# Patient Record
Sex: Female | Born: 1985 | Race: White | Hispanic: No | Marital: Single | Smoking: Current some day smoker
Health system: Southern US, Community
[De-identification: ages and names within clinical notes are randomized; demographics above are authoritative.]

## PROBLEM LIST (undated history)

## (undated) ENCOUNTER — Inpatient Hospital Stay (HOSPITAL_COMMUNITY): Payer: Self-pay

## (undated) DIAGNOSIS — F329 Major depressive disorder, single episode, unspecified: Secondary | ICD-10-CM

## (undated) DIAGNOSIS — J40 Bronchitis, not specified as acute or chronic: Secondary | ICD-10-CM

## (undated) DIAGNOSIS — N921 Excessive and frequent menstruation with irregular cycle: Secondary | ICD-10-CM

## (undated) DIAGNOSIS — O163 Unspecified maternal hypertension, third trimester: Secondary | ICD-10-CM

## (undated) DIAGNOSIS — N390 Urinary tract infection, site not specified: Secondary | ICD-10-CM

## (undated) DIAGNOSIS — O99320 Drug use complicating pregnancy, unspecified trimester: Secondary | ICD-10-CM

## (undated) DIAGNOSIS — R102 Pelvic and perineal pain: Secondary | ICD-10-CM

## (undated) DIAGNOSIS — Z8742 Personal history of other diseases of the female genital tract: Secondary | ICD-10-CM

## (undated) DIAGNOSIS — F32A Depression, unspecified: Secondary | ICD-10-CM

## (undated) DIAGNOSIS — K219 Gastro-esophageal reflux disease without esophagitis: Secondary | ICD-10-CM

## (undated) DIAGNOSIS — IMO0002 Reserved for concepts with insufficient information to code with codable children: Secondary | ICD-10-CM

## (undated) DIAGNOSIS — F1910 Other psychoactive substance abuse, uncomplicated: Secondary | ICD-10-CM

## (undated) DIAGNOSIS — N809 Endometriosis, unspecified: Secondary | ICD-10-CM

## (undated) DIAGNOSIS — N946 Dysmenorrhea, unspecified: Secondary | ICD-10-CM

## (undated) HISTORY — DX: Pelvic and perineal pain: R10.2

## (undated) HISTORY — DX: Drug use complicating pregnancy, unspecified trimester: O99.320

## (undated) HISTORY — DX: Unspecified maternal hypertension, third trimester: O16.3

## (undated) HISTORY — DX: Personal history of other diseases of the female genital tract: Z87.42

## (undated) HISTORY — DX: Dysmenorrhea, unspecified: N94.6

## (undated) HISTORY — DX: Other psychoactive substance abuse, uncomplicated: F19.10

## (undated) HISTORY — DX: Excessive and frequent menstruation with irregular cycle: N92.1

---
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
## 2014-01-25 DIAGNOSIS — IMO0002 Reserved for concepts with insufficient information to code with codable children: Secondary | ICD-10-CM

## 2014-01-25 HISTORY — DX: Reserved for concepts with insufficient information to code with codable children: IMO0002

## 2014-04-19 ENCOUNTER — Encounter (HOSPITAL_COMMUNITY): Payer: Self-pay | Admitting: Emergency Medicine

## 2014-04-19 ENCOUNTER — Emergency Department (HOSPITAL_COMMUNITY)
Admission: EM | Admit: 2014-04-19 | Discharge: 2014-04-19 | Disposition: A | Discharge status: Home / Self Care | Payer: Medicaid Other | Attending: Emergency Medicine | Admitting: Emergency Medicine

## 2014-04-19 DIAGNOSIS — Z720 Tobacco use: Secondary | ICD-10-CM | POA: Diagnosis not present

## 2014-04-19 DIAGNOSIS — R102 Pelvic and perineal pain: Secondary | ICD-10-CM | POA: Insufficient documentation

## 2014-04-19 DIAGNOSIS — Z8742 Personal history of other diseases of the female genital tract: Secondary | ICD-10-CM | POA: Insufficient documentation

## 2014-04-19 DIAGNOSIS — Z91040 Latex allergy status: Secondary | ICD-10-CM | POA: Diagnosis not present

## 2014-04-19 DIAGNOSIS — Z8719 Personal history of other diseases of the digestive system: Secondary | ICD-10-CM | POA: Diagnosis not present

## 2014-04-19 DIAGNOSIS — Z3202 Encounter for pregnancy test, result negative: Secondary | ICD-10-CM | POA: Insufficient documentation

## 2014-04-19 HISTORY — DX: Gastro-esophageal reflux disease without esophagitis: K21.9

## 2014-04-19 HISTORY — DX: Endometriosis, unspecified: N80.9

## 2014-04-19 LAB — CBC WITH DIFFERENTIAL/PLATELET
BASOS ABS: 0 10*3/uL (ref 0.0–0.1)
Basophils Relative: 0 % (ref 0–1)
EOS ABS: 0.1 10*3/uL (ref 0.0–0.7)
Eosinophils Relative: 0 % (ref 0–5)
HCT: 42.8 % (ref 36.0–46.0)
Hemoglobin: 14.8 g/dL (ref 12.0–15.0)
Lymphocytes Relative: 30 % (ref 12–46)
Lymphs Abs: 3.5 10*3/uL (ref 0.7–4.0)
MCH: 31.2 pg (ref 26.0–34.0)
MCHC: 34.6 g/dL (ref 30.0–36.0)
MCV: 90.3 fL (ref 78.0–100.0)
Monocytes Absolute: 1 10*3/uL (ref 0.1–1.0)
Monocytes Relative: 8 % (ref 3–12)
NEUTROS PCT: 62 % (ref 43–77)
Neutro Abs: 7.1 10*3/uL (ref 1.7–7.7)
PLATELETS: 290 10*3/uL (ref 150–400)
RBC: 4.74 MIL/uL (ref 3.87–5.11)
RDW: 13.2 % (ref 11.5–15.5)
WBC: 11.6 10*3/uL — AB (ref 4.0–10.5)

## 2014-04-19 LAB — COMPREHENSIVE METABOLIC PANEL
ALBUMIN: 4.7 g/dL (ref 3.5–5.2)
ALT: 15 U/L (ref 0–35)
ANION GAP: 11 (ref 5–15)
AST: 19 U/L (ref 0–37)
Alkaline Phosphatase: 66 U/L (ref 39–117)
BUN: 21 mg/dL (ref 6–23)
CHLORIDE: 103 mmol/L (ref 96–112)
CO2: 24 mmol/L (ref 19–32)
Calcium: 8.8 mg/dL (ref 8.4–10.5)
Creatinine, Ser: 1.26 mg/dL — ABNORMAL HIGH (ref 0.50–1.10)
GFR calc non Af Amer: 58 mL/min — ABNORMAL LOW (ref >90)
GFR, EST AFRICAN AMERICAN: 67 mL/min — AB (ref >90)
GLUCOSE: 75 mg/dL (ref 70–99)
Potassium: 3.7 mmol/L (ref 3.5–5.1)
Sodium: 138 mmol/L (ref 135–145)
TOTAL PROTEIN: 8 g/dL (ref 6.0–8.3)
Total Bilirubin: 0.7 mg/dL (ref 0.3–1.2)

## 2014-04-19 LAB — URINALYSIS, ROUTINE W REFLEX MICROSCOPIC
Bilirubin Urine: NEGATIVE
Glucose, UA: NEGATIVE mg/dL
HGB URINE DIPSTICK: NEGATIVE
Ketones, ur: NEGATIVE mg/dL
Leukocytes, UA: NEGATIVE
Nitrite: NEGATIVE
PROTEIN: NEGATIVE mg/dL
Specific Gravity, Urine: 1.026 (ref 1.005–1.030)
Urobilinogen, UA: 0.2 mg/dL (ref 0.0–1.0)
pH: 5.5 (ref 5.0–8.0)

## 2014-04-19 LAB — WET PREP, GENITAL
Trich, Wet Prep: NONE SEEN
Yeast Wet Prep HPF POC: NONE SEEN

## 2014-04-19 LAB — POC URINE PREG, ED: PREG TEST UR: NEGATIVE

## 2014-04-19 LAB — LIPASE, BLOOD: Lipase: 30 U/L (ref 11–59)

## 2014-04-19 MED ORDER — IBUPROFEN 800 MG PO TABS: 800.0000 mg | ORAL_TABLET | Freq: Three times a day (TID) | ORAL | Status: DC

## 2014-04-19 MED ORDER — OXYCODONE-ACETAMINOPHEN 5-325 MG PO TABS: 2.0000 | ORAL_TABLET | ORAL | Status: DC | PRN

## 2014-04-19 NOTE — ED Notes (Signed)
Pt with Hx of endometriosis c/o lower abdominal pain flare up x several days, states pain is too much to handle currently.

## 2014-04-19 NOTE — Progress Notes (Signed)
  CARE MANAGEMENT ED NOTE 04/19/2014  Patient:  Joyce Ward,Joyce Ward   Account Number:  [id number]  Date Initiated:  04/19/2014  Documentation initiated by:  Edd ArbourGIBBS,Meer Reindl  Subjective/Objective Assessment:   29 yr [place] with Hx of endometriosis c/o lower abdominal pain flare up x several days, states pain is too much to handle currently.                  Subjective/Objective Assessment Detail:   no pcp listed  Pt [place] Reports making [place] DSS aware of her move and [place] already [place] booklet of [place] providers to choose from     Action/Plan:   CM spoke with pt about insurance coverage, pcps, [place] resources see notes below   Action/Plan Detail:   Anticipated DC Date:  04/19/2014     Status Recommendation to Physician:   Result of Recommendation:    Other ED Services  Consult Working Plan    DC Planning Services  Other  Outpatient Services - Pt will follow up  PCP issues    Choice offered to / List presented to:            Status of service:  Completed, signed off  ED Comments:   ED Comments Detail:  CM spoke with pt who [place]cp. CM discussed and provided written information for self pay pcps, importance of pcp for f/u care, www.needymeds.org, www.goodrx.com, discounted pharmacies and other [place] resources such as [facility], [employer], affordable care act,  financial assistance, DSS and  health department  Reviewed resources for [place] self pay pcps like [facility]ovita [facility] [place], family medicine at [address], [facility], general medical clinics, [facility] urgent care plus others, medication resources, [facility] out patient pharmacies and housing Pt voiced understanding and appreciation of resources provided

## 2014-04-19 NOTE — Discharge Instructions (Signed)
Pelvic Pain Female pelvic pain can be caused by many different things and start from a variety of places. Pelvic pain refers to pain that is located in the lower half of the abdomen and between your hips. The pain may occur over a short period of time (acute) or may be reoccurring (chronic). The cause of pelvic pain may be related to disorders affecting the female reproductive organs (gynecologic), but it may also be related to the bladder, kidney stones, an intestinal complication, or muscle or skeletal problems. Getting help right away for pelvic pain is important, especially if there has been severe, sharp, or a sudden onset of unusual pain. It is also important to get help right away because some types of pelvic pain can be life threatening.  CAUSES  Below are only some of the causes of pelvic pain. The causes of pelvic pain can be in one of several categories.   Gynecologic.  Pelvic inflammatory disease.  Sexually transmitted infection.  Ovarian cyst or a twisted ovarian ligament (ovarian torsion).  Uterine lining that grows outside the uterus (endometriosis).  Fibroids, cysts, or tumors.  Ovulation.  Pregnancy.  Pregnancy that occurs outside the uterus (ectopic pregnancy).  Miscarriage.  Labor.  Abruption of the placenta or ruptured uterus.  Infection.  Uterine infection (endometritis).  Bladder infection.  Diverticulitis.  Miscarriage related to a uterine infection (septic abortion).  Bladder.  Inflammation of the bladder (cystitis).  Kidney stone(s).  Gastrointestinal.  Constipation.  Diverticulitis.  Neurologic.  Trauma.  Feeling pelvic pain because of mental or emotional causes (psychosomatic).  Cancers of the bowel or pelvis. EVALUATION  Your caregiver will want to take a careful history of your concerns. This includes recent changes in your health, a careful gynecologic history of your periods (menses), and a sexual history. Obtaining your family  history and medical history is also important. Your caregiver may suggest a pelvic exam. A pelvic exam will help identify the location and severity of the pain. It also helps in the evaluation of which organ system may be involved. In order to identify the cause of the pelvic pain and be properly treated, your caregiver may order tests. These tests may include:   A pregnancy test.  Pelvic ultrasonography.  An X-ray exam of the abdomen.  A urinalysis or evaluation of vaginal discharge.  Blood tests. HOME CARE INSTRUCTIONS   Only take over-the-counter or prescription medicines for pain, discomfort, or fever as directed by your caregiver.   Rest as directed by your caregiver.   Eat a balanced diet.   Drink enough fluids to make your urine clear or pale yellow, or as directed.   Avoid sexual intercourse if it causes pain.   Apply warm or cold compresses to the lower abdomen depending on which one helps the pain.   Avoid stressful situations.   Keep a journal of your pelvic pain. Write down when it started, where the pain is located, and if there are things that seem to be associated with the pain, such as food or your menstrual cycle.  Follow up with your caregiver as directed.  SEEK MEDICAL CARE IF:  Your medicine does not help your pain.  You have abnormal vaginal discharge. SEEK IMMEDIATE MEDICAL CARE IF:   You have heavy bleeding from the vagina.   Your pelvic pain increases.   You feel light-headed or faint.   You have chills.   You have pain with urination or blood in your urine.   You have uncontrolled diarrhea   or vomiting.   °· You have a fever or persistent symptoms for more than 3 days. °· You have a fever and your symptoms suddenly get worse.   °· You are being physically or sexually abused.   °MAKE SURE YOU: °· Understand these instructions. °· Will watch your condition. °· Will get help if you are not doing well or get worse. °Document Released:  12/09/2003 Document Revised: 05/28/2013 Document Reviewed: 05/03/2011 °ExitCare® Patient Information ©2015 [employer]. This information is not intended to replace advice given to you by your health care provider. Make sure you discuss any questions you have with your health care provider. ° °Emergency Department Resource Guide °1) Find a Doctor and Pay Out of Pocket °Although you won't have to find out who is covered by your insurance plan, it is a good idea to ask around and get recommendations. You will then need to call the office and see if the doctor you have chosen will accept you as a new patient and what types of options they offer for patients who are self-pay. Some doctors offer discounts or will set up payment plans for their patients who do not have insurance, but you will need to ask so you aren't surprised when you get to your appointment. ° °2) Contact Your Local Health Department °Not all health departments have doctors that can see patients for sick visits, but many do, so it is worth a call to see if yours does. If you don't know where your local health department is, you can check in your phone book. The CDC also has a tool to help you locate your state's health department, and many state websites also have listings of all of their local health departments. ° °3) Find a Walk-in Clinic °If your illness is not likely to be very severe or complicated, you may want to try a walk in clinic. These are popping up all over the country in pharmacies, drugstores, and shopping centers. They're usually staffed by nurse practitioners or physician assistants that have been trained to treat common illnesses and complaints. They're usually fairly quick and inexpensive. However, if you have serious medical issues or chronic medical problems, these are probably not your best option. ° °No Primary Care Doctor: °- Call Health Connect at  832-8000 - they can help you locate a primary care doctor that  accepts your  insurance, provides certain services, etc. °- Physician Referral Service- 1-800-533-3463 ° °Chronic Pain Problems: °Organization         Address  Phone   Notes  °[facility]  ([phone] Patients need to be referred by their primary care doctor.  ° °Medication Assistance: °Organization         Address  Phone   Notes  °[place] [facility] [address] °[place], [place] [address] [phone]) [phone] --Must be a resident of [place] °-- Must have NO insurance coverage whatsoever (no Medicaid/ Medicare, etc.) °-- The pt. MUST have a primary care doctor that directs their care regularly and follows them in the community °  °MedAssist  (866) 331-1348   °United Way  (888) 892-1162   ° °Agencies that provide inexpensive medical care: °Organization         Address  Phone   Notes  °[facility]  (336) [phone]   °[facility]    ([phone]) [phone]   °[facility] [address] °[place], [place] [address] [phone]) [phone]   °  [facility] [address] [place] [phone]   Planned Parenthood    (480)622-0091   [facility]    [phone]   [place] [address], [place] Phone:  [phone], Fax:  [phone] Hours of Operation:  9 am - 6 pm, M-F.  Also accepts Medicaid/Medicare and self-pay.  [facility] for Center [place], [address], [place] Phone: [phone], Fax: [phone]. Hours of Operation:  8:30 am - 5:30 pm, M-F.  Also accepts Medicaid and self-pay.  [facility] [place] [address] [place] Phone: [phone]   [place], [place], [place] [phone], Ext. [phone] Mondays & Thursdays: 7-9 AM.  First 15 patients are seen on a first come, first serve basis.    [place] Providers:  Organization          Address  Phone   Notes  [facility] [address],  [phone] Also accepts self-pay patients.  [facility] [address], [place]  [phone]   [place], [address], [place] [phone]   [facility] [address], [place] [phone]   Lucianne Lei [address], [place]   [phone] Only accepts [place] Access [place] patients after they have their name applied to their card.   Self-Pay (no insurance) in [facility]:  Organization         Address  Phone   Notes  Sickle Cell Patients, [facility] [place] [phone]   [facility] [place] [phone]   [facility] [place]  [place], [place], [place] [phone]   [facility]/Dr. Osei-Bonsu  [address] [place] or [address], [place] [phone] Phone number for both [place] and [place] locations is the same.  [facility] [address] [place] [phone]   [facility] [address] [place] or [address] [phone]   [facility] [address] [place] [phone], phone; [phone], fax Sees patients 1st and 3rd Saturday of every month.  Must not qualify for public or private insurance (i.e. Medicaid, Medicare, [place] Health Choice, Veterans' Benefits)  Household income should be no more than 200% of the poverty level The clinic cannot treat you if you are pregnant or think you are pregnant  Sexually transmitted diseases are not treated at the clinic.    Dental Care: Organization         Address  Phone  Notes  [facility] Department of [facility] [phone] Accepts children up to age 63 who are enrolled in [place] or [place]; pregnant women with a Medicaid card; and children who have applied for Medicaid or [place] Health Choice, but were declined, whose parents can pay a reduced fee at time of service.  [facility] Department of [facility]  [address], [place] ([phone] Accepts children up to age 25 who are enrolled in [place] or [place]; pregnant women with a Medicaid card; and  children who have applied for Medicaid or [place] Health Choice, but were declined, whose parents can pay a reduced fee at time of service.  [place] [facility] PRO[facility]  [place] [phone] Patients are seen by appointment only. Walk-ins are not accepted. [place] will see patients 64 years of age and older. Monday - Tuesday (8am-5pm) Most Wednesdays (8:30-5pm) $30 per visit, cash only  [facility] Adult Dental Access PROGRAM  [address], [facility] [phone] Patients are seen by appointment only. Walk-ins are not accepted. [place] will see patients 81 years of age and older. One Wednesday Evening (Monthly: Volunteer Based).  $30 per visit, cash only  [place]  [phone] for adults; Children under age 25, call Graduate Pediatric Dentistry at [phone]. Children aged 30-14, please call [phone] to request a pediatric application.  Dental services are provided in all areas of dental care including fillings, crowns and bridges, complete and partial dentures, implants, gum treatment, root canals, and extractions. Preventive care is also provided. Treatment is provided to both adults and children. Patients are selected via a lottery and there is often a waiting list.   [facility] [address], [place]  [phone] [email or web address]   [facility] [address] [place], [place]  [phone], Ext. [phone] Second and Fourth Thursday of each month, opens at 6:30 AM; Clinic ends at 9 AM.  Patients are seen on a first-come first-served basis, and a limited number are seen during each clinic.   [facility]  [address] Hillard Danker [place], [place] [phone]   Eligibility Requirements You must have lived in [place], [place], or [place] counties for at least the last three months.   You cannot be eligible for state or federal sponsored [employer], including [employer], [place], or [employer].   You generally cannot be eligible for healthcare insurance through your employer.    How to apply: Eligibility screenings are held every Tuesday and Wednesday afternoon from 1:00 pm until 4:00 pm. You do not need an appointment for the interview!  [facility] [address] [place], [place]   [place]  [place] Department  [place]  [phone]    Behavioral Health Resources in the Community: Intensive Outpatient Programs Organization         Address  Phone  Notes  Vass Berger. [address] [place], [place] [phone]   [facility] Out[facility] [address], [place], [place]   [facility]: [facility] [address] [place], [place]   [place] [address]  [place], [place] or [phone]   Substance Abuse Resources Organization         Address  Phone  Notes  Alcohol and Drug Services  484-667-2026   [place]  [phone]   [facility] [place]   Chinita Pester  [phone]   Residential & Outpatient Substance Abuse Program  919-177-9951   Psychological Services Organization         Address  Phone  Notes  [facility]  [place]  [phone]    [place] [address], [place] or [phone]    Mobile Crisis Teams Organization  Address  Phone  Notes  [facility], Mobile Crisis Care Unit  [phone]   [facility]  [address] [place], [place] [phone]   [facility]sch [address] [place] [phone]    Self-Help/Support Groups Organization         Address  Phone             Notes  [facility] [place] - variety of support groups  336- [phone] Call for more information  Narcotics Anonymous (NA), [facility] [address], [place]  2 meetings at this location   Statisticianesidential Treatment Programs Organization         Address  Phone  Notes  [facility] [address],    [place]  [phone]   [facility]  [address], [place] [phone], [place], [place] [phone]   [facility] [address], [place] [phone] Admissions: 8am-3pm M-F  [facility] [address]    [place], [place] [phone]   [facility] [address], [place], [place] [phone]   [facility] [address]  [place], [place] [phone]   [facility] - Intensive Outpatient [address], [place], [place] [phone]   [facility] ([facility] [address]  [place], [place] [phone] or [phone]   [facility] ([facility]) [address] [place], [place] [phone] Accepts Medicaid  [facility] [address]  [place] [phone] Substance Abuse/Addiction Treatment   [facility] Behavioral Health Resources Organization         Address  Phone  Notes  [facility]  [phone]) [phone]   Angie FavaJulie Brannon, PhD [address] [place], [place]   [phone] or [phone]) [phone]   [facility] [place]   [address] [place], [place] [phone]   [facility] [address], [place], [place] [phone]) [phone] Insurance/Medicaid/sponsorship through [facility]  [facility] [address]                                    [place], [place] [phone]) [phone] Therapy/tele-psych/case  [facility] [address].   [place], [place] [phone]    Dr. Lolly MustacheArfeen  [phone]   [facility] [place]  [employer] [facility] 1) [address], [place] 2) [address], [place] 3)  [address], [place] [phone]  [phone](336) [phone]   [facility] Child Abuse Hotline 4094591113(336) [phone] or [phone](336) [phone] (After Hours)     Follow up with GYN for endometriosis. Return for worsening symptoms.

## 2014-04-19 NOTE — ED Provider Notes (Signed)
CSN: [id number]     Arrival date & time 04/19/14  1103 History   First MD Initiated Contact with Patient 04/19/14 1255     Chief Complaint  Patient presents with  . Pelvic Pain     (Consider location/radiation/quality/duration/timing/severity/associated sxs/prior Treatment) Patient is a 29 y.o. female presenting with pelvic pain. The history is provided by the patient. No language interpreter was used.  Pelvic Pain  Joyce Ward is a 29 y.o white female with a history of endometriosis and 2 laproscopic ablations who presents for pelvic pain that began gradually 4 days ago.  She states this is similar to her usual endometriosis pain but she can no longer take the pain.  Pain is 10/10. She has tried ibuprofen and advil for the pain with little relief.  She does not have a gyn and has not had a pelvic exam in several years as far as she can remember. She denies any history of STD's or new sexual partners.  Her LMP was at the beginning of March.   Past Medical History  Diagnosis Date  . Endometriosis   . GERD (gastroesophageal reflux disease)    History reviewed. No pertinent past surgical history. History reviewed. No pertinent family history. History  Substance Use Topics  . Smoking status: Current Some Day Smoker    Types: Cigarettes  . Smokeless tobacco: Not on file  . Alcohol Use: Yes     Comment: occasional   OB History    No data available     Review of Systems  Constitutional: Negative.   Genitourinary: Positive for pelvic pain. Negative for dysuria, frequency and hematuria.  All other systems reviewed and are negative.     Allergies  Codeine; Latex; and Tramadol  Home Medications   Prior to Admission medications   Medication Sig Start Date End Date Taking? Authorizing Provider  ibuprofen (ADVIL,MOTRIN) 800 MG tablet Take 1 tablet (800 mg total) by mouth 3 (three) times daily. 04/19/14   Maryclaire Stoecker Patel-Mills, PA-C  oxyCODONE-acetaminophen (PERCOCET/ROXICET) 5-325 MG  per tablet Take 2 tablets by mouth every 4 (four) hours as needed for severe pain. 04/19/14   Cristianna Cyr Patel-Mills, PA-C   BP 122/76 mmHg  Pulse 77  Temp(Src) 98.2 F (36.8 C) (Oral)  Resp 16  SpO2 98%  LMP 03/07/2014 Physical Exam  Constitutional: She is oriented to person, place, and time. She appears well-developed and well-nourished.  HENT:  Head: Normocephalic and atraumatic.  Eyes: Conjunctivae are normal.  Neck: Normal range of motion. Neck supple.  Cardiovascular: Normal rate, regular rhythm and normal heart sounds.   Pulmonary/Chest: Effort normal and breath sounds normal.  Abdominal: Soft. There is no tenderness.  Genitourinary:  Pelvic exam: Chaperone present No vaginal bleeding or discharge.  No endometriotic lesions noted on the vaginal wall. Bilateral adnexal tenderness to palpation.   Musculoskeletal: Normal range of motion.  Neurological: She is alert and oriented to person, place, and time.  Skin: Skin is warm and dry.  Nursing note and vitals reviewed.   ED Course  Procedures (including critical care time) Labs Review Labs Reviewed  WET PREP, GENITAL - Abnormal; Notable for the following:    Clue Cells Wet Prep HPF POC FEW (*)    WBC, Wet Prep HPF POC FEW (*)    All other components within normal limits  CBC WITH DIFFERENTIAL/PLATELET - Abnormal; Notable for the following:    WBC 11.6 (*)    All other components within normal limits  COMPREHENSIVE METABOLIC PANEL - Abnormal;  Notable for the following:    Creatinine, Ser 1.26 (*)    GFR calc non Af Amer 58 (*)    GFR calc Af Amer 67 (*)    All other components within normal limits  URINALYSIS, ROUTINE W REFLEX MICROSCOPIC - Abnormal; Notable for the following:    APPearance CLOUDY (*)    All other components within normal limits  LIPASE, BLOOD  POC URINE PREG, ED  GC/CHLAMYDIA PROBE AMP ([place])    Imaging Review No results found.   EKG Interpretation None      MDM   Final diagnoses:   Pelvic pain in female   Patient presents for pelvic pain that is similar to her endometriosis pain.  On pelvic exam she has bilateral adnexal tenderness to palpation.  No UTI.  Creatinine is 1.26.  All other labs are normal. Her vitals are stable. She is not pregnant and has no infection.  Her pain is bilateral so ovarian torsion was low on my differential.  This is similar to her previous endometriosis pain.  She is driving so I gave her ibuprofen here and wrote her for percocet and ibuprofen to go home with.  I gave her the resource guide for f/u and recommended seeing GYN for both pap and endometriosis pain.  She agrees with the plan.      Catha Gosselin, PA-C 04/20/14 1026  Tilden Fossa, MD 04/20/14 [phone]

## 2014-04-22 LAB — GC/CHLAMYDIA PROBE AMP (~~LOC~~) NOT AT ARMC
CHLAMYDIA, DNA PROBE: NEGATIVE
Neisseria Gonorrhea: NEGATIVE

## 2014-04-27 ENCOUNTER — Emergency Department (HOSPITAL_COMMUNITY)
Admission: EM | Admit: 2014-04-27 | Discharge: 2014-04-27 | Disposition: A | Discharge status: Home / Self Care | Payer: Medicaid Other | Attending: Emergency Medicine | Admitting: Emergency Medicine

## 2014-04-27 ENCOUNTER — Encounter (HOSPITAL_COMMUNITY): Payer: Self-pay | Admitting: *Deleted

## 2014-04-27 DIAGNOSIS — Z3202 Encounter for pregnancy test, result negative: Secondary | ICD-10-CM | POA: Insufficient documentation

## 2014-04-27 DIAGNOSIS — N809 Endometriosis, unspecified: Secondary | ICD-10-CM | POA: Insufficient documentation

## 2014-04-27 DIAGNOSIS — Z91040 Latex allergy status: Secondary | ICD-10-CM | POA: Insufficient documentation

## 2014-04-27 DIAGNOSIS — Z8719 Personal history of other diseases of the digestive system: Secondary | ICD-10-CM | POA: Diagnosis not present

## 2014-04-27 DIAGNOSIS — R102 Pelvic and perineal pain: Secondary | ICD-10-CM | POA: Diagnosis not present

## 2014-04-27 DIAGNOSIS — Z720 Tobacco use: Secondary | ICD-10-CM | POA: Diagnosis not present

## 2014-04-27 LAB — URINALYSIS, ROUTINE W REFLEX MICROSCOPIC
BILIRUBIN URINE: NEGATIVE
Glucose, UA: NEGATIVE mg/dL
Hgb urine dipstick: NEGATIVE
KETONES UR: 15 mg/dL — AB
Leukocytes, UA: NEGATIVE
NITRITE: NEGATIVE
PROTEIN: NEGATIVE mg/dL
Specific Gravity, Urine: 1.024 (ref 1.005–1.030)
Urobilinogen, UA: 0.2 mg/dL (ref 0.0–1.0)
pH: 6 (ref 5.0–8.0)

## 2014-04-27 LAB — POC URINE PREG, ED: Preg Test, Ur: NEGATIVE

## 2014-04-27 MED ORDER — OXYCODONE-ACETAMINOPHEN 5-325 MG PO TABS: 1.0000 | ORAL_TABLET | ORAL | Status: DC | PRN

## 2014-04-27 MED ORDER — KETOROLAC TROMETHAMINE 60 MG/2ML IM SOLN
60.0000 mg | Freq: Once | INTRAMUSCULAR | Status: AC
  Administered 2014-04-27: 60 mg via INTRAMUSCULAR
  Filled 2014-04-27: qty 2

## 2014-04-27 MED ORDER — IBUPROFEN 800 MG PO TABS: 800.0000 mg | ORAL_TABLET | Freq: Three times a day (TID) | ORAL | Status: DC

## 2014-04-27 NOTE — ED Notes (Signed)
PT ambulated with baseline gait; VSS; A&Ox3; no signs of distress; respirations even and unlabored; skin warm and dry; no questions upon discharge.  

## 2014-04-27 NOTE — ED Notes (Signed)
PA at bedside.

## 2014-04-27 NOTE — ED Notes (Signed)
Pt has bilateral pelvic pain; states has had surgeries in past for endometriosis and this feels like a flair. Has been taking ibuprofen

## 2014-04-27 NOTE — Discharge Instructions (Signed)
Take the prescribed medication as directed. [facility] you may call and schedule appt or you may go in through the ER there.

## 2014-04-27 NOTE — ED Provider Notes (Signed)
CSN: [id number]     Arrival date & time 04/27/14  8295 History   First MD Initiated Contact with Patient 04/27/14 [phone]     Chief Complaint  Patient presents with  . Pelvic Pain     (Consider location/radiation/quality/duration/timing/severity/associated sxs/prior Treatment) Patient is a 29 y.o. female presenting with pelvic pain. The history is provided by the patient and medical records.  Pelvic Pain  This is a 29 y.o. F with PMH significant for endometriosis, GERD, presenting to the ED for pelvic pain. Patient states she was seen at  approx 1 week ago for the same and has been taking the meds but continues having pain.  Patient has a long-standing history of endometriosis and has required to laparoscopic surgeries in the past for this. She states pain is localized across her entire abdomen, but worse in the suprapubic region. She states pain is similar to prior flares, but is somewhat worse than normal. She denies any associated nausea, vomiting, or diarrhea. No urinary symptoms or vaginal complaints. Patient is new to [facility] and is waiting for her change in [place] prior to seeing an OB-GYN.  No fever, chills, sweats.  Past Medical History  Diagnosis Date  . Endometriosis   . GERD (gastroesophageal reflux disease)    History reviewed. No pertinent past surgical history. History reviewed. No pertinent family history. History  Substance Use Topics  . Smoking status: Current Some Day Smoker    Types: Cigarettes  . Smokeless tobacco: Not on file  . Alcohol Use: Yes     Comment: occasional   OB History    No data available     Review of Systems  Genitourinary: Positive for pelvic pain.  All other systems reviewed and are negative.     Allergies  Codeine; Latex; and Tramadol  Home Medications   Prior to Admission medications   Medication Sig Start Date End Date Taking? Authorizing Provider  ibuprofen (ADVIL,MOTRIN) 800 MG tablet Take 1 tablet (800 mg total) by  mouth 3 (three) times daily. 04/19/14   Hanna Patel-Mills, PA-C  oxyCODONE-acetaminophen (PERCOCET/ROXICET) 5-325 MG per tablet Take 2 tablets by mouth every 4 (four) hours as needed for severe pain. 04/19/14   Hanna Patel-Mills, PA-C   BP 116/77 mmHg  Pulse 123  Temp(Src) 99.1 F (37.3 C) (Oral)  Resp 18  SpO2 100%  LMP 03/19/2014   Physical Exam  Constitutional: She is oriented to person, place, and time. She appears well-developed and well-nourished. No distress.  HENT:  Head: Normocephalic and atraumatic.  Mouth/Throat: Oropharynx is clear and moist.  Eyes: Conjunctivae and EOM are normal. Pupils are equal, round, and reactive to light.  Neck: Normal range of motion. Neck supple.  Cardiovascular: Normal rate, regular rhythm and normal heart sounds.   Pulmonary/Chest: Effort normal and breath sounds normal. No respiratory distress. She has no wheezes.  Abdominal: Soft. Bowel sounds are normal. There is tenderness in the suprapubic area. There is no guarding and no CVA tenderness.    Abdomen soft, non-distended, tenderness of suprapubic and pelvic regions without rebound or guarding; no peritonitis  Musculoskeletal: Normal range of motion. She exhibits no edema.  Neurological: She is alert and oriented to person, place, and time.  Skin: Skin is warm and dry. She is not diaphoretic.  Psychiatric: She has a normal mood and affect.  Nursing note and vitals reviewed.   ED Course  Procedures (including critical care time) Labs Review Labs Reviewed  URINALYSIS, ROUTINE W REFLEX MICROSCOPIC - Abnormal; Notable for  the following:    Color, Urine AMBER (*)    Ketones, ur 15 (*)    All other components within normal limits  POC URINE PREG, ED    Imaging Review No results found.   EKG Interpretation None      MDM   Final diagnoses:  Pelvic pain in female   29 year old female with ongoing pelvic pain secondary to her endometriosis. She was seen in the ED 1 week ago and has  been taking pain meds without significant improvement. On exam, she is afebrile and nontoxic in appearance. She does have tenderness of her suprapubic and pelvic regions without peritoneal signs. I have reviewed cultures from pelvic exam, negative for acute infection. Urine pregnancy test today is negative and UA is without signs of infection.  I have discussed possibility of obtaining pelvic ultrasound due to worsening pain, however patient states she would prefer to wait until she sees an OB/GYN to have this done. She is aware there may be abnormalities that require urgent intervention, however she still wishes to wait. She was given Toradol in the ED with significant improvement of her pain. She feels comfortable with discharge home, short supply of pain medications given. Patient was instructed [facility].  Discussed plan with patient, he/she acknowledged understanding and agreed with plan of care.  Return precautions given for new or worsening symptoms.  Garlon HatchetLisa M Temitope Griffing, PA-C 04/27/14 1025  Benjiman CoreNathan Pickering, MD 04/27/14 [phone]

## 2014-04-27 NOTE — ED Notes (Signed)
Pt reports lower abd pain/pelvic pain for days. Denies urinary or vaginal symptoms. Hx of endometriosis.

## 2014-05-08 ENCOUNTER — Telehealth (HOSPITAL_BASED_OUTPATIENT_CLINIC_OR_DEPARTMENT_OTHER): Payer: Self-pay | Admitting: Emergency Medicine

## 2014-05-08 ENCOUNTER — Emergency Department (HOSPITAL_COMMUNITY)
Admission: EM | Admit: 2014-05-08 | Discharge: 2014-05-08 | Disposition: A | Discharge status: Home / Self Care | Payer: Medicaid Other | Attending: Emergency Medicine | Admitting: Emergency Medicine

## 2014-05-08 DIAGNOSIS — Z8742 Personal history of other diseases of the female genital tract: Secondary | ICD-10-CM | POA: Insufficient documentation

## 2014-05-08 DIAGNOSIS — Z8719 Personal history of other diseases of the digestive system: Secondary | ICD-10-CM | POA: Diagnosis not present

## 2014-05-08 DIAGNOSIS — Z720 Tobacco use: Secondary | ICD-10-CM | POA: Insufficient documentation

## 2014-05-08 DIAGNOSIS — R102 Pelvic and perineal pain: Secondary | ICD-10-CM | POA: Diagnosis not present

## 2014-05-08 DIAGNOSIS — Z3202 Encounter for pregnancy test, result negative: Secondary | ICD-10-CM | POA: Diagnosis not present

## 2014-05-08 DIAGNOSIS — Z91040 Latex allergy status: Secondary | ICD-10-CM | POA: Diagnosis not present

## 2014-05-08 LAB — BASIC METABOLIC PANEL
Anion gap: 9 (ref 5–15)
BUN: 11 mg/dL (ref 6–23)
CALCIUM: 8.9 mg/dL (ref 8.4–10.5)
CO2: 23 mmol/L (ref 19–32)
Chloride: 107 mmol/L (ref 96–112)
Creatinine, Ser: 0.69 mg/dL (ref 0.50–1.10)
GFR calc Af Amer: >90 mL/min (ref >90)
GFR calc non Af Amer: >90 mL/min (ref >90)
GLUCOSE: 93 mg/dL (ref 70–99)
Potassium: 3.8 mmol/L (ref 3.5–5.1)
SODIUM: 139 mmol/L (ref 135–145)

## 2014-05-08 LAB — CBC WITH DIFFERENTIAL/PLATELET
Basophils Absolute: 0 10*3/uL (ref 0.0–0.1)
Basophils Relative: 0 % (ref 0–1)
EOS ABS: 0.1 10*3/uL (ref 0.0–0.7)
EOS PCT: 0 % (ref 0–5)
HCT: 42.4 % (ref 36.0–46.0)
Hemoglobin: 14.2 g/dL (ref 12.0–15.0)
LYMPHS ABS: 2.3 10*3/uL (ref 0.7–4.0)
Lymphocytes Relative: 18 % (ref 12–46)
MCH: 31.3 pg (ref 26.0–34.0)
MCHC: 33.5 g/dL (ref 30.0–36.0)
MCV: 93.4 fL (ref 78.0–100.0)
MONO ABS: 0.7 10*3/uL (ref 0.1–1.0)
MONOS PCT: 5 % (ref 3–12)
NEUTROS ABS: 9.9 10*3/uL — AB (ref 1.7–7.7)
NEUTROS PCT: 77 % (ref 43–77)
PLATELETS: 328 10*3/uL (ref 150–400)
RBC: 4.54 MIL/uL (ref 3.87–5.11)
RDW: 14.2 % (ref 11.5–15.5)
WBC: 13 10*3/uL — AB (ref 4.0–10.5)

## 2014-05-08 LAB — URINALYSIS, ROUTINE W REFLEX MICROSCOPIC
Glucose, UA: NEGATIVE mg/dL
HGB URINE DIPSTICK: NEGATIVE
KETONES UR: NEGATIVE mg/dL
Leukocytes, UA: NEGATIVE
Nitrite: NEGATIVE
Protein, ur: NEGATIVE mg/dL
Specific Gravity, Urine: 1.031 — ABNORMAL HIGH (ref 1.005–1.030)
UROBILINOGEN UA: 0.2 mg/dL (ref 0.0–1.0)
pH: 6 (ref 5.0–8.0)

## 2014-05-08 LAB — PREGNANCY, URINE: Preg Test, Ur: NEGATIVE

## 2014-05-08 MED ORDER — OXYCODONE-ACETAMINOPHEN 5-325 MG PO TABS
1.0000 | ORAL_TABLET | Freq: Once | ORAL | Status: AC
  Administered 2014-05-08: 1 via ORAL
  Filled 2014-05-08: qty 1

## 2014-05-08 MED ORDER — IBUPROFEN 800 MG PO TABS: 800.0000 mg | ORAL_TABLET | Freq: Three times a day (TID) | ORAL | Status: DC | PRN

## 2014-05-08 MED ORDER — OXYCODONE-ACETAMINOPHEN 5-325 MG PO TABS
1.0000 | ORAL_TABLET | Freq: Four times a day (QID) | ORAL | Status: DC | PRN
Start: 2014-05-08 — End: 2014-06-23

## 2014-05-08 NOTE — ED Provider Notes (Signed)
CSN: [id number]     Arrival date & time 05/08/14  1116 History   First MD Initiated Contact with Patient 05/08/14 1155     Chief Complaint  Patient presents with  . Pelvic Pain     (Consider location/radiation/quality/duration/timing/severity/associated sxs/prior Treatment) Patient is a 29 y.o. female presenting with pelvic pain. The history is provided by the patient (the pt is complaining of pelvic pain.  she has endometriosis and has an appointment with gyn in 1 month.  motrin and percocet help with the pain).  Pelvic Pain This is a recurrent problem. The current episode started more than 2 days ago. The problem occurs constantly. The problem has not changed since onset.Associated symptoms include abdominal pain. Pertinent negatives include no chest pain and no headaches. Nothing aggravates the symptoms. Relieved by: percocet and motrin. She has tried acetaminophen for the symptoms. The treatment provided moderate relief.    Past Medical History  Diagnosis Date  . Endometriosis   . GERD (gastroesophageal reflux disease)    No past surgical history on file. No family history on file. History  Substance Use Topics  . Smoking status: Current Some Day Smoker    Types: Cigarettes  . Smokeless tobacco: Not on file  . Alcohol Use: Yes     Comment: occasional   OB History    No data available     Review of Systems  Constitutional: Negative for appetite change and fatigue.  HENT: Negative for congestion, ear discharge and sinus pressure.   Eyes: Negative for discharge.  Respiratory: Negative for cough.   Cardiovascular: Negative for chest pain.  Gastrointestinal: Positive for abdominal pain. Negative for diarrhea.  Genitourinary: Positive for pelvic pain. Negative for frequency and hematuria.  Musculoskeletal: Negative for back pain.  Skin: Negative for rash.  Neurological: Negative for seizures and headaches.  Psychiatric/Behavioral: Negative for hallucinations.       Allergies  Codeine; Latex; and Tramadol  Home Medications   Prior to Admission medications   Medication Sig Start Date End Date Taking? Authorizing Provider  ibuprofen (ADVIL,MOTRIN) 800 MG tablet Take 1 tablet (800 mg total) by mouth every 8 (eight) hours as needed for moderate pain. 05/08/14   Bethann BerkshireJoseph Sri Clegg, MD  oxyCODONE-acetaminophen (PERCOCET) 5-325 MG per tablet Take 1 tablet by mouth every 6 (six) hours as needed. 05/08/14   Bethann BerkshireJoseph Grizel Vesely, MD   BP 110/71 mmHg  Pulse 82  Temp(Src) 98 F (36.7 C) (Oral)  Resp 18  SpO2 100%  LMP 03/19/2014 Physical Exam  Constitutional: She is oriented to person, place, and time. She appears well-developed.  HENT:  Head: Normocephalic.  Eyes: Conjunctivae and EOM are normal. No scleral icterus.  Neck: Neck supple. No thyromegaly present.  Cardiovascular: Normal rate and regular rhythm.  Exam reveals no gallop and no friction rub.   No murmur heard. Pulmonary/Chest: No stridor. She has no wheezes. She has no rales. She exhibits no tenderness.  Abdominal: She exhibits no distension. There is tenderness. There is no rebound.  Moderate tender throughout  Musculoskeletal: Normal range of motion. She exhibits no edema.  Lymphadenopathy:    She has no cervical adenopathy.  Neurological: She is oriented to person, place, and time. She exhibits normal muscle tone. Coordination normal.  Skin: No rash noted. No erythema.  Psychiatric: She has a normal mood and affect. Her behavior is normal.    ED Course  Procedures (including critical care time) Labs Review Labs Reviewed  CBC WITH DIFFERENTIAL/PLATELET - Abnormal; Notable for the following:  WBC 13.0 (*)    Neutro Abs 9.9 (*)    All other components within normal limits  URINALYSIS, ROUTINE W REFLEX MICROSCOPIC - Abnormal; Notable for the following:    Color, Urine AMBER (*)    APPearance CLOUDY (*)    Specific Gravity, Urine 1.031 (*)    Bilirubin Urine SMALL (*)    All other  components within normal limits  BASIC METABOLIC PANEL  PREGNANCY, URINE    Imaging Review No results found.   EKG Interpretation None      MDM   Final diagnoses:  Pelvic pain in female    Endometriosis and pelvic pain,  tx with percocet and motrin and follow up with gyn    Bethann Berkshire, MD 05/08/14 1435

## 2014-05-08 NOTE — ED Notes (Signed)
MD notified that patient is in pain .

## 2014-05-08 NOTE — ED Notes (Signed)
Pt states her endometriosis has been flaring up. Having pelvic pain, has an appointment [facility] may 2nd. She states she's been taking ibuprofen but can't stand the pain anymore.

## 2014-05-08 NOTE — Discharge Instructions (Signed)
Follow up with the ob-gyn md as planned

## 2014-05-27 ENCOUNTER — Encounter: Payer: Medicaid Other | Admitting: Obstetrics & Gynecology

## 2014-06-22 ENCOUNTER — Encounter (HOSPITAL_COMMUNITY): Payer: Self-pay | Admitting: Emergency Medicine

## 2014-06-22 ENCOUNTER — Emergency Department (HOSPITAL_COMMUNITY)
Admission: EM | Admit: 2014-06-22 | Discharge: 2014-06-23 | Disposition: A | Discharge status: Home / Self Care | Payer: Medicaid Other | Attending: Emergency Medicine | Admitting: Emergency Medicine

## 2014-06-22 DIAGNOSIS — Z3202 Encounter for pregnancy test, result negative: Secondary | ICD-10-CM | POA: Insufficient documentation

## 2014-06-22 DIAGNOSIS — R102 Pelvic and perineal pain: Secondary | ICD-10-CM | POA: Insufficient documentation

## 2014-06-22 DIAGNOSIS — Z8742 Personal history of other diseases of the female genital tract: Secondary | ICD-10-CM | POA: Insufficient documentation

## 2014-06-22 DIAGNOSIS — Z720 Tobacco use: Secondary | ICD-10-CM | POA: Insufficient documentation

## 2014-06-22 DIAGNOSIS — R000 Tachycardia, unspecified: Secondary | ICD-10-CM | POA: Insufficient documentation

## 2014-06-22 DIAGNOSIS — Z8719 Personal history of other diseases of the digestive system: Secondary | ICD-10-CM | POA: Insufficient documentation

## 2014-06-22 DIAGNOSIS — Z91040 Latex allergy status: Secondary | ICD-10-CM | POA: Diagnosis not present

## 2014-06-22 DIAGNOSIS — R109 Unspecified abdominal pain: Secondary | ICD-10-CM | POA: Diagnosis present

## 2014-06-22 LAB — CBC WITH DIFFERENTIAL/PLATELET
Basophils Absolute: 0 10*3/uL (ref 0.0–0.1)
Basophils Relative: 0 % (ref 0–1)
EOS PCT: 0 % (ref 0–5)
Eosinophils Absolute: 0 10*3/uL (ref 0.0–0.7)
HCT: 39.8 % (ref 36.0–46.0)
HEMOGLOBIN: 13.5 g/dL (ref 12.0–15.0)
Lymphocytes Relative: 48 % — ABNORMAL HIGH (ref 12–46)
Lymphs Abs: 4.6 10*3/uL — ABNORMAL HIGH (ref 0.7–4.0)
MCH: 32.1 pg (ref 26.0–34.0)
MCHC: 33.9 g/dL (ref 30.0–36.0)
MCV: 94.8 fL (ref 78.0–100.0)
MONO ABS: 0.5 10*3/uL (ref 0.1–1.0)
MONOS PCT: 5 % (ref 3–12)
Neutro Abs: 4.5 10*3/uL (ref 1.7–7.7)
Neutrophils Relative %: 47 % (ref 43–77)
Platelets: 389 10*3/uL (ref 150–400)
RBC: 4.2 MIL/uL (ref 3.87–5.11)
RDW: 13.8 % (ref 11.5–15.5)
WBC: 9.7 10*3/uL (ref 4.0–10.5)

## 2014-06-22 LAB — URINALYSIS, ROUTINE W REFLEX MICROSCOPIC
BILIRUBIN URINE: NEGATIVE
Glucose, UA: NEGATIVE mg/dL
Hgb urine dipstick: NEGATIVE
Ketones, ur: NEGATIVE mg/dL
LEUKOCYTES UA: NEGATIVE
NITRITE: NEGATIVE
Protein, ur: NEGATIVE mg/dL
Specific Gravity, Urine: 1.007 (ref 1.005–1.030)
Urobilinogen, UA: 0.2 mg/dL (ref 0.0–1.0)
pH: 7 (ref 5.0–8.0)

## 2014-06-22 LAB — COMPREHENSIVE METABOLIC PANEL
ALBUMIN: 4.7 g/dL (ref 3.5–5.0)
ALT: 15 U/L (ref 14–54)
AST: 14 U/L — ABNORMAL LOW (ref 15–41)
Alkaline Phosphatase: 61 U/L (ref 38–126)
Anion gap: 7 (ref 5–15)
BUN: 10 mg/dL (ref 6–20)
CHLORIDE: 108 mmol/L (ref 101–111)
CO2: 26 mmol/L (ref 22–32)
CREATININE: 0.84 mg/dL (ref 0.44–1.00)
Calcium: 9.1 mg/dL (ref 8.9–10.3)
GFR calc non Af Amer: >60 mL/min (ref >60)
Glucose, Bld: 90 mg/dL (ref 65–99)
Potassium: 4 mmol/L (ref 3.5–5.1)
Sodium: 141 mmol/L (ref 135–145)
TOTAL PROTEIN: 7.6 g/dL (ref 6.5–8.1)
Total Bilirubin: 0.5 mg/dL (ref 0.3–1.2)

## 2014-06-22 LAB — WET PREP, GENITAL
Trich, Wet Prep: NONE SEEN
Yeast Wet Prep HPF POC: NONE SEEN

## 2014-06-22 LAB — POC URINE PREG, ED: PREG TEST UR: NEGATIVE

## 2014-06-22 MED ORDER — OXYCODONE-ACETAMINOPHEN 5-325 MG PO TABS
2.0000 | ORAL_TABLET | Freq: Once | ORAL | Status: AC
  Administered 2014-06-22: 2 via ORAL
  Filled 2014-06-22: qty 2

## 2014-06-22 NOTE — ED Notes (Signed)
Pt c/o low mid abd pain x 1 week. Denies urinary s/s, denies gyn s/s. Denies n/v/d hx of endometriosis

## 2014-06-22 NOTE — ED Provider Notes (Signed)
CSN: [id number]     Arrival date & time 06/22/14  2240 History   First MD Initiated Contact with Patient 06/22/14 2253     Chief Complaint  Patient presents with  . Abdominal Pain     (Consider location/radiation/quality/duration/timing/severity/associated sxs/prior Treatment) Patient is a 29 y.o. female presenting with abdominal pain. The history is provided by the patient and medical records. No language interpreter was used.  Abdominal Pain Associated symptoms: no chest pain, no constipation, no cough, no diarrhea, no dysuria, no fatigue, no fever, no hematuria, no nausea, no shortness of breath and no vomiting      Joyce Ward is a 29 y.o. female  with a hx of endometriosis (with hx of several laparoscopic surgeries), GERD presents to the Emergency Department complaining of gradual, persistent, progressively worsening lower abd pain onset 1 week ago.  Pt reports the pain more suprapubic than anything else.  Pt reports the pain is sharp, constant and rated at a 10/10.  Pt reports the pain radiates to her lower back and bilateral legs. Pt reports similar, but less intense in the past. She reports all previous episodes have been associated with her menses.  LMP: 05/20/14.  Pt reports she has been taking ibuprofen  at 8pm without relief.  Pt reports she is married and sexually active with 1 partner. Pt denies new vaginal discharge.     Nothing makes it better or worse.  Pt denies fever, chills, headache, neck pain, chest pain, N/V/D, weakness, dizziness, syncope, dysuria, hematuria, vaginal discharge. She denies any history of STD's or new sexual partners  [facility] - appointment on June 8th  Past Medical History  Diagnosis Date  . Endometriosis   . GERD (gastroesophageal reflux disease)    History reviewed. No pertinent past surgical history. No family history on file. History  Substance Use Topics  . Smoking status: Current Some Day Smoker    Types:  Cigarettes  . Smokeless tobacco: Not on file  . Alcohol Use: Yes     Comment: occasional   OB History    No data available     Review of Systems  Constitutional: Negative for fever, diaphoresis, appetite change, fatigue and unexpected weight change.  HENT: Negative for mouth sores and trouble swallowing.   Respiratory: Negative for cough, chest tightness, shortness of breath, wheezing and stridor.   Cardiovascular: Negative for chest pain and palpitations.  Gastrointestinal: Negative for nausea, vomiting, abdominal pain, diarrhea, constipation, blood in stool, abdominal distention and rectal pain.  Genitourinary: Positive for pelvic pain. Negative for dysuria, urgency, frequency, hematuria, flank pain and difficulty urinating.  Musculoskeletal: Negative for back pain, neck pain and neck stiffness.  Skin: Negative for rash.  Neurological: Negative for weakness.  Hematological: Negative for adenopathy.  Psychiatric/Behavioral: Negative for confusion.  All other systems reviewed and are negative.     Allergies  Codeine; Latex; and Tramadol  Home Medications   Prior to Admission medications   Medication Sig Start Date End Date Taking? Authorizing Provider  ibuprofen (ADVIL,MOTRIN) 800 MG tablet Take 1 tablet (800 mg total) by mouth every 8 (eight) hours as needed for moderate pain. 05/08/14  Yes Bethann Berkshire, MD  oxyCODONE-acetaminophen (PERCOCET/ROXICET) 5-325 MG per tablet Take 1-2 tablets by mouth every 8 (eight) hours as needed for severe pain. 06/23/14   Dania Marsan, PA-C   BP 117/70 mmHg  Pulse 96  Temp(Src) 98 F (36.7 C) (Oral)  Resp 14  SpO2 97%  LMP 05/23/2014 Physical Exam  Constitutional: She appears well-developed and well-nourished. No distress.  HENT:  Head: Normocephalic and atraumatic.  Mouth/Throat: Oropharynx is clear and moist.  Eyes: Conjunctivae are normal. No scleral icterus.  Neck: Normal range of motion.  Cardiovascular: Normal rate,  regular rhythm, normal heart sounds and intact distal pulses.   No murmur heard. Mild tachycardia  Pulmonary/Chest: Effort normal and breath sounds normal. No respiratory distress. She has no wheezes.  Abdominal: Soft. Bowel sounds are normal. She exhibits no distension and no mass. There is tenderness in the suprapubic area. There is no rebound, no guarding and no CVA tenderness. Hernia confirmed negative in the right inguinal area and confirmed negative in the left inguinal area.    Mild tenderness to palpation of the suprapubic region and equally in the right and left quadrant   Genitourinary: No labial fusion. There is no rash, tenderness or lesion on the right labia. There is no rash, tenderness or lesion on the left labia. Uterus is tender. Uterus is not deviated, not enlarged and not fixed. Cervix exhibits no motion tenderness, no discharge and no friability. Right adnexum displays tenderness. Right adnexum displays no mass and no fullness. Left adnexum displays tenderness. Left adnexum displays no mass and no fullness. No erythema, tenderness or bleeding in the vagina. No foreign body around the vagina. No signs of injury around the vagina. No vaginal discharge found.  Mild bilateral adnexal tenderness No CMT Mild TTP of the uterus without enlargement  Musculoskeletal: Normal range of motion. She exhibits no edema.  Lymphadenopathy:       Right: No inguinal adenopathy present.       Left: No inguinal adenopathy present.  Neurological: She is alert.  Skin: Skin is warm and dry. She is not diaphoretic. No erythema.  Psychiatric: She has a normal mood and affect.  Pt crying angrily  Nursing note and vitals reviewed.   ED Course  Procedures (including critical care time) Labs Review Labs Reviewed  WET PREP, GENITAL - Abnormal; Notable for the following:    Clue Cells Wet Prep HPF POC FEW (*)    WBC, Wet Prep HPF POC FEW (*)    All other components within normal limits  CBC WITH  DIFFERENTIAL/PLATELET - Abnormal; Notable for the following:    Lymphocytes Relative 48 (*)    Lymphs Abs 4.6 (*)    All other components within normal limits  COMPREHENSIVE METABOLIC PANEL - Abnormal; Notable for the following:    AST 14 (*)    All other components within normal limits  URINALYSIS, ROUTINE W REFLEX MICROSCOPIC (NOT AT [facility])  POC URINE PREG, ED  GC/CHLAMYDIA PROBE AMP ([place]) NOT AT [facility]    Imaging Review No results found.   EKG Interpretation None      MDM   Final diagnoses:  Pelvic pain in female     Joyce Ward presents with recurrent pelvic pain.  Pt has been seen for the same 4 times and the last 3 months.  On exam, she is afebrile and nontoxic in appearance. She does have tenderness of her suprapubic and pelvic regions without peritoneal signs. Wet prep without increased white blood cells, negative pregnancy test, no evidence of urinalysis, leukocytosis and CMP unremarkable. I have discussed obtaining pelvic ultrasound due to worsening pain, however patient states she declines to have this performed today. She is aware there may be abnormalities that require urgent intervention, however she still wishes to wait. Pt's pain not consistent with ovarian torsion as it is  bilateral.  She was given Percocet in the ED with significant improvement of her pain. She feels comfortable with discharge home, short supply of pain medications given. Patient was instructed to [facility] with [facility] at her appointment on June 8 for further management. Discussed plan with patient, she acknowledged understanding and agreed with plan of care. Return precautions given for new or worsening symptoms.  BP 117/70 mmHg  Pulse 96  Temp(Src) 98 F (36.7 C) (Oral)  Resp 14  SpO2 97%  LMP 05/23/2014   Dierdre ForthHannah Ricahrd Schwager, PA-C 06/23/14 [phone]  Paula LibraJohn Molpus, MD 06/23/14 [phone]

## 2014-06-23 MED ORDER — OXYCODONE-ACETAMINOPHEN 5-325 MG PO TABS: 1.0000 | ORAL_TABLET | Freq: Three times a day (TID) | ORAL | Status: DC | PRN

## 2014-06-23 NOTE — Discharge Instructions (Signed)
1. Medications: percocet, motrin, usual home medications 2. Treatment: rest, drink plenty of fluids, advance diet slowly 3. Follow Up: Please followup with your primary doctor in 2 days for discussion of your diagnoses and further evaluation after today's visit; if you do not have a primary care doctor use the resource guide provided to find one; Please return to the ER for persistent vomiting, high fevers or worsening symptoms   Pelvic Pain Female pelvic pain can be caused by many different things and start from a variety of places. Pelvic pain refers to pain that is located in the lower half of the abdomen and between your hips. The pain may occur over a short period of time (acute) or may be reoccurring (chronic). The cause of pelvic pain may be related to disorders affecting the female reproductive organs (gynecologic), but it may also be related to the bladder, kidney stones, an intestinal complication, or muscle or skeletal problems. Getting help right away for pelvic pain is important, especially if there has been severe, sharp, or a sudden onset of unusual pain. It is also important to get help right away because some types of pelvic pain can be life threatening.  CAUSES  Below are only some of the causes of pelvic pain. The causes of pelvic pain can be in one of several categories.   Gynecologic.  Pelvic inflammatory disease.  Sexually transmitted infection.  Ovarian cyst or a twisted ovarian ligament (ovarian torsion).  Uterine lining that grows outside the uterus (endometriosis).  Fibroids, cysts, or tumors.  Ovulation.  Pregnancy.  Pregnancy that occurs outside the uterus (ectopic pregnancy).  Miscarriage.  Labor.  Abruption of the placenta or ruptured uterus.  Infection.  Uterine infection (endometritis).  Bladder infection.  Diverticulitis.  Miscarriage related to a uterine infection (septic abortion).  Bladder.  Inflammation of the bladder  (cystitis).  Kidney stone(s).  Gastrointestinal.  Constipation.  Diverticulitis.  Neurologic.  Trauma.  Feeling pelvic pain because of mental or emotional causes (psychosomatic).  Cancers of the bowel or pelvis. EVALUATION  Your caregiver will want to take a careful history of your concerns. This includes recent changes in your health, a careful gynecologic history of your periods (menses), and a sexual history. Obtaining your family history and medical history is also important. Your caregiver may suggest a pelvic exam. A pelvic exam will help identify the location and severity of the pain. It also helps in the evaluation of which organ system may be involved. In order to identify the cause of the pelvic pain and be properly treated, your caregiver may order tests. These tests may include:   A pregnancy test.  Pelvic ultrasonography.  An X-ray exam of the abdomen.  A urinalysis or evaluation of vaginal discharge.  Blood tests. HOME CARE INSTRUCTIONS   Only take over-the-counter or prescription medicines for pain, discomfort, or fever as directed by your caregiver.   Rest as directed by your caregiver.   Eat a balanced diet.   Drink enough fluids to make your urine clear or pale yellow, or as directed.   Avoid sexual intercourse if it causes pain.   Apply warm or cold compresses to the lower abdomen depending on which one helps the pain.   Avoid stressful situations.   Keep a journal of your pelvic pain. Write down when it started, where the pain is located, and if there are things that seem to be associated with the pain, such as food or your menstrual cycle.  Follow up with your  caregiver as directed.  SEEK MEDICAL CARE IF:  Your medicine does not help your pain.  You have abnormal vaginal discharge. SEEK IMMEDIATE MEDICAL CARE IF:   You have heavy bleeding from the vagina.   Your pelvic pain increases.   You feel light-headed or faint.   You  have chills.   You have pain with urination or blood in your urine.   You have uncontrolled diarrhea or vomiting.   You have a fever or persistent symptoms for more than 3 days.  You have a fever and your symptoms suddenly get worse.   You are being physically or sexually abused.  MAKE SURE YOU:  Understand these instructions.  Will watch your condition.  Will get help if you are not doing well or get worse. Document Released: 12/09/2003 Document Revised: 05/28/2013 Document Reviewed: 05/03/2011 [facility] Patient Information 2015 [place], [place]. This information is not intended to replace advice given to you by your health care provider. Make sure you discuss any questions you have with your health care provider.

## 2014-06-24 ENCOUNTER — Emergency Department (HOSPITAL_COMMUNITY)
Admission: EM | Admit: 2014-06-24 | Discharge: 2014-06-24 | Disposition: A | Discharge status: Home / Self Care | Payer: Medicaid Other | Attending: Emergency Medicine | Admitting: Emergency Medicine

## 2014-06-24 ENCOUNTER — Encounter (HOSPITAL_COMMUNITY): Payer: Self-pay | Admitting: Emergency Medicine

## 2014-06-24 DIAGNOSIS — R102 Pelvic and perineal pain: Secondary | ICD-10-CM | POA: Insufficient documentation

## 2014-06-24 DIAGNOSIS — R000 Tachycardia, unspecified: Secondary | ICD-10-CM | POA: Insufficient documentation

## 2014-06-24 DIAGNOSIS — Z3202 Encounter for pregnancy test, result negative: Secondary | ICD-10-CM | POA: Insufficient documentation

## 2014-06-24 DIAGNOSIS — R1032 Left lower quadrant pain: Secondary | ICD-10-CM | POA: Diagnosis not present

## 2014-06-24 DIAGNOSIS — R1031 Right lower quadrant pain: Secondary | ICD-10-CM | POA: Insufficient documentation

## 2014-06-24 DIAGNOSIS — Z91040 Latex allergy status: Secondary | ICD-10-CM | POA: Diagnosis not present

## 2014-06-24 DIAGNOSIS — Z720 Tobacco use: Secondary | ICD-10-CM | POA: Insufficient documentation

## 2014-06-24 DIAGNOSIS — Z8719 Personal history of other diseases of the digestive system: Secondary | ICD-10-CM | POA: Diagnosis not present

## 2014-06-24 DIAGNOSIS — Z8742 Personal history of other diseases of the female genital tract: Secondary | ICD-10-CM | POA: Insufficient documentation

## 2014-06-24 LAB — URINALYSIS, ROUTINE W REFLEX MICROSCOPIC
Bilirubin Urine: NEGATIVE
Glucose, UA: NEGATIVE mg/dL
Hgb urine dipstick: NEGATIVE
Ketones, ur: NEGATIVE mg/dL
Leukocytes, UA: NEGATIVE
Nitrite: NEGATIVE
PROTEIN: NEGATIVE mg/dL
Specific Gravity, Urine: 1.004 — ABNORMAL LOW (ref 1.005–1.030)
Urobilinogen, UA: 0.2 mg/dL (ref 0.0–1.0)
pH: 5.5 (ref 5.0–8.0)

## 2014-06-24 LAB — POC URINE PREG, ED: Preg Test, Ur: NEGATIVE

## 2014-06-24 MED ORDER — KETOROLAC TROMETHAMINE 60 MG/2ML IM SOLN
30.0000 mg | Freq: Once | INTRAMUSCULAR | Status: DC
  Filled 2014-06-24: qty 2

## 2014-06-24 MED ORDER — NAPROXEN 500 MG PO TABS
500.0000 mg | ORAL_TABLET | Freq: Two times a day (BID) | ORAL | Status: DC
Start: 2014-06-24 — End: 2014-07-02

## 2014-06-24 MED ORDER — OXYCODONE-ACETAMINOPHEN 5-325 MG PO TABS
2.0000 | ORAL_TABLET | Freq: Once | ORAL | Status: AC
  Administered 2014-06-24: 2 via ORAL
  Filled 2014-06-24: qty 2

## 2014-06-24 NOTE — Progress Notes (Signed)
[facility] is [facility] [address] [place], [place] [id number] [phone]

## 2014-06-24 NOTE — ED Notes (Signed)
Pt denies discharge vitals

## 2014-06-24 NOTE — Discharge Instructions (Signed)
Abdominal Pain, Women °Abdominal (stomach, pelvic, or belly) pain can be caused by many things. It is important to tell your doctor: °· The location of the pain. °· Does it come and go or is it present all the time? °· Are there things that start the pain (eating certain foods, exercise)? °· Are there other symptoms associated with the pain (fever, nausea, vomiting, diarrhea)? °All of this is helpful to know when trying to find the cause of the pain. °CAUSES  °· Stomach: virus or bacteria infection, or ulcer. °· Intestine: appendicitis (inflamed appendix), regional ileitis (Crohn's disease), ulcerative colitis (inflamed colon), irritable bowel syndrome, diverticulitis (inflamed diverticulum of the colon), or cancer of the stomach or intestine. °· Gallbladder disease or stones in the gallbladder. °· Kidney disease, kidney stones, or infection. °· Pancreas infection or cancer. °· Fibromyalgia (pain disorder). °· Diseases of the female organs: °· Uterus: fibroid (non-cancerous) tumors or infection. °· Fallopian tubes: infection or tubal pregnancy. °· Ovary: cysts or tumors. °· Pelvic adhesions (scar tissue). °· Endometriosis (uterus lining tissue growing in the pelvis and on the pelvic organs). °· Pelvic congestion syndrome (female organs filling up with blood just before the menstrual period). °· Pain with the menstrual period. °· Pain with ovulation (producing an egg). °· Pain with an IUD (intrauterine device, birth control) in the uterus. °· Cancer of the female organs. °· Functional pain (pain not caused by a disease, may improve without treatment). °· Psychological pain. °· Depression. °DIAGNOSIS  °Your doctor will decide the seriousness of your pain by doing an examination. °· Blood tests. °· X-rays. °· Ultrasound. °· CT scan (computed tomography, special type of X-ray). °· MRI (magnetic resonance imaging). °· Cultures, for infection. °· Barium enema (dye inserted in the large intestine, to better view it with  X-rays). °· Colonoscopy (looking in intestine with a lighted tube). °· Laparoscopy (minor surgery, looking in abdomen with a lighted tube). °· Major abdominal exploratory surgery (looking in abdomen with a large incision). °TREATMENT  °The treatment will depend on the cause of the pain.  °· Many cases can be observed and treated at home. °· Over-the-counter medicines recommended by your caregiver. °· Prescription medicine. °· Antibiotics, for infection. °· Birth control pills, for painful periods or for ovulation pain. °· Hormone treatment, for endometriosis. °· Nerve blocking injections. °· Physical therapy. °· Antidepressants. °· Counseling with a psychologist or psychiatrist. °· Minor or major surgery. °HOME CARE INSTRUCTIONS  °· Do not take laxatives, unless directed by your caregiver. °· Take over-the-counter pain medicine only if ordered by your caregiver. Do not take aspirin because it can cause an upset stomach or bleeding. °· Try a clear liquid diet (broth or water) as ordered by your caregiver. Slowly move to a bland diet, as tolerated, if the pain is related to the stomach or intestine. °· Have a thermometer and take your temperature several times a day, and record it. °· Bed rest and sleep, if it helps the pain. °· Avoid sexual intercourse, if it causes pain. °· Avoid stressful situations. °· Keep your follow-up appointments and tests, as your caregiver orders. °· If the pain does not go away with medicine or surgery, you may try: °· Acupuncture. °· Relaxation exercises (yoga, meditation). °· Group therapy. °· Counseling. °SEEK MEDICAL CARE IF:  °· You notice certain foods cause stomach pain. °· Your home care treatment is not helping your pain. °· You need stronger pain medicine. °· You want your IUD removed. °· You feel faint or   lightheaded.  You develop nausea and vomiting.  You develop a rash.  You are having side effects or an allergy to your medicine. SEEK IMMEDIATE MEDICAL CARE IF:   Your  pain does not go away or gets worse.  You have a fever.  Your pain is felt only in portions of the abdomen. The right side could possibly be appendicitis. The left lower portion of the abdomen could be colitis or diverticulitis.  You are passing blood in your stools (bright red or black tarry stools, with or without vomiting).  You have blood in your urine.  You develop chills, with or without a fever.  You pass out. MAKE SURE YOU:   Understand these instructions.  Will watch your condition.  Will get help right away if you are not doing well or get worse. Document Released: 11/08/2006 Document Revised: 05/28/2013 Document Reviewed: 11/28/2008 [facility] Patient Information 2015 [place], [place]. This information is not intended to replace advice given to you by your health care provider. Make sure you discuss any questions you have with your health care provider. Pelvic Pain Female pelvic pain can be caused by many different things and start from a variety of places. Pelvic pain refers to pain that is located in the lower half of the abdomen and between your hips. The pain may occur over a short period of time (acute) or may be reoccurring (chronic). The cause of pelvic pain may be related to disorders affecting the female reproductive organs (gynecologic), but it may also be related to the bladder, kidney stones, an intestinal complication, or muscle or skeletal problems. Getting help right away for pelvic pain is important, especially if there has been severe, sharp, or a sudden onset of unusual pain. It is also important to get help right away because some types of pelvic pain can be life threatening.  CAUSES  Below are only some of the causes of pelvic pain. The causes of pelvic pain can be in one of several categories.   Gynecologic.  Pelvic inflammatory disease.  Sexually transmitted infection.  Ovarian cyst or a twisted ovarian ligament (ovarian torsion).  Uterine lining that  grows outside the uterus (endometriosis).  Fibroids, cysts, or tumors.  Ovulation.  Pregnancy.  Pregnancy that occurs outside the uterus (ectopic pregnancy).  Miscarriage.  Labor.  Abruption of the placenta or ruptured uterus.  Infection.  Uterine infection (endometritis).  Bladder infection.  Diverticulitis.  Miscarriage related to a uterine infection (septic abortion).  Bladder.  Inflammation of the bladder (cystitis).  Kidney stone(s).  Gastrointestinal.  Constipation.  Diverticulitis.  Neurologic.  Trauma.  Feeling pelvic pain because of mental or emotional causes (psychosomatic).  Cancers of the bowel or pelvis. EVALUATION  Your caregiver will want to take a careful history of your concerns. This includes recent changes in your health, a careful gynecologic history of your periods (menses), and a sexual history. Obtaining your family history and medical history is also important. Your caregiver may suggest a pelvic exam. A pelvic exam will help identify the location and severity of the pain. It also helps in the evaluation of which organ system may be involved. In order to identify the cause of the pelvic pain and be properly treated, your caregiver may order tests. These tests may include:   A pregnancy test.  Pelvic ultrasonography.  An X-ray exam of the abdomen.  A urinalysis or evaluation of vaginal discharge.  Blood tests. HOME CARE INSTRUCTIONS   Only take over-the-counter or prescription medicines for pain,  discomfort, or fever as directed by your caregiver.   Rest as directed by your caregiver.   Eat a balanced diet.   Drink enough fluids to make your urine clear or pale yellow, or as directed.   Avoid sexual intercourse if it causes pain.   Apply warm or cold compresses to the lower abdomen depending on which one helps the pain.   Avoid stressful situations.   Keep a journal of your pelvic pain. Write down when it started,  where the pain is located, and if there are things that seem to be associated with the pain, such as food or your menstrual cycle.  Follow up with your caregiver as directed.  SEEK MEDICAL CARE IF:  Your medicine does not help your pain.  You have abnormal vaginal discharge. SEEK IMMEDIATE MEDICAL CARE IF:   You have heavy bleeding from the vagina.   Your pelvic pain increases.   You feel light-headed or faint.   You have chills.   You have pain with urination or blood in your urine.   You have uncontrolled diarrhea or vomiting.   You have a fever or persistent symptoms for more than 3 days.  You have a fever and your symptoms suddenly get worse.   You are being physically or sexually abused.  MAKE SURE YOU:  Understand these instructions.  Will watch your condition.  Will get help if you are not doing well or get worse. Document Released: 12/09/2003 Document Revised: 05/28/2013 Document Reviewed: 05/03/2011 [facility] Patient Information 2015 [place], [place]. This information is not intended to replace advice given to you by your health care provider. Make sure you discuss any questions you have with your health care provider. Endometriosis Endometriosis is a condition in which the tissue that lines the uterus (endometrium) grows outside of its normal location. The tissue may grow in many locations close to the uterus, but it commonly grows on the ovaries, fallopian tubes, vagina, or bowel. Because the uterus expels, or sheds, its lining every menstrual cycle, there is bleeding wherever the endometrial tissue is located. This can cause pain because blood is irritating to tissues not normally exposed to it.  CAUSES  The cause of endometriosis is not known.  SIGNS AND SYMPTOMS  Often, there are no symptoms. When symptoms are present, they can vary with the location of the displaced tissue. Various symptoms can occur at different times. Although symptoms occur mainly  during a woman's menstrual period, they can also occur midcycle and usually stop with menopause. Some people may go months with no symptoms at all. Symptoms may include:   Back or abdominal pain.   Heavier bleeding during periods.   Pain during intercourse.   Painful bowel movements.   Infertility. DIAGNOSIS  Your health care provider will do a physical exam and ask about your symptoms. Various tests may be done, such as:   Blood tests and urine tests. These are done to help rule out other problems.   Ultrasound. This test is done to look for abnormal tissue.   An X-ray of the lower bowel (barium enema).  Laparoscopy. In this procedure, a thin, lighted tube with a tiny camera on the end (laparoscope) is inserted into your abdomen. This helps your health care provider look for abnormal tissue to confirm the diagnosis. The health care provider may also remove a small piece of tissue (biopsy) from any abnormal tissue found. This tissue sample can then be sent to a lab so it can be looked  at under a microscope. TREATMENT  Treatment will vary and may include:   Medicines to relieve pain. Nonsteroidal anti-inflammatory drugs (NSAIDs) are a type of pain medicine that can help to relieve the pain caused by endometriosis.  Hormonal therapy. When using hormonal therapy, periods are eliminated. This eliminates the monthly exposure to blood by the displaced endometrial tissue.   Surgery. Surgery may sometimes be done to remove the abnormal endometrial tissue. In severe cases, surgery may be done to remove the fallopian tubes, uterus, and ovaries (hysterectomy). HOME CARE INSTRUCTIONS   Take all medicines as directed by your health care provider. Do not take aspirin because it may increase bleeding when you are not on hormonal therapy.   Avoid activities that produce pain, including sexual activity. SEEK MEDICAL CARE IF:  You have pelvic pain before, after, or during your  periods.  You have pelvic pain between periods that gets worse during your period.  You have pelvic pain during or after sex.  You have pelvic pain with bowel movements or urination, especially during your period.  You have problems getting pregnant.  You have a fever. SEEK IMMEDIATE MEDICAL CARE IF:   Your pain is severe and is not responding to pain medicine.   You have severe nausea and vomiting, or you cannot keep foods down.   You have pain that is limited to the right lower part of your abdomen.   You have swelling or increasing pain in your abdomen.   You see blood in your stool.  MAKE SURE YOU:   Understand these instructions.  Will watch your condition.  Will get help right away if you are not doing well or get worse. Document Released: 01/09/2000 Document Revised: 05/28/2013 Document Reviewed: 09/08/2012 [facility] Patient Information 2015 [place], [place]. This information is not intended to replace advice given to you by your health care provider. Make sure you discuss any questions you have with your health care provider.

## 2014-06-24 NOTE — ED Provider Notes (Signed)
CSN: [id number]     Arrival date & time 06/24/14  1234 History   First MD Initiated Contact with Patient 06/24/14 1459     Chief Complaint  Patient presents with  . Pelvic Pain   Joyce Ward is a 29 y.o. female with a history of endometriosis who presents to the ED complaining of gradual onset and constant bilateral suprapubic abdominal pain ongoing for the past 2 weeks. Patient complains of a sharp 10 out of 10 low abdominal pain which is constant and worse with certain movement. The patient reports having previous multiple laparoscopic surgeries for endometriosis. She reports taking 800 mg of ibuprofen around 10:30 this morning with minimal relief. This is the patient's fifth emergency department visits for the same pain in 6 weeks. She is requesting a refill of percocet. The patient denies fevers, chills, urinary symptoms, hematuria, nausea, vomiting, vaginal bleeding, vaginal discharge, constipation, diarrhea, or rashes.   (Consider location/radiation/quality/duration/timing/severity/associated sxs/prior Treatment) HPI  Past Medical History  Diagnosis Date  . Endometriosis   . GERD (gastroesophageal reflux disease)    No past surgical history on file. No family history on file. History  Substance Use Topics  . Smoking status: Current Some Day Smoker    Types: Cigarettes  . Smokeless tobacco: Not on file  . Alcohol Use: Yes     Comment: occasional   OB History    No data available     Review of Systems  Constitutional: Negative for fever and chills.  HENT: Negative for congestion and sore throat.   Eyes: Negative for visual disturbance.  Respiratory: Negative for cough and shortness of breath.   Cardiovascular: Negative for chest pain and palpitations.  Gastrointestinal: Positive for abdominal pain. Negative for nausea, vomiting and diarrhea.  Genitourinary: Positive for pelvic pain. Negative for dysuria, urgency, frequency, hematuria, flank pain, decreased urine volume,  vaginal bleeding, vaginal discharge, difficulty urinating and menstrual problem.  Musculoskeletal: Negative for back pain and neck pain.  Skin: Negative for rash.  Neurological: Negative for light-headedness and headaches.      Allergies  Codeine; Latex; Toradol; and Tramadol  Home Medications   Prior to Admission medications   Medication Sig Start Date End Date Taking? Authorizing Provider  oxyCODONE-acetaminophen (PERCOCET/ROXICET) 5-325 MG per tablet Take 1-2 tablets by mouth every 8 (eight) hours as needed for severe pain. 06/23/14  Yes Hannah Muthersbaugh, PA-C  naproxen (NAPROSYN) 500 MG tablet Take 1 tablet (500 mg total) by mouth 2 (two) times daily with a meal. 06/24/14   Everlene Farrier, PA-C   BP 113/69 mmHg  Pulse 107  Temp(Src) 98.6 F (37 C) (Oral)  Resp 16  SpO2 100%  LMP 05/23/2014 Physical Exam  Constitutional: She is oriented to person, place, and time. She appears well-developed and well-nourished. No distress.  Nontoxic appearing.  HENT:  Head: Normocephalic and atraumatic.  Mouth/Throat: Oropharynx is clear and moist. No oropharyngeal exudate.  Eyes: Conjunctivae are normal. Pupils are equal, round, and reactive to light. Right eye exhibits no discharge. Left eye exhibits no discharge.  Neck: Normal range of motion. Neck supple. No JVD present.  Cardiovascular: Regular rhythm, normal heart sounds and intact distal pulses.  Exam reveals no gallop and no friction rub.   No murmur heard. Mildly tachycardic with a heart rate of 102.  Pulmonary/Chest: Effort normal and breath sounds normal. No respiratory distress. She has no wheezes. She has no rales.  Abdominal: Soft. Bowel sounds are normal. She exhibits no distension and no mass. There is tenderness.  There is no rebound and no guarding.  Abdomen is soft. Bowel sounds are present. Patient has moderate suprapubic and bilateral lower abdominal tenderness to palpation. No rebound tenderness. Negative psoas and  obturator sign. Negative Rovsing sign. No CVA tenderness.  Musculoskeletal: She exhibits no edema.  Lymphadenopathy:    She has no cervical adenopathy.  Neurological: She is alert and oriented to person, place, and time. Coordination normal.  Skin: Skin is warm and dry. No rash noted. She is not diaphoretic. No erythema. No pallor.  Psychiatric: She has a normal mood and affect. Her behavior is normal.  Nursing note and vitals reviewed.   ED Course  Procedures (including critical care time) Labs Review Labs Reviewed  URINALYSIS, ROUTINE W REFLEX MICROSCOPIC (NOT AT [facility]) - Abnormal; Notable for the following:    Specific Gravity, Urine 1.004 (*)    All other components within normal limits  POC URINE PREG, ED    Imaging Review No results found.   EKG Interpretation None      Filed Vitals:   06/24/14 1238  BP: 113/69  Pulse: 107  Temp: 98.6 F (37 C)  TempSrc: Oral  Resp: 16  SpO2: 100%     MDM   Meds given in ED:  Medications  oxyCODONE-acetaminophen (PERCOCET/ROXICET) 5-325 MG per tablet 2 tablet (2 tablets Oral Given 06/24/14 1550)    New Prescriptions   NAPROXEN (NAPROSYN) 500 MG TABLET    Take 1 tablet (500 mg total) by mouth 2 (two) times daily with a meal.    Final diagnoses:  Pelvic pain in female   This is a 29 y.o. female with a history of endometriosis who presents to the ED complaining of gradual onset and constant bilateral suprapubic abdominal pain ongoing for the past 2 weeks. Patient complains of a sharp 10 out of 10 low abdominal pain which is constant and worse with certain movement. The patient reports having previous multiple laparoscopic surgeries for endometriosis.  This is the patient's fifth emergency department visits for the same pain in 6 weeks. She was last seen 2 days ago in the ED where she had a pelvic exam but refused a pelvic ultrasound. She reports she is out of her percocet and she is requesting a refill. She reports having a follow  up appointment with her OB/GYN in June. The patient denies changes to her pain. She denies fevers, urinary symptoms, vaginal bleeding or vaginal discharge. The patient has mild bilateral suprapubic tenderness to palpation. No peritoneal signs. She is a negative urine pregnancy test. Her urinalysis is negative for infection.As this is the same pain that has been ongoing I see no need for further work up at this time.  The patient Toradol which she received her last visit but she reports she is allergic to this. She reports having a reaction at the site where she had injection. We'll provide the patient with 2 Percocet but I advised her I would not be able to give her a prescription for narcotic pain medicine. Will change to Naproxen and have her stop taking her ibuprofen. She is in agreement with this. I advised the patient to follow-up with her OB/GYN as soon as possible. Strict return precautions provided. I advised the patient to follow-up with their primary care provider this week. I advised the patient to return to the emergency department with new or worsening symptoms or new concerns. The patient verbalized understanding and agreement with plan.    This patient was discussed with Dr. Rubin PayorPickering  who agrees with assessment and plan.    Everlene Farrier, PA-C 06/24/14 1553  Benjiman Core, MD 06/25/14 [phone]

## 2014-06-24 NOTE — ED Notes (Signed)
Pt states that she has been having pelvic pain x 2 wks. States that she was seen here on 5/28 but states that the doctor did not give her enough pain medication to make it to her gyn appt on June 15.  States she needs more oxycodone for endometriosis.

## 2014-06-25 LAB — GC/CHLAMYDIA PROBE AMP (~~LOC~~) NOT AT ARMC
Chlamydia: NEGATIVE
Neisseria Gonorrhea: NEGATIVE

## 2014-07-01 ENCOUNTER — Emergency Department (HOSPITAL_COMMUNITY)
Admission: EM | Admit: 2014-07-01 | Discharge: 2014-07-02 | Discharge status: Against Medical Advice | Payer: Medicaid Other | Attending: Emergency Medicine | Admitting: Emergency Medicine

## 2014-07-01 DIAGNOSIS — S7002XA Contusion of left hip, initial encounter: Secondary | ICD-10-CM | POA: Diagnosis not present

## 2014-07-01 DIAGNOSIS — Z720 Tobacco use: Secondary | ICD-10-CM | POA: Insufficient documentation

## 2014-07-01 DIAGNOSIS — R102 Pelvic and perineal pain: Secondary | ICD-10-CM | POA: Diagnosis present

## 2014-07-01 NOTE — ED Notes (Signed)
Went in to triage pt  Pt not in room

## 2014-07-02 ENCOUNTER — Emergency Department (HOSPITAL_COMMUNITY): Payer: Medicaid Other

## 2014-07-02 ENCOUNTER — Encounter (HOSPITAL_COMMUNITY): Payer: Self-pay | Admitting: Emergency Medicine

## 2014-07-02 ENCOUNTER — Emergency Department (HOSPITAL_COMMUNITY)
Admission: EM | Admit: 2014-07-02 | Discharge: 2014-07-02 | Disposition: A | Discharge status: Home / Self Care | Payer: Medicaid Other | Attending: Emergency Medicine | Admitting: Emergency Medicine

## 2014-07-02 DIAGNOSIS — Y998 Other external cause status: Secondary | ICD-10-CM | POA: Insufficient documentation

## 2014-07-02 DIAGNOSIS — Z8742 Personal history of other diseases of the female genital tract: Secondary | ICD-10-CM | POA: Insufficient documentation

## 2014-07-02 DIAGNOSIS — Y92410 Unspecified street and highway as the place of occurrence of the external cause: Secondary | ICD-10-CM | POA: Insufficient documentation

## 2014-07-02 DIAGNOSIS — Z8719 Personal history of other diseases of the digestive system: Secondary | ICD-10-CM | POA: Insufficient documentation

## 2014-07-02 DIAGNOSIS — T1490XA Injury, unspecified, initial encounter: Secondary | ICD-10-CM

## 2014-07-02 DIAGNOSIS — Z91040 Latex allergy status: Secondary | ICD-10-CM | POA: Insufficient documentation

## 2014-07-02 DIAGNOSIS — S7002XA Contusion of left hip, initial encounter: Secondary | ICD-10-CM | POA: Diagnosis not present

## 2014-07-02 DIAGNOSIS — Y9301 Activity, walking, marching and hiking: Secondary | ICD-10-CM | POA: Insufficient documentation

## 2014-07-02 DIAGNOSIS — R000 Tachycardia, unspecified: Secondary | ICD-10-CM | POA: Insufficient documentation

## 2014-07-02 DIAGNOSIS — Z720 Tobacco use: Secondary | ICD-10-CM | POA: Diagnosis not present

## 2014-07-02 DIAGNOSIS — S79911A Unspecified injury of right hip, initial encounter: Secondary | ICD-10-CM | POA: Diagnosis present

## 2014-07-02 MED ORDER — HYDROCODONE-ACETAMINOPHEN 5-325 MG PO TABS: 2.0000 | ORAL_TABLET | ORAL | Status: DC | PRN

## 2014-07-02 MED ORDER — HYDROCODONE-ACETAMINOPHEN 5-325 MG PO TABS
2.0000 | ORAL_TABLET | Freq: Once | ORAL | Status: AC
  Administered 2014-07-02: 2 via ORAL
  Filled 2014-07-02: qty 2
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
## 2014-07-02 NOTE — Discharge Instructions (Signed)
Please call your doctor for a followup appointment within 24-48 hours. When you talk to your doctor please let them know that you were seen in the emergency department and have them acquire all of your records so that they can discuss the findings with you and formulate a treatment plan to fully care for your new and ongoing problems. ° °[facility] Doctor List ° ° ° °Edward Hawkins MD. Specialty: Pulmonary Disease Contact information: [address]  °[address]  °[place] [address]  °[phone]  ° °Margaret Simpson, MD. Specialty: Family Medicine Contact information: [address]  °[place] [address]  °[phone]  ° °Scott Luking, MD. Specialty: Family Medicine Contact information: [address]  °[address]  °[place] [address]  °[phone]  ° °Tesfaye Fanta, MD Specialty: Internal Medicine Contact information: [address]  °[place] [address]  °[phone]  ° °Zach Hall, MD. Specialty: Internal Medicine Contact information: [address]  °[place] [address]  °[phone]  ° °Angus Mcinnis, MD. Specialty: Family Medicine Contact information: [address]  °[place] [address]  °[phone]  ° °Stephen Knowlton, MD. Specialty: Family Medicine Contact information: [address]  °[address]  °[place] [address]  °[phone]  ° °Roy Fagan, MD. Specialty: Internal Medicine Contact information: [address]  °[address]  °[place] [address]  °[phone]  ° ° °
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
## 2014-07-02 NOTE — ED Notes (Signed)
MD at bedside. 

## 2014-07-09 ENCOUNTER — Emergency Department (HOSPITAL_COMMUNITY)
Admission: EM | Admit: 2014-07-09 | Discharge: 2014-07-09 | Discharge status: Against Medical Advice | Payer: Medicaid Other | Attending: Emergency Medicine | Admitting: Emergency Medicine

## 2014-07-09 ENCOUNTER — Encounter (HOSPITAL_COMMUNITY): Payer: Self-pay | Admitting: Emergency Medicine

## 2014-07-09 DIAGNOSIS — G8929 Other chronic pain: Secondary | ICD-10-CM | POA: Diagnosis not present

## 2014-07-09 DIAGNOSIS — Z8742 Personal history of other diseases of the female genital tract: Secondary | ICD-10-CM | POA: Insufficient documentation

## 2014-07-09 DIAGNOSIS — Z3202 Encounter for pregnancy test, result negative: Secondary | ICD-10-CM | POA: Diagnosis not present

## 2014-07-09 DIAGNOSIS — Z91040 Latex allergy status: Secondary | ICD-10-CM | POA: Diagnosis not present

## 2014-07-09 DIAGNOSIS — Z720 Tobacco use: Secondary | ICD-10-CM | POA: Insufficient documentation

## 2014-07-09 DIAGNOSIS — R102 Pelvic and perineal pain: Secondary | ICD-10-CM | POA: Insufficient documentation

## 2014-07-09 DIAGNOSIS — Z8719 Personal history of other diseases of the digestive system: Secondary | ICD-10-CM | POA: Diagnosis not present

## 2014-07-09 DIAGNOSIS — Z791 Long term (current) use of non-steroidal anti-inflammatories (NSAID): Secondary | ICD-10-CM | POA: Diagnosis not present

## 2014-07-09 LAB — URINALYSIS, ROUTINE W REFLEX MICROSCOPIC
BILIRUBIN URINE: NEGATIVE
Glucose, UA: NEGATIVE mg/dL
Hgb urine dipstick: NEGATIVE
Ketones, ur: NEGATIVE mg/dL
Leukocytes, UA: NEGATIVE
NITRITE: NEGATIVE
PROTEIN: NEGATIVE mg/dL
Specific Gravity, Urine: 1.017 (ref 1.005–1.030)
UROBILINOGEN UA: 1 mg/dL (ref 0.0–1.0)
pH: 7 (ref 5.0–8.0)

## 2014-07-09 LAB — PREGNANCY, URINE: Preg Test, Ur: NEGATIVE

## 2014-07-09 MED ORDER — OXYCODONE-ACETAMINOPHEN 5-325 MG PO TABS: 1.0000 | ORAL_TABLET | Freq: Once | ORAL | Status: DC

## 2014-07-09 MED ORDER — IBUPROFEN 800 MG PO TABS
800.0000 mg | ORAL_TABLET | Freq: Once | ORAL | Status: AC
  Administered 2014-07-09: 800 mg via ORAL
  Filled 2014-07-09: qty 1

## 2014-07-09 MED ORDER — HYDROCODONE-ACETAMINOPHEN 5-325 MG PO TABS
1.0000 | ORAL_TABLET | Freq: Once | ORAL | Status: AC
  Administered 2014-07-09: 1 via ORAL
  Filled 2014-07-09: qty 1

## 2014-07-09 NOTE — ED Provider Notes (Addendum)
CSN: [id number]     Arrival date & time 07/09/14  4098 History   First MD Initiated Contact with Patient 07/09/14 [phone]     Chief Complaint  Patient presents with  . Pelvic Pain     ELOPED FROM ER ON 07/10/14   HPI Patient reports intermittent lower pelvic pain over the past 4-5 months.  She's been seen intermittently in the emergency department for similar symptoms.  No pathology is been found thus far.  She reports that she is having worsening pain over the past 5 days now.  She denies nausea and vomiting.  She states she's tried anti-inflammatories without improvement in her symptoms.  She denies dysuria or urinary frequency.  No vaginal bleeding or vaginal discharge.  No pain with intercourse.  Denies diarrhea.  No rectal pain.  No fevers or chills.  Her pain is moderate in severity.   Past Medical History  Diagnosis Date  . Endometriosis   . GERD (gastroesophageal reflux disease)    History reviewed. No pertinent past surgical history. History reviewed. No pertinent family history. History  Substance Use Topics  . Smoking status: Current Some Day Smoker -- 1.00 packs/day    Types: Cigarettes  . Smokeless tobacco: Not on file  . Alcohol Use: Yes     Comment: occasional (yesterday)   OB History    Gravida Para Term Preterm AB TAB SAB Ectopic Multiple Living            2     Review of Systems  All other systems reviewed and are negative.     Allergies  Codeine; Latex; Toradol; and Tramadol  Home Medications   Prior to Admission medications   Medication Sig Start Date End Date Taking? Authorizing Provider  acetaminophen (TYLENOL) 500 MG tablet Take 1,000 mg by mouth every 6 (six) hours as needed for mild pain, moderate pain or headache.   Yes Historical Provider, MD  ibuprofen (ADVIL,MOTRIN) 800 MG tablet Take 800 mg by mouth every 8 (eight) hours as needed for headache, mild pain or moderate pain. For moderate pain 05/08/14  Yes Historical Provider, MD  naproxen  (NAPROSYN) 500 MG tablet Take 500 mg by mouth 2 (two) times daily with a meal. 06/24/14  Yes Historical Provider, MD  HYDROcodone-acetaminophen (NORCO/VICODIN) 5-325 MG per tablet Take 2 tablets by mouth every 4 (four) hours as needed. Patient not taking: Reported on 07/09/2014 07/02/14   Eber Hong, MD   BP 133/82 mmHg  Pulse 96  Temp(Src) 98.2 F (36.8 C) (Oral)  Resp 17  Ht  (1.6 m)  Wt 120 lb (54.432 kg)  BMI 21.26 kg/m2  SpO2 99%  LMP 06/11/2014 Physical Exam  Constitutional: She is oriented to person, place, and time. She appears well-developed and well-nourished. No distress.  HENT:  Head: Normocephalic and atraumatic.  Eyes: EOM are normal.  Neck: Normal range of motion.  Cardiovascular: Normal rate, regular rhythm and normal heart sounds.   Pulmonary/Chest: Effort normal and breath sounds normal.  Abdominal: Soft. She exhibits no distension. There is no tenderness. There is no rebound and no guarding.  Musculoskeletal: Normal range of motion.  Neurological: She is alert and oriented to person, place, and time.  Skin: Skin is warm and dry.  Psychiatric: She has a normal mood and affect. Judgment normal.  Nursing note and vitals reviewed.   ED Course  Procedures (including critical care time) Labs Review Labs Reviewed  URINALYSIS, ROUTINE W REFLEX MICROSCOPIC (NOT AT [facility])  PREGNANCY, URINE  Imaging Review No results found.   EKG Interpretation None      MDM   Final diagnoses:  Chronic pelvic pain in female    Patient with recurrent visits to emergency department for similar symptoms.  This is likely chronic pelvic pain.  She will need follow-up with GYN.  When I told her that I would not provide narcotic medication at time of discharge she took the hydrocodone that was offered here in the emergency department and left the emergency department.  I'm highly concerned about the possibility of other substance abuse or diverting behavior.    Azalia Bilis, MD 07/09/14 0511  Azalia Bilis, MD 07/09/14 [phone]

## 2014-07-09 NOTE — ED Notes (Signed)
Pt c/o pelvic pain that is worse and goes into back x 5 days. Has been seen multiple times for it, states meds have not been working. Pt cancel OBGYN appt for tomorrow due to insurance reasons. Denies vaginal d/c or bleeding.

## 2014-07-22 ENCOUNTER — Emergency Department (HOSPITAL_COMMUNITY): Payer: Medicaid Other

## 2014-07-22 ENCOUNTER — Encounter (HOSPITAL_COMMUNITY): Payer: Self-pay | Admitting: *Deleted

## 2014-07-22 ENCOUNTER — Emergency Department (HOSPITAL_COMMUNITY)
Admission: EM | Admit: 2014-07-22 | Discharge: 2014-07-22 | Disposition: A | Discharge status: Home / Self Care | Payer: Medicaid Other | Attending: Emergency Medicine | Admitting: Emergency Medicine

## 2014-07-22 DIAGNOSIS — Z8742 Personal history of other diseases of the female genital tract: Secondary | ICD-10-CM | POA: Insufficient documentation

## 2014-07-22 DIAGNOSIS — Z720 Tobacco use: Secondary | ICD-10-CM | POA: Insufficient documentation

## 2014-07-22 DIAGNOSIS — Z8719 Personal history of other diseases of the digestive system: Secondary | ICD-10-CM | POA: Diagnosis not present

## 2014-07-22 DIAGNOSIS — Z91040 Latex allergy status: Secondary | ICD-10-CM | POA: Insufficient documentation

## 2014-07-22 DIAGNOSIS — J209 Acute bronchitis, unspecified: Secondary | ICD-10-CM | POA: Insufficient documentation

## 2014-07-22 DIAGNOSIS — F17200 Nicotine dependence, unspecified, uncomplicated: Secondary | ICD-10-CM

## 2014-07-22 DIAGNOSIS — R05 Cough: Secondary | ICD-10-CM | POA: Diagnosis present

## 2014-07-22 MED ORDER — AZITHROMYCIN 250 MG PO TABS: ORAL_TABLET | ORAL | Status: DC

## 2014-07-22 MED ORDER — HYDROCODONE-ACETAMINOPHEN 5-325 MG PO TABS: 1.0000 | ORAL_TABLET | Freq: Once | ORAL | Status: DC

## 2014-07-22 MED ORDER — AZITHROMYCIN 250 MG PO TABS
500.0000 mg | ORAL_TABLET | Freq: Once | ORAL | Status: AC
  Administered 2014-07-22: 500 mg via ORAL
  Filled 2014-07-22: qty 2

## 2014-07-22 MED ORDER — HYDROCOD POLST-CPM POLST ER 10-8 MG/5ML PO SUER: 5.0000 mL | Freq: Two times a day (BID) | ORAL | Status: DC | PRN

## 2014-07-22 MED ORDER — HYDROCOD POLST-CPM POLST ER 10-8 MG/5ML PO SUER
5.0000 mL | Freq: Once | ORAL | Status: AC
  Administered 2014-07-22: 5 mL via ORAL
  Filled 2014-07-22: qty 5

## 2014-07-22 NOTE — ED Notes (Signed)
4-5 days of chest congestion and cough.  States it burns in chest.  Tmax at home of 102.3.  Took one dose of Ibuprofen .

## 2014-07-22 NOTE — Discharge Instructions (Signed)
Acute Bronchitis Bronchitis is inflammation of the airways that extend from the windpipe into the lungs (bronchi). The inflammation often causes mucus to develop. This leads to a cough, which is the most common symptom of bronchitis.  In acute bronchitis, the condition usually develops suddenly and goes away over time, usually in a couple weeks. Smoking, allergies, and asthma can make bronchitis worse. Repeated episodes of bronchitis may cause further lung problems.  CAUSES Acute bronchitis is most often caused by the same virus that causes a cold. The virus can spread from person to person (contagious) through coughing, sneezing, and touching contaminated objects. SIGNS AND SYMPTOMS   Cough.   Fever.   Coughing up mucus.   Body aches.   Chest congestion.   Chills.   Shortness of breath.   Sore throat.  DIAGNOSIS  Acute bronchitis is usually diagnosed through a physical exam. Your health care provider will also ask you questions about your medical history. Tests, such as chest X-rays, are sometimes done to rule out other conditions.  TREATMENT  Acute bronchitis usually goes away in a couple weeks. Oftentimes, no medical treatment is necessary. Medicines are sometimes given for relief of fever or cough. Antibiotic medicines are usually not needed but may be prescribed in certain situations. In some cases, an inhaler may be recommended to help reduce shortness of breath and control the cough. A cool mist vaporizer may also be used to help thin bronchial secretions and make it easier to clear the chest.  HOME CARE INSTRUCTIONS  Get plenty of rest.   Drink enough fluids to keep your urine clear or pale yellow (unless you have a medical condition that requires fluid restriction). Increasing fluids may help thin your respiratory secretions (sputum) and reduce chest congestion, and it will prevent dehydration.   Take medicines only as directed by your health care provider.  If  you were prescribed an antibiotic medicine, finish it all even if you start to feel better.  Avoid smoking and secondhand smoke. Exposure to cigarette smoke or irritating chemicals will make bronchitis worse. If you are a smoker, consider using nicotine gum or skin patches to help control withdrawal symptoms. Quitting smoking will help your lungs heal faster.   Reduce the chances of another bout of acute bronchitis by washing your hands frequently, avoiding people with cold symptoms, and trying not to touch your hands to your mouth, nose, or eyes.   Keep all follow-up visits as directed by your health care provider.  SEEK MEDICAL CARE IF: Your symptoms do not improve after 1 week of treatment.  SEEK IMMEDIATE MEDICAL CARE IF:  You develop an increased fever or chills.   You have chest pain.   You have severe shortness of breath.  You have bloody sputum.   You develop dehydration.  You faint or repeatedly feel like you are going to pass out.  You develop repeated vomiting.  You develop a severe headache. MAKE SURE YOU:   Understand these instructions.  Will watch your condition.  Will get help right away if you are not doing well or get worse. Document Released: 02/19/2004 Document Revised: 05/28/2013 Document Reviewed: 07/04/2012 [facility] Patient Information 2015 [place], [place]. This information is not intended to replace advice given to you by your health care provider. Make sure you discuss any questions you have with your health care provider.   Take your next dose of the antibiotic tomorrow morning.  Take your home inhaler - 2 puffs every 4 hours if you  are wheezing or coughing.

## 2014-07-22 NOTE — ED Provider Notes (Signed)
CSN: [id number]     Arrival date & time 07/22/14  1319 History  This chart was scribed for non-physician practitioner, Burgess AmorJulie Aydee Mcnew, PA-C working with Bethann BerkshireJoseph Zammit, MD by Gwenyth Oberatherine Macek, ED scribe. This patient was seen in room APFT22/APFT22 and the patient's care was started at 3:23 PM   Chief Complaint  Patient presents with  . Cough   The history is provided by the patient. No language interpreter was used.   HPI Comments: Joyce Ward is a 29 y.o. female who presents to the Emergency Department complaining of intermittent, moderate productive cough with yellow sputum that started 4-5 days ago. She states chest congestion, fever of 102.3, mild nasal congestion, sore throat, mild wheezing and 1 episode of post-tussive vomiting as associated symptoms. Pt also describes bilateral burning chest pain that occurs with coughing and deep breaths. Pt tried Motrin PTA with no relief. She smokes cigarettes. Pt denies recent long travel. She also denies rhinorrhea, ear pain, SOB, leg swelling, calf pain, abdominal pain, nausea and diarrhea as associated symptoms.  Past Medical History  Diagnosis Date  . Endometriosis   . GERD (gastroesophageal reflux disease)    History reviewed. No pertinent past surgical history. History reviewed. No pertinent family history. History  Substance Use Topics  . Smoking status: Current Some Day Smoker -- 1.00 packs/day    Types: Cigarettes  . Smokeless tobacco: Not on file  . Alcohol Use: Yes     Comment: occasional (yesterday)   OB History    Gravida Para Term Preterm AB TAB SAB Ectopic Multiple Living            2     Review of Systems  Constitutional: Positive for fever and chills.  HENT: Positive for congestion and sore throat. Negative for ear pain, rhinorrhea, sinus pressure, trouble swallowing and voice change.   Eyes: Negative for discharge.  Respiratory: Positive for cough and wheezing. Negative for shortness of breath and stridor.    Cardiovascular: Positive for chest pain. Negative for leg swelling.  Gastrointestinal: Negative for nausea, abdominal pain and diarrhea.  Genitourinary: Negative.    Allergies  Codeine; Latex; Toradol; and Tramadol  Home Medications   Prior to Admission medications   Medication Sig Start Date End Date Taking? Authorizing Provider  ibuprofen (ADVIL,MOTRIN) 800 MG tablet Take 800 mg by mouth every 8 (eight) hours as needed for headache, mild pain or moderate pain. For moderate pain 05/08/14  Yes Historical Provider, MD  azithromycin (ZITHROMAX) 250 MG tablet Take one tablet daily for 4 days starting 07/23/14 07/22/14   Burgess AmorJulie Lazarus [place], PA-C  chlorpheniramine-HYDROcodone [facility](TUSSIONEX PENNKINETIC ER) 10-8 MG/5ML SUER Take 5 mLs by mouth every 12 (twelve) hours as needed for cough. 07/22/14   Burgess AmorJulie Frederic Tones, PA-C  HYDROcodone-acetaminophen (NORCO/VICODIN) 5-325 MG per tablet Take 2 tablets by mouth every 4 (four) hours as needed. Patient not taking: Reported on 07/09/2014 07/02/14   Eber HongBrian Miller, MD   BP 97/57 mmHg  Pulse 101  Temp(Src) 98.1 F (36.7 C) (Oral)  Resp 18  Ht 5\' 3"  (1.6 m)  Wt 118 lb (53.524 kg)  BMI 20.91 kg/m2  SpO2 100%  LMP 06/11/2014 Physical Exam  Constitutional: She appears well-developed and well-nourished. No distress.  HENT:  Head: Normocephalic and atraumatic.  Eyes: Conjunctivae and EOM are normal.  Neck: Neck supple. No tracheal deviation present.  Cardiovascular: Normal rate, regular rhythm and normal heart sounds.   Pulmonary/Chest: Effort normal and breath sounds normal. No respiratory distress. She exhibits tenderness.  TTP upper  anterior chestwall bilaterally  Musculoskeletal: She exhibits no edema.  no calf or ankle edema  Skin: Skin is warm and dry.  Psychiatric: She has a normal mood and affect. Her behavior is normal.  Nursing note and vitals reviewed.   ED Course  Procedures   DIAGNOSTIC STUDIES: Oxygen Saturation is 100% on RA, normal by my  interpretation.    COORDINATION OF CARE: 3:29 PM Discussed x-ray results, suspicion for bronchitis and treatment plan with pt which includes Zithromax and cough suppressant. Advised to use 2 puffs of at-home inhaler, as needed. Discussed with pt increased risk for respiratory infections from tobacco use. Advised pt to return for continued fever or SOB. Pt agreed to plan.  Labs Review Labs Reviewed - No data to display  Imaging Review No results found.   EKG Interpretation None      MDM   Final diagnoses:  Acute bronchitis, unspecified organism  Smoking    Patients labs and/or radiological studies were reviewed and considered during the medical decision making and disposition process.  Results were also discussed with patient. Pt was prescribed zithromax, tussionex, first dose given here. Advised recheck for any worsened sx. Rest, increased fluid intake.   Pt with bilateral burning chest pain only triggered by cough, not present other times, not c/w PE.  cxr negative for pneumonia but will cover pt with zithromax given reported high fever and smoking hx.  Referrals given for establishing pcp.  Pt reports moved down here 2 months ago from [place], 9 visits to the ed in that time.  Plans to move home soon.  I personally performed the services described in this documentation, which was scribed in my presence. The recorded information has been reviewed and is accurate.   Burgess Amor, PA-C 07/24/14 2009  Bethann Berkshire, MD 07/26/14 [phone]

## 2014-07-25 ENCOUNTER — Emergency Department (HOSPITAL_COMMUNITY)
Admission: EM | Admit: 2014-07-25 | Discharge: 2014-07-25 | Disposition: A | Discharge status: Home / Self Care | Payer: Medicaid Other | Attending: Emergency Medicine | Admitting: Emergency Medicine

## 2014-07-25 ENCOUNTER — Emergency Department (HOSPITAL_COMMUNITY): Payer: Medicaid Other

## 2014-07-25 ENCOUNTER — Encounter (HOSPITAL_COMMUNITY): Payer: Self-pay | Admitting: Emergency Medicine

## 2014-07-25 DIAGNOSIS — Z8719 Personal history of other diseases of the digestive system: Secondary | ICD-10-CM | POA: Insufficient documentation

## 2014-07-25 DIAGNOSIS — Z720 Tobacco use: Secondary | ICD-10-CM | POA: Insufficient documentation

## 2014-07-25 DIAGNOSIS — R05 Cough: Secondary | ICD-10-CM

## 2014-07-25 DIAGNOSIS — R509 Fever, unspecified: Secondary | ICD-10-CM | POA: Diagnosis present

## 2014-07-25 DIAGNOSIS — J069 Acute upper respiratory infection, unspecified: Secondary | ICD-10-CM | POA: Diagnosis not present

## 2014-07-25 DIAGNOSIS — Z8742 Personal history of other diseases of the female genital tract: Secondary | ICD-10-CM | POA: Insufficient documentation

## 2014-07-25 DIAGNOSIS — R059 Cough, unspecified: Secondary | ICD-10-CM

## 2014-07-25 DIAGNOSIS — R079 Chest pain, unspecified: Secondary | ICD-10-CM | POA: Diagnosis not present

## 2014-07-25 LAB — CBC
HCT: 46.1 % — ABNORMAL HIGH (ref 36.0–46.0)
HEMOGLOBIN: 15.6 g/dL — AB (ref 12.0–15.0)
MCH: 32.9 pg (ref 26.0–34.0)
MCHC: 33.8 g/dL (ref 30.0–36.0)
MCV: 97.3 fL (ref 78.0–100.0)
Platelets: 388 10*3/uL (ref 150–400)
RBC: 4.74 MIL/uL (ref 3.87–5.11)
RDW: 12.8 % (ref 11.5–15.5)
WBC: 15.3 10*3/uL — ABNORMAL HIGH (ref 4.0–10.5)

## 2014-07-25 LAB — BASIC METABOLIC PANEL
Anion gap: 10 (ref 5–15)
BUN: 12 mg/dL (ref 6–20)
CHLORIDE: 106 mmol/L (ref 101–111)
CO2: 24 mmol/L (ref 22–32)
CREATININE: 0.77 mg/dL (ref 0.44–1.00)
Calcium: 9.3 mg/dL (ref 8.9–10.3)
GFR calc Af Amer: >60 mL/min (ref >60)
Glucose, Bld: 86 mg/dL (ref 65–99)
Potassium: 3.8 mmol/L (ref 3.5–5.1)
Sodium: 140 mmol/L (ref 135–145)

## 2014-07-25 LAB — D-DIMER, QUANTITATIVE (NOT AT ARMC)

## 2014-07-25 MED ORDER — BENZONATATE 100 MG PO CAPS: 100.0000 mg | ORAL_CAPSULE | Freq: Three times a day (TID) | ORAL | Status: DC

## 2014-07-25 MED ORDER — NAPROXEN 500 MG PO TABS: 500.0000 mg | ORAL_TABLET | Freq: Once | ORAL | Status: DC

## 2014-07-25 NOTE — ED Provider Notes (Signed)
CSN: [id number]     Arrival date & time 07/25/14  1240 History   First MD Initiated Contact with Patient 07/25/14 1311     Chief Complaint  Patient presents with  . Cough  . Fever   Patient is a 29 y.o. female presenting with cough and fever. The history is provided by the patient.  Cough Cough characteristics:  Productive Sputum characteristics:  Yellow Severity:  Moderate Onset quality:  Gradual Duration:  7 days Timing:  Constant Progression since onset: She was seen in the ED on June 27th.  She does not feel like she is getting better. Smoker: yes   Relieved by:  Nothing Exacerbated by: coughing. Associated symptoms: chest pain, fever, shortness of breath and sinus congestion   Associated symptoms: no rash and no rhinorrhea   Fever Associated symptoms: chest pain and cough   Associated symptoms: no rash and no rhinorrhea   She denies trouble with leg swelling.  No history of PE or DVT.  She has not been smoking since coming down with this illness.  Past Medical History  Diagnosis Date  . Endometriosis   . GERD (gastroesophageal reflux disease)    History reviewed. No pertinent past surgical history. History reviewed. No pertinent family history. History  Substance Use Topics  . Smoking status: Current Some Day Smoker -- 1.00 packs/day    Types: Cigarettes  . Smokeless tobacco: Not on file  . Alcohol Use: Yes     Comment: occasional (yesterday)   OB History    Gravida Para Term Preterm AB TAB SAB Ectopic Multiple Living            2     Review of Systems  Constitutional: Positive for fever.  HENT: Negative for rhinorrhea.   Respiratory: Positive for cough and shortness of breath.   Cardiovascular: Positive for chest pain.  Skin: Negative for rash.  All other systems reviewed and are negative.     Allergies  Codeine; Latex; Toradol; and Tramadol  Home Medications   Prior to Admission medications   Medication Sig Start Date End Date Taking? Authorizing  Provider  azithromycin (ZITHROMAX) 250 MG tablet Take one tablet daily for 4 days starting 07/23/14 Patient not taking: Reported on 07/25/2014 07/22/14   Burgess AmorJulie Idol, PA-C  benzonatate (TESSALON) 100 MG capsule Take 1 capsule (100 mg total) by mouth every 8 (eight) hours. 07/25/14   Linwood DibblesJon Kwasi Joung, MD   BP 101/64 mmHg  Pulse 92  Temp(Src) 98.4 F (36.9 C) (Oral)  Resp 15  SpO2 100%  LMP 07/13/2014 (Approximate) Physical Exam  Constitutional: She appears well-developed and well-nourished. No distress.  HENT:  Head: Normocephalic and atraumatic.  Right Ear: External ear normal.  Left Ear: External ear normal.  Eyes: Conjunctivae are normal. Right eye exhibits no discharge. Left eye exhibits no discharge. No scleral icterus.  Neck: Neck supple. No tracheal deviation present.  Cardiovascular: Normal rate, regular rhythm and intact distal pulses.   Pulmonary/Chest: Effort normal and breath sounds normal. No stridor. No respiratory distress. She has no wheezes. She has no rales.  Abdominal: Soft. Bowel sounds are normal. She exhibits no distension. There is no tenderness. There is no rebound and no guarding.  Musculoskeletal: She exhibits no edema or tenderness.  Neurological: She is alert. She has normal strength. No cranial nerve deficit (no facial droop, extraocular movements intact, no slurred speech) or sensory deficit. She exhibits normal muscle tone. She displays no seizure activity. Coordination normal.  Skin: Skin is warm and  dry. No rash noted.  Psychiatric: She has a normal mood and affect.  Nursing note and vitals reviewed.   ED Course  Procedures (including critical care time) Labs Review Labs Reviewed  CBC - Abnormal; Notable for the following:    WBC 15.3 (*)    Hemoglobin 15.6 (*)    HCT 46.1 (*)    All other components within normal limits  BASIC METABOLIC PANEL  D-DIMER, QUANTITATIVE (NOT AT [facility])  I-STAT CG4 LACTIC ACID, ED    Imaging Review Dg Chest Port 1  View  07/25/2014   CLINICAL DATA:  29 year old female with a history of cough  EXAM: PORTABLE CHEST - 1 VIEW  COMPARISON:  07/22/2014  FINDINGS: The heart size and mediastinal contours are within normal limits. Both lungs are clear. The visualized skeletal structures are unremarkable.  IMPRESSION: Negative for acute cardiopulmonary disease.  Signed,  Yvone Neu. Loreta Ave, DO  [facility]  [facility]   Electronically Signed   By: Gilmer Mor D.O.   On: 07/25/2014 13:21     MDM   Final diagnoses:  URI, acute    Labs are reassuring.  NO pna on xray.  Doubt PE.  Proable uri.  Discussed use of non narcotic pain medications   Linwood Dibbles, MD 07/25/14 1531

## 2014-07-25 NOTE — Progress Notes (Signed)
pcp is [facility] [id number] [place], [place] [id number] [phone]

## 2014-07-25 NOTE — ED Notes (Addendum)
Pt reports cough and Tmax 103.2. Took Ibuprofen around an hour ago (800 mg). When attempting an EKG, denies specific chest pain but says "my chest burns when I cough." Says she is having grey and yellow phlegm. Has had pneumonia in the past and says this feels similar. No other symptoms reported. RR even/unlabored. Has been using breathing treatments at home. Symptoms x5 days.

## 2014-07-25 NOTE — ED Notes (Signed)
Patient states she does not want an EKG due to having acid reflix pain instead.

## 2014-07-25 NOTE — Discharge Instructions (Signed)
Cough, Adult  A cough is a reflex that helps clear your throat and airways. It can help heal the body or may be a reaction to an irritated airway. A cough may only last 2 or 3 weeks (acute) or may last more than 8 weeks (chronic).  CAUSES Acute cough:  Viral or bacterial infections. Chronic cough:  Infections.  Allergies.  Asthma.  Post-nasal drip.  Smoking.  Heartburn or acid reflux.  Some medicines.  Chronic lung problems (COPD).  Cancer. SYMPTOMS   Cough.  Fever.  Chest pain.  Increased breathing rate.  High-pitched whistling sound when breathing (wheezing).  Colored mucus that you cough up (sputum). TREATMENT   A bacterial cough may be treated with antibiotic medicine.  A viral cough must run its course and will not respond to antibiotics.  Your caregiver may recommend other treatments if you have a chronic cough. HOME CARE INSTRUCTIONS   Only take over-the-counter or prescription medicines for pain, discomfort, or fever as directed by your caregiver. Use cough suppressants only as directed by your caregiver.  Use a cold steam vaporizer or humidifier in your bedroom or home to help loosen secretions.  Sleep in a semi-upright position if your cough is worse at night.  Rest as needed.  Stop smoking if you smoke. SEEK IMMEDIATE MEDICAL CARE IF:   You have pus in your sputum.  Your cough starts to worsen.  You cannot control your cough with suppressants and are losing sleep.  You begin coughing up blood.  You have difficulty breathing.  You develop pain which is getting worse or is uncontrolled with medicine.  You have a fever. MAKE SURE YOU:   Understand these instructions.  Will watch your condition.  Will get help right away if you are not doing well or get worse. Document Released: 07/10/2010 Document Revised: 04/05/2011 Document Reviewed: 07/10/2010 ExitCare Patient Information 2015 [employer]. This information is not intended  to replace advice given to you by your health care provider. Make sure you discuss any questions you have with your health care provider.  

## 2014-08-02 ENCOUNTER — Encounter: Payer: Medicaid Other | Admitting: Obstetrics & Gynecology

## 2014-08-03 ENCOUNTER — Encounter (HOSPITAL_COMMUNITY): Payer: Self-pay | Admitting: *Deleted

## 2014-08-03 ENCOUNTER — Emergency Department (HOSPITAL_COMMUNITY)
Admission: EM | Admit: 2014-08-03 | Discharge: 2014-08-03 | Disposition: A | Discharge status: Home / Self Care | Payer: Medicaid Other | Attending: Emergency Medicine | Admitting: Emergency Medicine

## 2014-08-03 ENCOUNTER — Emergency Department (HOSPITAL_COMMUNITY): Payer: Medicaid Other

## 2014-08-03 DIAGNOSIS — Y9389 Activity, other specified: Secondary | ICD-10-CM | POA: Diagnosis not present

## 2014-08-03 DIAGNOSIS — Z8742 Personal history of other diseases of the female genital tract: Secondary | ICD-10-CM | POA: Insufficient documentation

## 2014-08-03 DIAGNOSIS — Z8719 Personal history of other diseases of the digestive system: Secondary | ICD-10-CM | POA: Diagnosis not present

## 2014-08-03 DIAGNOSIS — W228XXA Striking against or struck by other objects, initial encounter: Secondary | ICD-10-CM | POA: Diagnosis not present

## 2014-08-03 DIAGNOSIS — Y9289 Other specified places as the place of occurrence of the external cause: Secondary | ICD-10-CM | POA: Diagnosis not present

## 2014-08-03 DIAGNOSIS — Z720 Tobacco use: Secondary | ICD-10-CM | POA: Diagnosis not present

## 2014-08-03 DIAGNOSIS — S60221A Contusion of right hand, initial encounter: Secondary | ICD-10-CM

## 2014-08-03 DIAGNOSIS — Z91040 Latex allergy status: Secondary | ICD-10-CM | POA: Insufficient documentation

## 2014-08-03 DIAGNOSIS — Y998 Other external cause status: Secondary | ICD-10-CM | POA: Diagnosis not present

## 2014-08-03 DIAGNOSIS — S6991XA Unspecified injury of right wrist, hand and finger(s), initial encounter: Secondary | ICD-10-CM | POA: Diagnosis present

## 2014-08-03 MED ORDER — HYDROCODONE-ACETAMINOPHEN 5-325 MG PO TABS
2.0000 | ORAL_TABLET | Freq: Once | ORAL | Status: AC
  Administered 2014-08-03: 2 via ORAL
  Filled 2014-08-03: qty 2

## 2014-08-03 MED ORDER — HYDROCODONE-ACETAMINOPHEN 5-325 MG PO TABS: 2.0000 | ORAL_TABLET | ORAL | Status: DC | PRN

## 2014-08-03 NOTE — Discharge Instructions (Signed)
Contusion °A contusion is a deep bruise. Contusions are the result of an injury that caused bleeding under the skin. The contusion may turn blue, purple, or yellow. Minor injuries will give you a painless contusion, but more severe contusions may stay painful and swollen for a few weeks.  °CAUSES  °A contusion is usually caused by a blow, trauma, or direct force to an area of the body. °SYMPTOMS  °· Swelling and redness of the injured area. °· Bruising of the injured area. °· Tenderness and soreness of the injured area. °· Pain. °DIAGNOSIS  °The diagnosis can be made by taking a history and physical exam. An X-ray, CT scan, or MRI may be needed to determine if there were any associated injuries, such as fractures. °TREATMENT  °Specific treatment will depend on what area of the body was injured. In general, the best treatment for a contusion is resting, icing, elevating, and applying cold compresses to the injured area. Over-the-counter medicines may also be recommended for pain control. Ask your caregiver what the best treatment is for your contusion. °HOME CARE INSTRUCTIONS  °· Put ice on the injured area. °¨ Put ice in a plastic bag. °¨ Place a towel between your skin and the bag. °¨ Leave the ice on for 15-20 minutes, 3-4 times a day, or as directed by your health care provider. °· Only take over-the-counter or prescription medicines for pain, discomfort, or fever as directed by your caregiver. Your caregiver may recommend avoiding anti-inflammatory medicines (aspirin, ibuprofen, and naproxen) for 48 hours because these medicines may increase bruising. °· Rest the injured area. °· If possible, elevate the injured area to reduce swelling. °SEEK IMMEDIATE MEDICAL CARE IF:  °· You have increased bruising or swelling. °· You have pain that is getting worse. °· Your swelling or pain is not relieved with medicines. °MAKE SURE YOU:  °· Understand these instructions. °· Will watch your condition. °· Will get help right  away if you are not doing well or get worse. °Document Released: 10/21/2004 Document Revised: 01/16/2013 Document Reviewed: 11/16/2010 °ExitCare® Patient Information ©2015 [employer]. This information is not intended to replace advice given to you by your health care provider. Make sure you discuss any questions you have with your health care provider. ° °
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
## 2014-08-13 ENCOUNTER — Emergency Department (HOSPITAL_COMMUNITY): Payer: Medicaid Other

## 2014-08-13 ENCOUNTER — Encounter (HOSPITAL_COMMUNITY): Payer: Self-pay | Admitting: Emergency Medicine

## 2014-08-13 ENCOUNTER — Emergency Department (HOSPITAL_COMMUNITY)
Admission: EM | Admit: 2014-08-13 | Discharge: 2014-08-13 | Disposition: A | Discharge status: Home / Self Care | Payer: Medicaid Other | Attending: Emergency Medicine | Admitting: Emergency Medicine

## 2014-08-13 DIAGNOSIS — Z79899 Other long term (current) drug therapy: Secondary | ICD-10-CM | POA: Diagnosis not present

## 2014-08-13 DIAGNOSIS — N939 Abnormal uterine and vaginal bleeding, unspecified: Secondary | ICD-10-CM | POA: Diagnosis not present

## 2014-08-13 DIAGNOSIS — Z91040 Latex allergy status: Secondary | ICD-10-CM | POA: Diagnosis not present

## 2014-08-13 DIAGNOSIS — R102 Pelvic and perineal pain: Secondary | ICD-10-CM

## 2014-08-13 DIAGNOSIS — Z720 Tobacco use: Secondary | ICD-10-CM | POA: Insufficient documentation

## 2014-08-13 DIAGNOSIS — Z8719 Personal history of other diseases of the digestive system: Secondary | ICD-10-CM | POA: Diagnosis not present

## 2014-08-13 DIAGNOSIS — Z3202 Encounter for pregnancy test, result negative: Secondary | ICD-10-CM | POA: Insufficient documentation

## 2014-08-13 DIAGNOSIS — R1030 Lower abdominal pain, unspecified: Secondary | ICD-10-CM | POA: Diagnosis present

## 2014-08-13 LAB — CBC WITH DIFFERENTIAL/PLATELET
BASOS ABS: 0 10*3/uL (ref 0.0–0.1)
Basophils Relative: 0 % (ref 0–1)
EOS ABS: 0.1 10*3/uL (ref 0.0–0.7)
EOS PCT: 1 % (ref 0–5)
HCT: 41.8 % (ref 36.0–46.0)
HEMOGLOBIN: 14.1 g/dL (ref 12.0–15.0)
LYMPHS PCT: 26 % (ref 12–46)
Lymphs Abs: 3.7 10*3/uL (ref 0.7–4.0)
MCH: 32.7 pg (ref 26.0–34.0)
MCHC: 33.7 g/dL (ref 30.0–36.0)
MCV: 97 fL (ref 78.0–100.0)
MONOS PCT: 5 % (ref 3–12)
Monocytes Absolute: 0.7 10*3/uL (ref 0.1–1.0)
NEUTROS ABS: 9.6 10*3/uL — AB (ref 1.7–7.7)
Neutrophils Relative %: 68 % (ref 43–77)
Platelets: 350 10*3/uL (ref 150–400)
RBC: 4.31 MIL/uL (ref 3.87–5.11)
RDW: 12.5 % (ref 11.5–15.5)
WBC: 14.1 10*3/uL — AB (ref 4.0–10.5)

## 2014-08-13 LAB — URINALYSIS, ROUTINE W REFLEX MICROSCOPIC
Bilirubin Urine: NEGATIVE
Glucose, UA: NEGATIVE mg/dL
Ketones, ur: NEGATIVE mg/dL
LEUKOCYTES UA: NEGATIVE
NITRITE: NEGATIVE
PH: 6 (ref 5.0–8.0)
Protein, ur: NEGATIVE mg/dL
SPECIFIC GRAVITY, URINE: 1.025 (ref 1.005–1.030)
Urobilinogen, UA: 0.2 mg/dL (ref 0.0–1.0)

## 2014-08-13 LAB — COMPREHENSIVE METABOLIC PANEL
ALT: 8 U/L — AB (ref 14–54)
AST: 12 U/L — AB (ref 15–41)
Albumin: 4 g/dL (ref 3.5–5.0)
Alkaline Phosphatase: 73 U/L (ref 38–126)
Anion gap: 5 (ref 5–15)
BILIRUBIN TOTAL: 0.4 mg/dL (ref 0.3–1.2)
BUN: 12 mg/dL (ref 6–20)
CALCIUM: 8.2 mg/dL — AB (ref 8.9–10.3)
CO2: 27 mmol/L (ref 22–32)
CREATININE: 0.71 mg/dL (ref 0.44–1.00)
Chloride: 108 mmol/L (ref 101–111)
GFR calc Af Amer: >60 mL/min (ref >60)
GFR calc non Af Amer: >60 mL/min (ref >60)
GLUCOSE: 87 mg/dL (ref 65–99)
POTASSIUM: 3.6 mmol/L (ref 3.5–5.1)
SODIUM: 140 mmol/L (ref 135–145)
Total Protein: 6.9 g/dL (ref 6.5–8.1)

## 2014-08-13 LAB — URINE MICROSCOPIC-ADD ON

## 2014-08-13 LAB — WET PREP, GENITAL
Trich, Wet Prep: NONE SEEN
Yeast Wet Prep HPF POC: NONE SEEN

## 2014-08-13 LAB — PREGNANCY, URINE: PREG TEST UR: NEGATIVE

## 2014-08-13 MED ORDER — IOHEXOL 300 MG/ML  SOLN
100.0000 mL | Freq: Once | INTRAMUSCULAR | Status: AC | PRN
  Administered 2014-08-13: 100 mL via INTRAVENOUS

## 2014-08-13 MED ORDER — HYDROCODONE-ACETAMINOPHEN 5-325 MG PO TABS
1.0000 | ORAL_TABLET | Freq: Once | ORAL | Status: AC
  Administered 2014-08-13: 1 via ORAL
  Filled 2014-08-13: qty 1

## 2014-08-13 MED ORDER — IBUPROFEN 600 MG PO TABS: 600.0000 mg | ORAL_TABLET | Freq: Four times a day (QID) | ORAL | Status: DC | PRN

## 2014-08-13 MED ORDER — HYDROCODONE-ACETAMINOPHEN 5-325 MG PO TABS: 1.0000 | ORAL_TABLET | ORAL | Status: DC | PRN

## 2014-08-13 NOTE — ED Notes (Signed)
Pt given disp medication.  Pt verbalizes understanding of disp medication orders, care given, and followup care. Ambulated out of ED

## 2014-08-13 NOTE — ED Provider Notes (Signed)
CSN: [id number]     Arrival date & time 08/13/14  0239 History   First MD Initiated Contact with Patient 08/13/14 0256     Chief Complaint  Patient presents with  . Abdominal Pain     (Consider location/radiation/quality/duration/timing/severity/associated sxs/prior Treatment) HPI Patient states she has a history of lower abdominal pain due to endometriosis. This is usually worsened when she is on her period. She states she started her period this evening. Associated with intense lower abdominal pain. No radiation. No nausea or vomiting. States she took 800 mg of Motrin without relief. She has yet to see an OB/GYN in the area. States she is waiting for Medicare list of specialists to arrive. Has been seen several times in the emergency department with similar complaints. Denies any vaginal discharge. No fever or chills. No urinary symptoms. Past Medical History  Diagnosis Date  . Endometriosis   . GERD (gastroesophageal reflux disease)    History reviewed. No pertinent past surgical history. History reviewed. No pertinent family history. History  Substance Use Topics  . Smoking status: Current Some Day Smoker -- 1.00 packs/day    Types: Cigarettes  . Smokeless tobacco: Not on file  . Alcohol Use: Yes     Comment: occasional (yesterday)   OB History    Gravida Para Term Preterm AB TAB SAB Ectopic Multiple Living            2     Review of Systems  Constitutional: Negative for fever and chills.  Respiratory: Negative for shortness of breath.   Cardiovascular: Negative for chest pain.  Gastrointestinal: Positive for abdominal pain. Negative for nausea, vomiting, diarrhea and constipation.  Genitourinary: Positive for vaginal bleeding and pelvic pain. Negative for dysuria, flank pain and vaginal discharge.  Musculoskeletal: Negative for back pain, neck pain and neck stiffness.  Skin: Negative for rash and wound.  Neurological: Negative for dizziness, weakness, light-headedness,  numbness and headaches.  All other systems reviewed and are negative.     Allergies  Codeine; Latex; Toradol; and Tramadol  Home Medications   Prior to Admission medications   Medication Sig Start Date End Date Taking? Authorizing Provider  azithromycin (ZITHROMAX) 250 MG tablet Take one tablet daily for 4 days starting 07/23/14 Patient not taking: Reported on 07/25/2014 07/22/14   Burgess AmorJulie Idol, PA-C  benzonatate (TESSALON) 100 MG capsule Take 1 capsule (100 mg total) by mouth every 8 (eight) hours. 07/25/14   Linwood DibblesJon Knapp, MD  HYDROcodone-acetaminophen (NORCO/VICODIN) 5-325 MG per tablet Take 2 tablets by mouth every 4 (four) hours as needed. 08/03/14   Elson AreasLeslie K Sofia, PA-C  HYDROcodone-acetaminophen (NORCO/VICODIN) 5-325 MG per tablet Take 1-2 tablets by mouth every 4 (four) hours as needed for severe pain. 08/13/14   Loren Raceravid Maelys Kinnick, MD   BP 117/80 mmHg  Pulse 73  Temp(Src) 98.3 F (36.8 C)  Resp 18  Ht 5\' 3"  (1.6 m)  Wt 118 lb (53.524 kg)  BMI 20.91 kg/m2  SpO2 100%  LMP 08/13/2014 Physical Exam  Constitutional: She is oriented to person, place, and time. She appears well-developed and well-nourished. No distress.  Appears uncomfortable  HENT:  Head: Normocephalic and atraumatic.  Mouth/Throat: Oropharynx is clear and moist.  Eyes: EOM are normal. Pupils are equal, round, and reactive to light.  Neck: Normal range of motion. Neck supple.  Cardiovascular: Normal rate and regular rhythm.   Pulmonary/Chest: Effort normal and breath sounds normal. No respiratory distress. She has no wheezes. She has no rales.  Abdominal: Soft. Bowel  sounds are normal. There is tenderness (mild diffuse lower abdominal tenderness without focality, rebound or guarding.).  Genitourinary:  Mild amount of blood in the vaginal vault. Cervix is posterior facing. No cervical motion tenderness. Diffuse adnexal and fundal tenderness.  Musculoskeletal: Normal range of motion. She exhibits no edema or tenderness.   No CVA tenderness bilaterally.  Neurological: She is alert and oriented to person, place, and time.  Skin: Skin is warm and dry. No rash noted. No erythema.  Psychiatric: She has a normal mood and affect. Her behavior is normal.  Nursing note and vitals reviewed.   ED Course  Procedures (including critical care time) Labs Review Labs Reviewed  WET PREP, GENITAL - Abnormal; Notable for the following:    Clue Cells Wet Prep HPF POC FEW (*)    WBC, Wet Prep HPF POC FEW (*)    All other components within normal limits  CBC WITH DIFFERENTIAL/PLATELET - Abnormal; Notable for the following:    WBC 14.1 (*)    Neutro Abs 9.6 (*)    All other components within normal limits  COMPREHENSIVE METABOLIC PANEL - Abnormal; Notable for the following:    Calcium 8.2 (*)    AST 12 (*)    ALT 8 (*)    All other components within normal limits  URINALYSIS, ROUTINE W REFLEX MICROSCOPIC (NOT AT [facility]) - Abnormal; Notable for the following:    Hgb urine dipstick SMALL (*)    All other components within normal limits  URINE MICROSCOPIC-ADD ON - Abnormal; Notable for the following:    Squamous Epithelial / LPF MANY (*)    Bacteria, UA MANY (*)    All other components within normal limits  PREGNANCY, URINE  GC/CHLAMYDIA PROBE AMP ([place]) NOT AT [facility]    Imaging Review Ct Pelvis W Contrast  08/13/2014   CLINICAL DATA:  Acute onset of vaginal bleeding and severe pelvic cramping. Initial encounter.  EXAM: CT PELVIS WITH CONTRAST  TECHNIQUE: Multidetector CT imaging of the pelvis was performed using the standard protocol following the bolus administration of intravenous contrast.  CONTRAST:  OMNIPAQUE IOHEXOL 300 MG/ML  SOLN  COMPARISON:  Left hip radiographs performed 07/02/2014  FINDINGS: The uterus is grossly unremarkable in appearance. No suspicious adnexal masses are seen. Visualized small and large bowel loops are grossly unremarkable. No significant free fluid is seen. No acute findings are  seen to explain the patient's symptoms.  The bladder is mildly distended and grossly unremarkable. No acute osseous abnormalities are seen.  IMPRESSION: Unremarkable contrast-enhanced CT of the pelvis.   Electronically Signed   By: Roanna Raider M.D.   On: 08/13/2014 05:53     EKG Interpretation None      MDM   Final diagnoses:  Pelvic pain in female   Pain improved in ED. Mild elevation of WBC of uncertain significance. Normal CT pelvis. Low suspicion for torsion. Advised to f/u with OBGYN. Return precautions given.      Loren Racer, MD 08/13/14 [phone]

## 2014-08-13 NOTE — ED Notes (Signed)
Pt c/o severe lower abd pain that started this am with vaginal bleeding.

## 2014-08-13 NOTE — Discharge Instructions (Signed)
Pelvic Pain Female pelvic pain can be caused by many different things and start from a variety of places. Pelvic pain refers to pain that is located in the lower half of the abdomen and between your hips. The pain may occur over a short period of time (acute) or may be reoccurring (chronic). The cause of pelvic pain may be related to disorders affecting the female reproductive organs (gynecologic), but it may also be related to the bladder, kidney stones, an intestinal complication, or muscle or skeletal problems. Getting help right away for pelvic pain is important, especially if there has been severe, sharp, or a sudden onset of unusual pain. It is also important to get help right away because some types of pelvic pain can be life threatening.  CAUSES  Below are only some of the causes of pelvic pain. The causes of pelvic pain can be in one of several categories.   Gynecologic.  Pelvic inflammatory disease.  Sexually transmitted infection.  Ovarian cyst or a twisted ovarian ligament (ovarian torsion).  Uterine lining that grows outside the uterus (endometriosis).  Fibroids, cysts, or tumors.  Ovulation.  Pregnancy.  Pregnancy that occurs outside the uterus (ectopic pregnancy).  Miscarriage.  Labor.  Abruption of the placenta or ruptured uterus.  Infection.  Uterine infection (endometritis).  Bladder infection.  Diverticulitis.  Miscarriage related to a uterine infection (septic abortion).  Bladder.  Inflammation of the bladder (cystitis).  Kidney stone(s).  Gastrointestinal.  Constipation.  Diverticulitis.  Neurologic.  Trauma.  Feeling pelvic pain because of mental or emotional causes (psychosomatic).  Cancers of the bowel or pelvis. EVALUATION  Your caregiver will want to take a careful history of your concerns. This includes recent changes in your health, a careful gynecologic history of your periods (menses), and a sexual history. Obtaining your family  history and medical history is also important. Your caregiver may suggest a pelvic exam. A pelvic exam will help identify the location and severity of the pain. It also helps in the evaluation of which organ system may be involved. In order to identify the cause of the pelvic pain and be properly treated, your caregiver may order tests. These tests may include:   A pregnancy test.  Pelvic ultrasonography.  An X-ray exam of the abdomen.  A urinalysis or evaluation of vaginal discharge.  Blood tests. HOME CARE INSTRUCTIONS   Only take over-the-counter or prescription medicines for pain, discomfort, or fever as directed by your caregiver.   Rest as directed by your caregiver.   Eat a balanced diet.   Drink enough fluids to make your urine clear or pale yellow, or as directed.   Avoid sexual intercourse if it causes pain.   Apply warm or cold compresses to the lower abdomen depending on which one helps the pain.   Avoid stressful situations.   Keep a journal of your pelvic pain. Write down when it started, where the pain is located, and if there are things that seem to be associated with the pain, such as food or your menstrual cycle.  Follow up with your caregiver as directed.  SEEK MEDICAL CARE IF:  Your medicine does not help your pain.  You have abnormal vaginal discharge. SEEK IMMEDIATE MEDICAL CARE IF:   You have heavy bleeding from the vagina.   Your pelvic pain increases.   You feel light-headed or faint.   You have chills.   You have pain with urination or blood in your urine.   You have uncontrolled diarrhea   or vomiting.   You have a fever or persistent symptoms for more than 3 days.  You have a fever and your symptoms suddenly get worse.   You are being physically or sexually abused.  MAKE SURE YOU:  Understand these instructions.  Will watch your condition.  Will get help if you are not doing well or get worse. Document Released:  12/09/2003 Document Revised: 05/28/2013 Document Reviewed: 05/03/2011 Exit[employer] Patient Information 2015 [employer]. This information is not intended to replace advice given to you by your health care provider. Make sure you discuss any questions you have with your health care provider.  

## 2014-08-14 LAB — GC/CHLAMYDIA PROBE AMP (~~LOC~~) NOT AT ARMC
CHLAMYDIA, DNA PROBE: NEGATIVE
Neisseria Gonorrhea: NEGATIVE

## 2014-08-15 ENCOUNTER — Emergency Department (HOSPITAL_COMMUNITY)
Admission: EM | Admit: 2014-08-15 | Discharge: 2014-08-15 | Disposition: A | Discharge status: Home / Self Care | Payer: Medicaid Other | Attending: Emergency Medicine | Admitting: Emergency Medicine

## 2014-08-15 ENCOUNTER — Encounter (HOSPITAL_COMMUNITY): Payer: Self-pay

## 2014-08-15 DIAGNOSIS — R109 Unspecified abdominal pain: Secondary | ICD-10-CM | POA: Insufficient documentation

## 2014-08-15 DIAGNOSIS — Z8719 Personal history of other diseases of the digestive system: Secondary | ICD-10-CM | POA: Insufficient documentation

## 2014-08-15 DIAGNOSIS — R102 Pelvic and perineal pain: Secondary | ICD-10-CM | POA: Insufficient documentation

## 2014-08-15 DIAGNOSIS — Z91040 Latex allergy status: Secondary | ICD-10-CM | POA: Insufficient documentation

## 2014-08-15 DIAGNOSIS — Z720 Tobacco use: Secondary | ICD-10-CM | POA: Insufficient documentation

## 2014-08-15 DIAGNOSIS — N926 Irregular menstruation, unspecified: Secondary | ICD-10-CM | POA: Insufficient documentation

## 2014-08-15 DIAGNOSIS — R319 Hematuria, unspecified: Secondary | ICD-10-CM | POA: Insufficient documentation

## 2014-08-15 DIAGNOSIS — G8929 Other chronic pain: Secondary | ICD-10-CM | POA: Diagnosis not present

## 2014-08-15 LAB — CBC WITH DIFFERENTIAL/PLATELET
BASOS ABS: 0 10*3/uL (ref 0.0–0.1)
Basophils Relative: 0 % (ref 0–1)
Eosinophils Absolute: 0.1 10*3/uL (ref 0.0–0.7)
Eosinophils Relative: 1 % (ref 0–5)
HCT: 42.3 % (ref 36.0–46.0)
Hemoglobin: 14.2 g/dL (ref 12.0–15.0)
Lymphocytes Relative: 17 % (ref 12–46)
Lymphs Abs: 2.1 10*3/uL (ref 0.7–4.0)
MCH: 32.6 pg (ref 26.0–34.0)
MCHC: 33.6 g/dL (ref 30.0–36.0)
MCV: 97.2 fL (ref 78.0–100.0)
Monocytes Absolute: 0.6 10*3/uL (ref 0.1–1.0)
Monocytes Relative: 5 % (ref 3–12)
Neutro Abs: 9.6 10*3/uL — ABNORMAL HIGH (ref 1.7–7.7)
Neutrophils Relative %: 77 % (ref 43–77)
PLATELETS: 342 10*3/uL (ref 150–400)
RBC: 4.35 MIL/uL (ref 3.87–5.11)
RDW: 12.4 % (ref 11.5–15.5)
WBC: 12.5 10*3/uL — ABNORMAL HIGH (ref 4.0–10.5)

## 2014-08-15 MED ORDER — OXYCODONE-ACETAMINOPHEN 5-325 MG PO TABS: 1.0000 | ORAL_TABLET | ORAL | Status: DC | PRN

## 2014-08-15 MED ORDER — IBUPROFEN 800 MG PO TABS
800.0000 mg | ORAL_TABLET | Freq: Once | ORAL | Status: AC
  Administered 2014-08-15: 800 mg via ORAL
  Filled 2014-08-15: qty 1

## 2014-08-15 MED ORDER — OXYCODONE-ACETAMINOPHEN 5-325 MG PO TABS
2.0000 | ORAL_TABLET | Freq: Once | ORAL | Status: AC
  Administered 2014-08-15: 2 via ORAL
  Filled 2014-08-15: qty 2

## 2014-08-15 NOTE — ED Notes (Signed)
Pt states she was here on the 19th for pelvic pain. States she was instructed to follow up with a GYN but states they will not accept her insurance. States the pain is getting worse

## 2014-08-15 NOTE — Discharge Instructions (Signed)

## 2014-08-15 NOTE — ED Provider Notes (Signed)
CSN: [id number]     Arrival date & time 08/15/14  0900 History  This chart was scribed for Margarita Grizzle, MD by Murriel Hopper, ED Scribe. This patient was seen in room APA10/[facility]10 and the patient's care was started at 9:35 AM.  Chief Complaint  Patient presents with  . Pelvic Pain      Patient is a 29 y.o. female presenting with pelvic pain. The history is provided by the patient. No language interpreter was used.  Pelvic Pain This is a recurrent problem. The current episode started more than 1 week ago. The problem occurs constantly. The problem has not changed since onset.Associated symptoms include abdominal pain. The symptoms are relieved by narcotics and NSAIDs. She has tried acetaminophen for the symptoms.     HPI Comments: Joyce Ward is a 29 y.o. female who presents to the Emergency Department complaining of constant, worsening, recurrent pelvic pain with associated hematuria that has been present for a few weeks due to endometriosis. Pt was seen here on 08/13/2014 for the same complaints. Pt was referred to an OBGYN upon her last visit and given pain medications. Pt states she cannot get her insurance to pay for an appointment, and does not get paid until Monday, so she cannot see OBGYN until then. Pt reports today requesting pain medication for her symptoms and states she cannot wait until Monday. Pt states that she has had 2 laporoscopies, has been pregnant three times, and has had 2 live births. Pt states she just moved here in March, and used to receive birth control in the form of Lupron and Depo shots. Pt states her last Depo shot was a few months ago. Pt denies fever or chills.  Labs reviewed from 2 days ago. Patient not pregnant. GC and Chlamydia negative. Past Medical History  Diagnosis Date  . Endometriosis   . GERD (gastroesophageal reflux disease)    History reviewed. No pertinent past surgical history. No family history on file. History  Substance Use Topics  .  Smoking status: Current Some Day Smoker -- 1.00 packs/day    Types: Cigarettes  . Smokeless tobacco: Not on file  . Alcohol Use: Yes     Comment: occasional (yesterday)   OB History    Gravida Para Term Preterm AB TAB SAB Ectopic Multiple Living            2     Review of Systems  Constitutional: Negative for fever.  Gastrointestinal: Positive for abdominal pain.  Genitourinary: Positive for hematuria, menstrual problem and pelvic pain.  All other systems reviewed and are negative.     Allergies  Codeine; Latex; Toradol; and Tramadol  Home Medications   Prior to Admission medications   Medication Sig Start Date End Date Taking? Authorizing Provider  azithromycin (ZITHROMAX) 250 MG tablet Take one tablet daily for 4 days starting 07/23/14 Patient not taking: Reported on 07/25/2014 07/22/14   Burgess Amor, PA-C  benzonatate (TESSALON) 100 MG capsule Take 1 capsule (100 mg total) by mouth every 8 (eight) hours. 07/25/14   Linwood Dibbles, MD  HYDROcodone-acetaminophen (NORCO/VICODIN) 5-325 MG per tablet Take 1-2 tablets by mouth every 4 (four) hours as needed for severe pain. 08/13/14   Loren Racer, MD  ibuprofen (ADVIL,MOTRIN) 600 MG tablet Take 1 tablet (600 mg total) by mouth every 6 (six) hours as needed. 08/13/14   Loren Racer, MD   BP 115/83 mmHg  Pulse 98  Temp(Src) 98.2 F (36.8 C) (Oral)  Resp 18  Ht  (  1.6 m)  Wt 118 lb (53.524 kg)  BMI 20.91 kg/m2  SpO2 100%  LMP 08/13/2014 Physical Exam  Constitutional: She is oriented to person, place, and time. She appears well-developed and well-nourished.  HENT:  Head: Normocephalic and atraumatic.  Right Ear: External ear normal.  Left Ear: External ear normal.  Nose: Nose normal.  Mouth/Throat: Oropharynx is clear and moist.  Eyes: Conjunctivae and EOM are normal. Pupils are equal, round, and reactive to light.  Neck: Normal range of motion. Neck supple.  Cardiovascular: Normal rate, regular rhythm, normal heart  sounds and intact distal pulses.   Pulmonary/Chest: Effort normal and breath sounds normal.  Abdominal: Soft. Bowel sounds are normal.  Genitourinary: Vagina normal and uterus normal.  Patient cries with pain with speculum insertion and bimanual exam. However no visual abnormality or palpable abnormality is noted. There is no bleeding or significant discharge.  Musculoskeletal: Normal range of motion.  Neurological: She is alert and oriented to person, place, and time. She has normal reflexes.  Skin: Skin is warm and dry.  Psychiatric: She has a normal mood and affect. Her behavior is normal. Judgment and thought content normal.  Nursing note and vitals reviewed.   ED Course  Procedures (including critical care time)  DIAGNOSTIC STUDIES: Oxygen Saturation is 100% on room air, normal by my interpretation.    COORDINATION OF CARE: 9:45 AM Discussed treatment plan with pt at bedside and pt agreed to plan.   Labs Review Labs Reviewed - No data to display  Imaging Review No results found.   EKG Interpretation None      MDM   Final diagnoses:  Chronic pelvic pain in female   patient seen here 2 days ago and had thorough evaluation for her pelvic pain at that time. Should normal GC and chlamydia. Today I did repeat her pelvic exam but did not resend wet prep or cultures. I did note that she had an elevated white blood cell count 2 days ago and had it rechecked today and has come down. Patient is given a prescription for 10 Percocet. She is also given discharge instructions regarding chronic pain. She is advised that she will need to follow-up with OB/GYN for further treatment.I personally performed the services described in this documentation, which was scribed in my presence. The recorded information has been reviewed and considered.   Margarita Grizzle, MD 08/15/14 1106

## 2014-08-16 ENCOUNTER — Emergency Department (HOSPITAL_COMMUNITY)
Admission: EM | Admit: 2014-08-16 | Discharge: 2014-08-16 | Disposition: A | Discharge status: Home / Self Care | Payer: Medicaid Other | Attending: Emergency Medicine | Admitting: Emergency Medicine

## 2014-08-16 ENCOUNTER — Encounter (HOSPITAL_COMMUNITY): Payer: Self-pay | Admitting: *Deleted

## 2014-08-16 DIAGNOSIS — Z91040 Latex allergy status: Secondary | ICD-10-CM | POA: Insufficient documentation

## 2014-08-16 DIAGNOSIS — G8929 Other chronic pain: Secondary | ICD-10-CM | POA: Diagnosis not present

## 2014-08-16 DIAGNOSIS — Z760 Encounter for issue of repeat prescription: Secondary | ICD-10-CM | POA: Diagnosis present

## 2014-08-16 DIAGNOSIS — N809 Endometriosis, unspecified: Secondary | ICD-10-CM | POA: Insufficient documentation

## 2014-08-16 DIAGNOSIS — Z720 Tobacco use: Secondary | ICD-10-CM | POA: Diagnosis not present

## 2014-08-16 DIAGNOSIS — R102 Pelvic and perineal pain: Secondary | ICD-10-CM

## 2014-08-16 DIAGNOSIS — Z8719 Personal history of other diseases of the digestive system: Secondary | ICD-10-CM | POA: Insufficient documentation

## 2014-08-16 MED ORDER — OXYCODONE-ACETAMINOPHEN 5-325 MG PO TABS
2.0000 | ORAL_TABLET | Freq: Once | ORAL | Status: AC
  Administered 2014-08-16: 2 via ORAL
  Filled 2014-08-16: qty 2

## 2014-08-16 MED ORDER — IBUPROFEN 400 MG PO TABS
600.0000 mg | ORAL_TABLET | Freq: Once | ORAL | Status: AC
  Administered 2014-08-16: 600 mg via ORAL
  Filled 2014-08-16: qty 2

## 2014-08-16 NOTE — Discharge Instructions (Signed)
Pelvic Pain Female pelvic pain can be caused by many different things and start from a variety of places. Pelvic pain refers to pain that is located in the lower half of the abdomen and between your hips. The pain may occur over a short period of time (acute) or may be reoccurring (chronic). The cause of pelvic pain may be related to disorders affecting the female reproductive organs (gynecologic), but it may also be related to the bladder, kidney stones, an intestinal complication, or muscle or skeletal problems. Getting help right away for pelvic pain is important, especially if there has been severe, sharp, or a sudden onset of unusual pain. It is also important to get help right away because some types of pelvic pain can be life threatening.  CAUSES  Below are only some of the causes of pelvic pain. The causes of pelvic pain can be in one of several categories.   Gynecologic.  Pelvic inflammatory disease.  Sexually transmitted infection.  Ovarian cyst or a twisted ovarian ligament (ovarian torsion).  Uterine lining that grows outside the uterus (endometriosis).  Fibroids, cysts, or tumors.  Ovulation.  Pregnancy.  Pregnancy that occurs outside the uterus (ectopic pregnancy).  Miscarriage.  Labor.  Abruption of the placenta or ruptured uterus.  Infection.  Uterine infection (endometritis).  Bladder infection.  Diverticulitis.  Miscarriage related to a uterine infection (septic abortion).  Bladder.  Inflammation of the bladder (cystitis).  Kidney stone(s).  Gastrointestinal.  Constipation.  Diverticulitis.  Neurologic.  Trauma.  Feeling pelvic pain because of mental or emotional causes (psychosomatic).  Cancers of the bowel or pelvis. EVALUATION  Your caregiver will want to take a careful history of your concerns. This includes recent changes in your health, a careful gynecologic history of your periods (menses), and a sexual history. Obtaining your family  history and medical history is also important. Your caregiver may suggest a pelvic exam. A pelvic exam will help identify the location and severity of the pain. It also helps in the evaluation of which organ system may be involved. In order to identify the cause of the pelvic pain and be properly treated, your caregiver may order tests. These tests may include:   A pregnancy test.  Pelvic ultrasonography.  An X-ray exam of the abdomen.  A urinalysis or evaluation of vaginal discharge.  Blood tests. HOME CARE INSTRUCTIONS   Only take over-the-counter or prescription medicines for pain, discomfort, or fever as directed by your caregiver.   Rest as directed by your caregiver.   Eat a balanced diet.   Drink enough fluids to make your urine clear or pale yellow, or as directed.   Avoid sexual intercourse if it causes pain.   Apply warm or cold compresses to the lower abdomen depending on which one helps the pain.   Avoid stressful situations.   Keep a journal of your pelvic pain. Write down when it started, where the pain is located, and if there are things that seem to be associated with the pain, such as food or your menstrual cycle.  Follow up with your caregiver as directed.  SEEK MEDICAL CARE IF:  Your medicine does not help your pain.  You have abnormal vaginal discharge. SEEK IMMEDIATE MEDICAL CARE IF:   You have heavy bleeding from the vagina.   Your pelvic pain increases.   You feel light-headed or faint.   You have chills.   You have pain with urination or blood in your urine.   You have uncontrolled diarrhea   or vomiting.   You have a fever or persistent symptoms for more than 3 days.  You have a fever and your symptoms suddenly get worse.   You are being physically or sexually abused.  MAKE SURE YOU:  Understand these instructions.  Will watch your condition.  Will get help if you are not doing well or get worse. Document Released:  12/09/2003 Document Revised: 05/28/2013 Document Reviewed: 05/03/2011 Exit[employer] Patient Information 2015 [employer]. This information is not intended to replace advice given to you by your health care provider. Make sure you discuss any questions you have with your health care provider.  

## 2014-08-16 NOTE — ED Provider Notes (Signed)
CSN: [id number]     Arrival date & time 08/16/14  1113 History   First MD Initiated Contact with Patient 08/16/14 1133     Chief Complaint  Patient presents with  . Medication Refill     (Consider location/radiation/quality/duration/timing/severity/associated sxs/prior Treatment) HPI   PCP: No PCP Per Patient Last menstrual period 08/13/2014.  Joyce Ward is a 29 y.o.female with a significant PMH of endometriosis, GERD presents to the ER with complaints of request for narcotic medication refill. Patient see's an OB/Gyn for the first time on Monday for her endometriosis. She was seen on July 9, 19, and yesterday on the 22nd for pain and had a pelvic exam done on each v isit which were all normal. She was given 10 Percocet yesterday and reports show only has 4 left and is concerned with them not lasting her through the weekend. No new symptoms but she needs more Percocet to get her till her appt on Monday.    The patient denies diaphoresis, fever, headache, weakness (general or focal), confusion, change of vision,  neck pain, dysphagia, aphagia, chest pain, shortness of breath,  back pain, abdominal pains, nausea, vomiting, diarrhea, lower extremity swelling, rash.    Past Medical History  Diagnosis Date  . Endometriosis   . GERD (gastroesophageal reflux disease)    History reviewed. No pertinent past surgical history. No family history on file. History  Substance Use Topics  . Smoking status: Current Some Day Smoker -- 1.00 packs/day    Types: Cigarettes  . Smokeless tobacco: Not on file  . Alcohol Use: Yes     Comment: occasional (yesterday)   OB History    Gravida Para Term Preterm AB TAB SAB Ectopic Multiple Living            2     Review of Systems  10 Systems reviewed and are negative for acute change except as noted in the HPI.   Allergies  Codeine; Latex; Naproxen; Toradol; and Tramadol  Home Medications   Prior to Admission medications   Medication Sig  Start Date End Date Taking? Authorizing Provider  ibuprofen (ADVIL,MOTRIN) 600 MG tablet Take 1 tablet (600 mg total) by mouth every 6 (six) hours as needed for mild pain. 08/17/14   Kristen N Ward, DO  ondansetron (ZOFRAN ODT) 4 MG disintegrating tablet Take 1 tablet (4 mg total) by mouth every 8 (eight) hours as needed for nausea or vomiting. 08/17/14   Kristen N Ward, DO  oxyCODONE-acetaminophen (PERCOCET/ROXICET) 5-325 MG per tablet Take 2 tablets by mouth every 6 (six) hours as needed for severe pain. 08/17/14   Kristen N Ward, DO  oxyCODONE-acetaminophen (PERCOCET/ROXICET) 5-325 MG per tablet Take 2 tablets by mouth every 6 (six) hours as needed for severe pain. 08/17/14   Kristen N Ward, DO   BP 124/82 mmHg  Pulse 79  Temp(Src) 99.2 F (37.3 C) (Oral)  Resp 16  SpO2 100%  LMP 08/13/2014 Physical Exam  Constitutional: She appears well-developed and well-nourished. No distress.  HENT:  Head: Normocephalic and atraumatic.  Eyes: Pupils are equal, round, and reactive to light.  Neck: Normal range of motion. Neck supple.  Cardiovascular: Normal rate and regular rhythm.   Pulmonary/Chest: Effort normal.  Abdominal: Soft.  Genitourinary:  Pt declines pelvic exam because she reports having one done yesterday  Neurological: She is alert.  Skin: Skin is warm and dry.  Nursing note and vitals reviewed.   ED Course  Procedures (including critical care time) Labs Review Labs  Reviewed - No data to display  Imaging Review No results found.   EKG Interpretation None      MDM   Final diagnoses:  Chronic pain  Endometriosis  Pelvic pain in female    Patient told that she will not be getting narcotic refill from me at todays visit since she still has some left from yesterday. Will give a dose here int he ED.  Pt cooperative. F/u appt on Monday.  Medications  oxyCODONE-acetaminophen (PERCOCET/ROXICET) 5-325 MG per tablet 2 tablet (2 tablets Oral Given 08/16/14 1148)  ibuprofen  (ADVIL,MOTRIN) tablet 600 mg (600 mg Oral Given 08/16/14 1148)    29 y.o.Joyce Ward's evaluation in the Emergency Department is complete. It has been determined that no acute conditions requiring further emergency intervention are present at this time. The patient/guardian have been advised of the diagnosis and plan. We have discussed signs and symptoms that warrant return to the ED, such as changes or worsening in symptoms.  Vital signs are stable at discharge. Filed Vitals:   08/16/14 1116  BP: 124/82  Pulse: 79  Temp: 99.2 F (37.3 C)  Resp: 16    Patient/guardian has voiced understanding and agreed to follow-up with the PCP or specialist.     Marlon Pel, PA-C 08/17/14 1532  Gilda Crease, MD 08/27/14 [phone]

## 2014-08-16 NOTE — ED Notes (Signed)
Pt seen here yesterday. States she was only prescribed 10 percocet. Has an appt on  Monday @ 10:30 with Dr. Emelda Fear and states she don't have enough medication to last her over the weekend.

## 2014-08-17 ENCOUNTER — Encounter (HOSPITAL_COMMUNITY): Payer: Self-pay

## 2014-08-17 ENCOUNTER — Emergency Department (HOSPITAL_COMMUNITY)
Admission: EM | Admit: 2014-08-17 | Discharge: 2014-08-17 | Disposition: A | Discharge status: Home / Self Care | Payer: Medicaid Other | Attending: Emergency Medicine | Admitting: Emergency Medicine

## 2014-08-17 DIAGNOSIS — G8929 Other chronic pain: Secondary | ICD-10-CM | POA: Insufficient documentation

## 2014-08-17 DIAGNOSIS — Z720 Tobacco use: Secondary | ICD-10-CM | POA: Insufficient documentation

## 2014-08-17 DIAGNOSIS — R102 Pelvic and perineal pain: Secondary | ICD-10-CM | POA: Diagnosis present

## 2014-08-17 DIAGNOSIS — N809 Endometriosis, unspecified: Secondary | ICD-10-CM | POA: Diagnosis not present

## 2014-08-17 DIAGNOSIS — Z91040 Latex allergy status: Secondary | ICD-10-CM | POA: Diagnosis not present

## 2014-08-17 DIAGNOSIS — Z8719 Personal history of other diseases of the digestive system: Secondary | ICD-10-CM | POA: Diagnosis not present

## 2014-08-17 MED ORDER — IBUPROFEN 400 MG PO TABS
600.0000 mg | ORAL_TABLET | Freq: Once | ORAL | Status: AC
  Administered 2014-08-17: 600 mg via ORAL
  Filled 2014-08-17: qty 2

## 2014-08-17 MED ORDER — OXYCODONE-ACETAMINOPHEN 5-325 MG PO TABS: 2.0000 | ORAL_TABLET | Freq: Four times a day (QID) | ORAL | Status: DC | PRN

## 2014-08-17 MED ORDER — ONDANSETRON 4 MG PO TBDP: 4.0000 mg | ORAL_TABLET | Freq: Three times a day (TID) | ORAL | Status: DC | PRN

## 2014-08-17 MED ORDER — IBUPROFEN 600 MG PO TABS: 600.0000 mg | ORAL_TABLET | Freq: Four times a day (QID) | ORAL | Status: DC | PRN

## 2014-08-17 NOTE — Discharge Instructions (Signed)
Pelvic Pain °Female pelvic pain can be caused by many different things and start from a variety of places. Pelvic pain refers to pain that is located in the lower half of the abdomen and between your hips. The pain may occur over a short period of time (acute) or may be reoccurring (chronic). The cause of pelvic pain may be related to disorders affecting the female reproductive organs (gynecologic), but it may also be related to the bladder, kidney stones, an intestinal complication, or muscle or skeletal problems. Getting help right away for pelvic pain is important, especially if there has been severe, sharp, or a sudden onset of unusual pain. It is also important to get help right away because some types of pelvic pain can be life threatening.  °CAUSES  °Below are only some of the causes of pelvic pain. The causes of pelvic pain can be in one of several categories.  °· Gynecologic. °· Pelvic inflammatory disease. °· Sexually transmitted infection. °· Ovarian cyst or a twisted ovarian ligament (ovarian torsion). °· Uterine lining that grows outside the uterus (endometriosis). °· Fibroids, cysts, or tumors. °· Ovulation. °· Pregnancy. °· Pregnancy that occurs outside the uterus (ectopic pregnancy). °· Miscarriage. °· Labor. °· Abruption of the placenta or ruptured uterus. °· Infection. °· Uterine infection (endometritis). °· Bladder infection. °· Diverticulitis. °· Miscarriage related to a uterine infection (septic abortion). °· Bladder. °· Inflammation of the bladder (cystitis). °· Kidney stone(s). °· Gastrointestinal. °· Constipation. °· Diverticulitis. °· Neurologic. °· Trauma. °· Feeling pelvic pain because of mental or emotional causes (psychosomatic). °· Cancers of the bowel or pelvis. °EVALUATION  °Your caregiver will want to take a careful history of your concerns. This includes recent changes in your health, a careful gynecologic history of your periods (menses), and a sexual history. Obtaining your family  history and medical history is also important. Your caregiver may suggest a pelvic exam. A pelvic exam will help identify the location and severity of the pain. It also helps in the evaluation of which organ system may be involved. In order to identify the cause of the pelvic pain and be properly treated, your caregiver may order tests. These tests may include:  °· A pregnancy test. °· Pelvic ultrasonography. °· An X-ray exam of the abdomen. °· A urinalysis or evaluation of vaginal discharge. °· Blood tests. °HOME CARE INSTRUCTIONS  °· Only take over-the-counter or prescription medicines for pain, discomfort, or fever as directed by your caregiver.   °· Rest as directed by your caregiver.   °· Eat a balanced diet.   °· Drink enough fluids to make your urine clear or pale yellow, or as directed.   °· Avoid sexual intercourse if it causes pain.   °· Apply warm or cold compresses to the lower abdomen depending on which one helps the pain.   °· Avoid stressful situations.   °· Keep a journal of your pelvic pain. Write down when it started, where the pain is located, and if there are things that seem to be associated with the pain, such as food or your menstrual cycle. °· Follow up with your caregiver as directed.   °SEEK MEDICAL CARE IF: °· Your medicine does not help your pain. °· You have abnormal vaginal discharge. °SEEK IMMEDIATE MEDICAL CARE IF:  °· You have heavy bleeding from the vagina.   °· Your pelvic pain increases.   °· You feel light-headed or faint.   °· You have chills.   °· You have pain with urination or blood in your urine.   °· You have uncontrolled diarrhea   or vomiting.   You have a fever or persistent symptoms for more than 3 days.  You have a fever and your symptoms suddenly get worse.   You are being physically or sexually abused.  MAKE SURE YOU:  Understand these instructions.  Will watch your condition.  Will get help if you are not doing well or get worse. Document Released:  12/09/2003 Document Revised: 05/28/2013 Document Reviewed: 05/03/2011 [employer] Patient Information 2015 [employer]. This information is not intended to replace advice given to you by your health care provider. Make sure you discuss any questions you have with your health care provider. Endometriosis Endometriosis is a condition in which the tissue that lines the uterus (endometrium) grows outside of its normal location. The tissue may grow in many locations close to the uterus, but it commonly grows on the ovaries, fallopian tubes, vagina, or bowel. Because the uterus expels, or sheds, its lining every menstrual cycle, there is bleeding wherever the endometrial tissue is located. This can cause pain because blood is irritating to tissues not normally exposed to it.  CAUSES  The cause of endometriosis is not known.  SIGNS AND SYMPTOMS  Often, there are no symptoms. When symptoms are present, they can vary with the location of the displaced tissue. Various symptoms can occur at different times. Although symptoms occur mainly during a woman's menstrual period, they can also occur midcycle and usually stop with menopause. Some people may go months with no symptoms at all. Symptoms may include:   Back or abdominal pain.   Heavier bleeding during periods.   Pain during intercourse.   Painful bowel movements.   Infertility. DIAGNOSIS  Your health care provider will do a physical exam and ask about your symptoms. Various tests may be done, such as:   Blood tests and urine tests. These are done to help rule out other problems.   Ultrasound. This test is done to look for abnormal tissue.   An X-ray of the lower bowel (barium enema).  Laparoscopy. In this procedure, a thin, lighted tube with a tiny camera on the end (laparoscope) is inserted into your abdomen. This helps your health care provider look for abnormal tissue to confirm the diagnosis. The health care provider may also remove a  small piece of tissue (biopsy) from any abnormal tissue found. This tissue sample can then be sent to a lab so it can be looked at under a microscope. TREATMENT  Treatment will vary and may include:   Medicines to relieve pain. Nonsteroidal anti-inflammatory drugs (NSAIDs) are a type of pain medicine that can help to relieve the pain caused by endometriosis.  Hormonal therapy. When using hormonal therapy, periods are eliminated. This eliminates the monthly exposure to blood by the displaced endometrial tissue.   Surgery. Surgery may sometimes be done to remove the abnormal endometrial tissue. In severe cases, surgery may be done to remove the fallopian tubes, uterus, and ovaries (hysterectomy). HOME CARE INSTRUCTIONS   Take all medicines as directed by your health care provider. Do not take aspirin because it may increase bleeding when you are not on hormonal therapy.   Avoid activities that produce pain, including sexual activity. SEEK MEDICAL CARE IF:  You have pelvic pain before, after, or during your periods.  You have pelvic pain between periods that gets worse during your period.  You have pelvic pain during or after sex.  You have pelvic pain with bowel movements or urination, especially during your period.  You have problems   getting pregnant.  You have a fever. SEEK IMMEDIATE MEDICAL CARE IF:   Your pain is severe and is not responding to pain medicine.   You have severe nausea and vomiting, or you cannot keep foods down.   You have pain that is limited to the right lower part of your abdomen.   You have swelling or increasing pain in your abdomen.   You see blood in your stool.  MAKE SURE YOU:   Understand these instructions.  Will watch your condition.  Will get help right away if you are not doing well or get worse. Document Released: 01/09/2000 Document Revised: 05/28/2013 Document Reviewed: 09/08/2012 [employer] Patient Information 2015 [employer]. This information is not intended to replace advice given to you by your health care provider. Make sure you discuss any questions you have with your health care provider.  

## 2014-08-17 NOTE — ED Provider Notes (Signed)
TIME SEEN: 1:15 AM  CHIEF COMPLAINT: Pelvic pain  HPI: Pt is a 29 y.o. female with history of endometriosis causing chronic pelvic pain that was diagnosed with laparoscopic surgery who presents to the emergency department with diffuse lower abdominal pain that feels similar to her endometriosis. States that her pain has been bad since April. She previously had a Lupron injection that controlled her pain but has not had follow-up with OB/GYN so could not get another injection. States she does have an appointment with Dr. Emelda Fear with OB/GYN in 2 days. States that this is her typical chronic pain. Denies any new pain. Denies fevers, chills, nausea, vomiting, diarrhea, dysuria or hematuria, vaginal bleeding or discharge. Her last menstrual period was July 19. She's been in the emergency room several times for similar symptoms and is recently had labs which showed mild leukocytosis which appears to have frequently but otherwise urinalysis showed no sign of infection, she was not pregnant, gonorrhea chlamydia negative. She did have few clue cells on her wet prep but otherwise this was unremarkable as well. She was given 10 Percocet tablets on July 19 but states she is out of this medication as she is having to take 2 tablets every 5 hours and she is requesting something more for pain to make it through the weekend until she sees her OB/GYN.  ROS: See HPI Constitutional: no fever  Eyes: no drainage  ENT: no runny nose   Cardiovascular:  no chest pain  Resp: no SOB  GI: no vomiting GU: no dysuria Integumentary: no rash  Allergy: no hives  Musculoskeletal: no leg swelling  Neurological: no slurred speech ROS otherwise negative  PAST MEDICAL HISTORY/PAST SURGICAL HISTORY:  Past Medical History  Diagnosis Date  . Endometriosis   . GERD (gastroesophageal reflux disease)     MEDICATIONS:  Prior to Admission medications   Medication Sig Start Date End Date Taking? Authorizing Provider  ibuprofen  (ADVIL,MOTRIN) 600 MG tablet Take 1 tablet (600 mg total) by mouth every 6 (six) hours as needed. Patient taking differently: Take 600 mg by mouth every 6 (six) hours as needed for moderate pain.  08/13/14  Yes Loren Racer, MD  oxyCODONE-acetaminophen (PERCOCET/ROXICET) 5-325 MG per tablet Take 1 tablet by mouth every 4 (four) hours as needed for severe pain. 08/15/14  Yes Margarita Grizzle, MD  azithromycin (ZITHROMAX) 250 MG tablet Take one tablet daily for 4 days starting 07/23/14 Patient not taking: Reported on 07/25/2014 07/22/14   Burgess Amor, PA-C  benzonatate (TESSALON) 100 MG capsule Take 1 capsule (100 mg total) by mouth every 8 (eight) hours. Patient not taking: Reported on 08/15/2014 07/25/14   Linwood Dibbles, MD  HYDROcodone-acetaminophen (NORCO/VICODIN) 5-325 MG per tablet Take 1-2 tablets by mouth every 4 (four) hours as needed for severe pain. Patient not taking: Reported on 08/16/2014 08/13/14   Loren Racer, MD    ALLERGIES:  Allergies  Allergen Reactions  . Codeine Hives and Swelling  . Latex Hives  . Naproxen Hives  . Toradol [Ketorolac Tromethamine] Hives  . Tramadol Hives    SOCIAL HISTORY:  History  Substance Use Topics  . Smoking status: Current Some Day Smoker -- 1.00 packs/day    Types: Cigarettes  . Smokeless tobacco: Not on file  . Alcohol Use: Yes     Comment: occasional (yesterday)    FAMILY HISTORY: No family history on file.  EXAM: BP 106/74 mmHg  Pulse 76  Temp(Src) 98.2 F (36.8 C) (Oral)  Resp 16  Ht 5'  3" (1.6 m)  Wt 119 lb (53.978 kg)  BMI 21.09 kg/m2  SpO2 100%  LMP 08/13/2014 CONSTITUTIONAL: Alert and oriented and responds appropriately to questions. Well-appearing; well-nourished, tearful, nontoxic, afebrile HEAD: Normocephalic EYES: Conjunctivae clear, PERRL ENT: normal nose; no rhinorrhea; moist mucous membranes; pharynx without lesions noted NECK: Supple, no meningismus, no LAD  CARD: RRR; S1 and S2 appreciated; no murmurs, no clicks, no  rubs, no gallops RESP: Normal chest excursion without splinting or tachypnea; breath sounds clear and equal bilaterally; no wheezes, no rhonchi, no rales, no hypoxia or respiratory distress, speaking full sentences ABD/GI: Normal bowel sounds; non-distended; soft, tender throughout the lower abdomen, no rebound, no guarding, no peritoneal signs, no tenderness at McBurney's point BACK:  The back appears normal and is non-tender to palpation, there is no CVA tenderness EXT: Normal ROM in all joints; non-tender to palpation; no edema; normal capillary refill; no cyanosis, no calf tenderness or swelling    SKIN: Normal color for age and race; warm NEURO: Moves all extremities equally, sensation to light touch intact diffusely, cranial nerves II through XII intact PSYCH: The patient's mood and manner are appropriate. Grooming and personal hygiene are appropriate.  MEDICAL DECISION MAKING: Patient here with chronic pelvic pain secondary to her endometriosis. She is requesting more pain medication to make it the next 2 days until she sees her OB/GYN. Have discussed with patient at length that I am concerned about her frequent visits to the emergency department and she states she is aware of this, embarrassed by this. Discussed with patient I will give her enough Percocet to last her for the next 2 days (for 2 Percocet tablets every 6 hours) but then after that she will not be receiving any further narcotics from the emergency department. She states she has follow-up with Dr. Emelda Fear and understands this plan. I do not feel she needs any further emergent workup today. I discussed with her return precautions. She verbalized understanding and is comfortable with plan.       Layla Maw Korvin Valentine, DO 08/17/14 [phone]

## 2014-08-17 NOTE — ED Notes (Signed)
Pt states she has an appointment with Dr Emelda Fear on Monday, is having uncontrolled pain.  This is the 4th visit for this complaint

## 2014-08-19 ENCOUNTER — Encounter: Payer: Self-pay | Admitting: Adult Health

## 2014-08-19 ENCOUNTER — Ambulatory Visit (INDEPENDENT_AMBULATORY_CARE_PROVIDER_SITE_OTHER): Payer: Medicaid Other | Admitting: Adult Health

## 2014-08-19 VITALS — BP 90/68 | HR 91 | Ht 63.0 in | Wt 113.0 lb

## 2014-08-19 DIAGNOSIS — N921 Excessive and frequent menstruation with irregular cycle: Secondary | ICD-10-CM | POA: Insufficient documentation

## 2014-08-19 DIAGNOSIS — R102 Pelvic and perineal pain: Secondary | ICD-10-CM | POA: Diagnosis not present

## 2014-08-19 DIAGNOSIS — R319 Hematuria, unspecified: Secondary | ICD-10-CM

## 2014-08-19 DIAGNOSIS — Z8742 Personal history of other diseases of the female genital tract: Secondary | ICD-10-CM

## 2014-08-19 DIAGNOSIS — G8929 Other chronic pain: Secondary | ICD-10-CM | POA: Insufficient documentation

## 2014-08-19 DIAGNOSIS — Z3202 Encounter for pregnancy test, result negative: Secondary | ICD-10-CM

## 2014-08-19 HISTORY — DX: Pelvic and perineal pain: R10.2

## 2014-08-19 HISTORY — DX: Excessive and frequent menstruation with irregular cycle: N92.1

## 2014-08-19 HISTORY — DX: Personal history of other diseases of the female genital tract: Z87.42

## 2014-08-19 LAB — POCT URINALYSIS DIPSTICK
Glucose, UA: NEGATIVE
Leukocytes, UA: NEGATIVE
NITRITE UA: NEGATIVE
PROTEIN UA: NEGATIVE

## 2014-08-19 LAB — POCT URINE PREGNANCY: Preg Test, Ur: NEGATIVE

## 2014-08-19 MED ORDER — HYDROCODONE-ACETAMINOPHEN 5-325 MG PO TABS: 1.0000 | ORAL_TABLET | Freq: Four times a day (QID) | ORAL | Status: DC | PRN

## 2014-08-19 MED ORDER — NORETHIN ACE-ETH ESTRAD-FE 1-20 MG-MCG PO TABS: ORAL_TABLET | ORAL | Status: DC

## 2014-08-19 NOTE — Patient Instructions (Signed)
Pelvic Pain Female pelvic pain can be caused by many different things and start from a variety of places. Pelvic pain refers to pain that is located in the lower half of the abdomen and between your hips. The pain may occur over a short period of time (acute) or may be reoccurring (chronic). The cause of pelvic pain may be related to disorders affecting the female reproductive organs (gynecologic), but it may also be related to the bladder, kidney stones, an intestinal complication, or muscle or skeletal problems. Getting help right away for pelvic pain is important, especially if there has been severe, sharp, or a sudden onset of unusual pain. It is also important to get help right away because some types of pelvic pain can be life threatening.  CAUSES  Below are only some of the causes of pelvic pain. The causes of pelvic pain can be in one of several categories.   Gynecologic.  Pelvic inflammatory disease.  Sexually transmitted infection.  Ovarian cyst or a twisted ovarian ligament (ovarian torsion).  Uterine lining that grows outside the uterus (endometriosis).  Fibroids, cysts, or tumors.  Ovulation.  Pregnancy.  Pregnancy that occurs outside the uterus (ectopic pregnancy).  Miscarriage.  Labor.  Abruption of the placenta or ruptured uterus.  Infection.  Uterine infection (endometritis).  Bladder infection.  Diverticulitis.  Miscarriage related to a uterine infection (septic abortion).  Bladder.  Inflammation of the bladder (cystitis).  Kidney stone(s).  Gastrointestinal.  Constipation.  Diverticulitis.  Neurologic.  Trauma.  Feeling pelvic pain because of mental or emotional causes (psychosomatic).  Cancers of the bowel or pelvis. EVALUATION  Your caregiver will want to take a careful history of your concerns. This includes recent changes in your health, a careful gynecologic history of your periods (menses), and a sexual history. Obtaining your family  history and medical history is also important. Your caregiver may suggest a pelvic exam. A pelvic exam will help identify the location and severity of the pain. It also helps in the evaluation of which organ system may be involved. In order to identify the cause of the pelvic pain and be properly treated, your caregiver may order tests. These tests may include:   A pregnancy test.  Pelvic ultrasonography.  An X-ray exam of the abdomen.  A urinalysis or evaluation of vaginal discharge.  Blood tests. HOME CARE INSTRUCTIONS   Only take over-the-counter or prescription medicines for pain, discomfort, or fever as directed by your caregiver.   Rest as directed by your caregiver.   Eat a balanced diet.   Drink enough fluids to make your urine clear or pale yellow, or as directed.   Avoid sexual intercourse if it causes pain.   Apply warm or cold compresses to the lower abdomen depending on which one helps the pain.   Avoid stressful situations.   Keep a journal of your pelvic pain. Write down when it started, where the pain is located, and if there are things that seem to be associated with the pain, such as food or your menstrual cycle.  Follow up with your caregiver as directed.  SEEK MEDICAL CARE IF:  Your medicine does not help your pain.  You have abnormal vaginal discharge. SEEK IMMEDIATE MEDICAL CARE IF:   You have heavy bleeding from the vagina.   Your pelvic pain increases.   You feel light-headed or faint.   You have chills.   You have pain with urination or blood in your urine.   You have uncontrolled diarrhea   or vomiting.   You have a fever or persistent symptoms for more than 3 days.  You have a fever and your symptoms suddenly get worse.   You are being physically or sexually abused.  MAKE SURE YOU:  Understand these instructions.  Will watch your condition.  Will get help if you are not doing well or get worse. Document Released:  12/09/2003 Document Revised: 05/28/2013 Document Reviewed: 05/03/2011 [facility] Patient Information 2015 [place], [place]. This information is not intended to replace advice given to you by your health care provider. Make sure you discuss any questions you have with your health care provider. Start birth control today and take daily continually  Return in 4 weeks for pap and physical  Take pain meds only if needed, will not refill

## 2014-08-19 NOTE — Progress Notes (Signed)
Subjective:     Patient ID: Joyce Ward, female   DOB: 1985/06/10, 29 y.o.   MRN: [id number]  HPI [name] is a 29 year old white female in for ER follow up.She has history of endometriosis diagnosed by laparascopic surgery by a Dr Orvan Falconer.She has chronic pelvic pain and has had numerous ER visits and she also complains of BTB and periods are heavy at times, she is not on any birth control but has used Lupron in past and it seems to help.She is G3P2A1 and desires 1 more child.  Review of Systems Patient denies any headaches, hearing loss, fatigue, blurred vision, shortness of breath, chest pain,roblems with bowel movements, urination, or intercourse. No joint pain or mood swings.See HPI for positives.    Objective:   Physical Exam BP 90/68 mmHg  Pulse 91  Ht  (1.6 m)  Wt 113 lb (51.256 kg)  BMI 20.02 kg/m2  LMP 07/19/2016UPT negative, urine dipstick 2+blood, Skin warm and dry.Pelvic: external genitalia is normal in appearance no lesions, vagina: white discharge,urethra has no lesions or masses noted, cervix:smooth and bulbous, uterus: normal size, shape and contour, mildly tender, no masses felt,adnexa has no masses, but mildly tender, esp to the right, bladder is not tender and has not masses felt.She had negative GC/CHL 7/19 in ER and negative CT.She asked for more pain meds, told her would give #30 norco but no more, and will prescribe OCs to take all the time to stop her periods, and she voices understanding.    Assessment:     Pelvic pain,chronic History of endometriosis BTB Hematuria     Plan:    Review handout on pelvic pain  UA C&S sent Rx junel 1-20 take 1 daily continuously with 11 refills,start today  Use condoms Rx norco 5-325 mg #30 take 1 every 6 hours prn pain no refills Follow up in 4 weeks for pap and physical

## 2014-08-20 LAB — URINALYSIS, ROUTINE W REFLEX MICROSCOPIC
Bilirubin, UA: NEGATIVE
Glucose, UA: NEGATIVE
Ketones, UA: NEGATIVE
Leukocytes, UA: NEGATIVE
NITRITE UA: NEGATIVE
PH UA: 8 — AB (ref 5.0–7.5)
Protein, UA: NEGATIVE
RBC, UA: NEGATIVE
Specific Gravity, UA: 1.012 (ref 1.005–1.030)
Urobilinogen, Ur: 0.2 mg/dL (ref 0.2–1.0)

## 2014-08-20 LAB — URINE CULTURE

## 2014-08-22 ENCOUNTER — Telehealth: Payer: Self-pay | Admitting: *Deleted

## 2014-08-22 MED ORDER — HYDROCODONE-ACETAMINOPHEN 5-325 MG PO TABS: 1.0000 | ORAL_TABLET | Freq: Four times a day (QID) | ORAL | Status: DC | PRN
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
## 2014-08-28 ENCOUNTER — Emergency Department (HOSPITAL_COMMUNITY)
Admission: EM | Admit: 2014-08-28 | Discharge: 2014-08-28 | Disposition: A | Discharge status: Home / Self Care | Payer: Medicaid Other | Attending: Emergency Medicine | Admitting: Emergency Medicine

## 2014-08-28 ENCOUNTER — Encounter (HOSPITAL_COMMUNITY): Payer: Self-pay | Admitting: Emergency Medicine

## 2014-08-28 DIAGNOSIS — Z8742 Personal history of other diseases of the female genital tract: Secondary | ICD-10-CM | POA: Insufficient documentation

## 2014-08-28 DIAGNOSIS — G8929 Other chronic pain: Secondary | ICD-10-CM | POA: Diagnosis not present

## 2014-08-28 DIAGNOSIS — Z720 Tobacco use: Secondary | ICD-10-CM | POA: Diagnosis not present

## 2014-08-28 DIAGNOSIS — Z8719 Personal history of other diseases of the digestive system: Secondary | ICD-10-CM | POA: Insufficient documentation

## 2014-08-28 DIAGNOSIS — R102 Pelvic and perineal pain: Secondary | ICD-10-CM | POA: Insufficient documentation

## 2014-08-28 DIAGNOSIS — F419 Anxiety disorder, unspecified: Secondary | ICD-10-CM | POA: Insufficient documentation

## 2014-08-28 NOTE — ED Notes (Signed)
Pt c/o severe lower abd pain with vaginal bleeding xone hour.

## 2014-08-28 NOTE — ED Notes (Signed)
Entered room to assess pt and pt had walked out of ED after speaking with Dr Blinda Leatherwood.  Pt left before discharge papers were given.

## 2014-08-28 NOTE — ED Provider Notes (Signed)
CSN: [id number]     Arrival date & time 08/28/14  0254 History   None    Chief Complaint  Patient presents with  . Pelvic Pain     (Consider location/radiation/quality/duration/timing/severity/associated sxs/prior Treatment) HPI Comments: This 10th visit in the last 5 months to this emergency department by this patient for complaints of pelvic pain. She has a history of chronic pain secondary to stated history of endometriosis. Patient has been given multiple prescriptions and dispenses of narcotics in the past. She reports following up with Dr. Emelda Fear, being started on birth control pills. She was told that she would not have any vaginal bleeding, but has had bleeding and increased pain tonight.  Patient is a 29 y.o. female presenting with pelvic pain.  Pelvic Pain    Past Medical History  Diagnosis Date  . Endometriosis   . GERD (gastroesophageal reflux disease)   . Pelvic pain in female 08/19/2014  . History of endometriosis 08/19/2014  . Irregular intermenstrual bleeding 08/19/2014   Past Surgical History  Procedure Laterality Date  . Laparoscopy     Family History  Problem Relation Age of Onset  . Endometriosis Mother    History  Substance Use Topics  . Smoking status: Current Some Day Smoker -- 1.00 packs/day    Types: Cigarettes  . Smokeless tobacco: Former Neurosurgeon  . Alcohol Use: Yes     Comment: occasional (yesterday)   OB History    Gravida Para Term Preterm AB TAB SAB Ectopic Multiple Living   3 2   1  1   2      Review of Systems  Genitourinary: Positive for pelvic pain.  All other systems reviewed and are negative.     Allergies  Codeine; Latex; Naproxen; Toradol; and Tramadol  Home Medications   Prior to Admission medications   Medication Sig Start Date End Date Taking? Authorizing Provider  norethindrone-ethinyl estradiol (JUNEL FE 1/20) 1-20 MG-MCG tablet Take 1 continuously, do not take inert pills 08/19/14  Yes Adline Potter, NP   HYDROcodone-acetaminophen (NORCO/VICODIN) 5-325 MG per tablet Take 1 tablet by mouth every 6 (six) hours as needed. 08/22/14   Adline Potter, NP  ibuprofen (ADVIL,MOTRIN) 600 MG tablet Take 1 tablet (600 mg total) by mouth every 6 (six) hours as needed for mild pain. 08/17/14   Kristen N Ward, DO  ondansetron (ZOFRAN ODT) 4 MG disintegrating tablet Take 1 tablet (4 mg total) by mouth every 8 (eight) hours as needed for nausea or vomiting. 08/17/14   Kristen N Ward, DO  oxyCODONE-acetaminophen (PERCOCET/ROXICET) 5-325 MG per tablet Take 2 tablets by mouth every 6 (six) hours as needed for severe pain. Patient not taking: Reported on 08/19/2014 08/17/14   Layla Maw Ward, DO  oxyCODONE-acetaminophen (PERCOCET/ROXICET) 5-325 MG per tablet Take 2 tablets by mouth every 6 (six) hours as needed for severe pain. Patient not taking: Reported on 08/19/2014 08/17/14   Kristen N Ward, DO   BP 105/78 mmHg  Pulse 67  Temp(Src) 98.1 F (36.7 C)  Resp 18  Ht 5\' 3"  (1.6 m)  Wt 116 lb (52.617 kg)  BMI 20.55 kg/m2  SpO2 99%  LMP 08/28/2014 Physical Exam  Constitutional: She is oriented to person, place, and time. She appears distressed.  HENT:  Head: Normocephalic and atraumatic.  Eyes: Pupils are equal, round, and reactive to light.  Pulmonary/Chest: No respiratory distress.  Musculoskeletal: Normal range of motion.  Neurological: She is alert and oriented to person, place, and time.  Psychiatric:  Her mood appears anxious.    ED Course  Procedures (including critical care time) Labs Review Labs Reviewed - No data to display  Imaging Review No results found.   EKG Interpretation None      MDM   Final diagnoses:  None   chronic pain  This is the 10th visit in the last several months for pelvic pain for this patient. She has been given multiple doses of narcotics, dispense packs of narcotics, prescriptions for narcotics. She is here tonight stating that she is having vaginal bleeding after  starting hormones prescribed by OB/GYN. I am very concerned about the pattern of visits to the emergency department in this patient. She has 16 visits in the last 6 months. Most are for pain complaints and patient has requested pain medications. I informed her that I would not give her any further pain medicine here in the ER tonight. Further, vital signs are normal and her bleeding is likely secondary to the hormones. She does not require any acute interventions for this and needs to follow-up with OB/GYN. Patient was upset that I was not addressing her pain, but she was informed that this is chronic pain and not in the domain of emergency medicine.   Gilda Crease, MD 08/28/14 [phone]

## 2014-08-28 NOTE — Discharge Instructions (Signed)

## 2014-08-28 NOTE — ED Notes (Signed)
Pt left after speaking with Dr Blinda Leatherwood but before given discharge instructions

## 2014-09-16 ENCOUNTER — Other Ambulatory Visit: Payer: Medicaid Other | Admitting: Adult Health

## 2014-09-20 ENCOUNTER — Emergency Department (HOSPITAL_COMMUNITY)
Admission: EM | Admit: 2014-09-20 | Discharge: 2014-09-20 | Disposition: A | Discharge status: Home / Self Care | Payer: Medicaid Other | Attending: Emergency Medicine | Admitting: Emergency Medicine

## 2014-09-20 ENCOUNTER — Encounter (HOSPITAL_COMMUNITY): Payer: Self-pay | Admitting: *Deleted

## 2014-09-20 DIAGNOSIS — Z8742 Personal history of other diseases of the female genital tract: Secondary | ICD-10-CM | POA: Diagnosis not present

## 2014-09-20 DIAGNOSIS — Z720 Tobacco use: Secondary | ICD-10-CM | POA: Diagnosis not present

## 2014-09-20 DIAGNOSIS — K088 Other specified disorders of teeth and supporting structures: Secondary | ICD-10-CM | POA: Insufficient documentation

## 2014-09-20 DIAGNOSIS — Z91040 Latex allergy status: Secondary | ICD-10-CM | POA: Insufficient documentation

## 2014-09-20 DIAGNOSIS — K029 Dental caries, unspecified: Secondary | ICD-10-CM | POA: Diagnosis not present

## 2014-09-20 DIAGNOSIS — K0889 Other specified disorders of teeth and supporting structures: Secondary | ICD-10-CM

## 2014-09-20 MED ORDER — BUPIVACAINE-EPINEPHRINE (PF) 0.5% -1:200000 IJ SOLN
INTRAMUSCULAR | Status: AC
  Filled 2014-09-20: qty 30

## 2014-09-20 MED ORDER — BUPIVACAINE-EPINEPHRINE (PF) 0.5% -1:200000 IJ SOLN: 10.0000 mL | Freq: Once | INTRAMUSCULAR | Status: DC

## 2014-09-20 MED ORDER — BUPIVACAINE HCL (PF) 0.5 % IJ SOLN: 10.0000 mL | Freq: Once | INTRAMUSCULAR | Status: DC

## 2014-09-20 MED ORDER — HYDROCODONE-ACETAMINOPHEN 5-325 MG PO TABS: 1.0000 | ORAL_TABLET | Freq: Four times a day (QID) | ORAL | Status: DC | PRN

## 2014-09-20 NOTE — ED Provider Notes (Signed)
CSN: [id number]     Arrival date & time 09/20/14  1736 History   None    Chief Complaint  Patient presents with  . Dental Pain     (Consider location/radiation/quality/duration/timing/severity/associated sxs/prior Treatment) HPI Comments: Patient presents today with right lower dental pain.  She reports that earlier today her tooth chipped while eating a Now and Later candy.  Pain has been constant since that time.  She states that she has taken OTC pain medication without relief.  She denies fever, chills, facial swelling, or neck stiffness.  She does not have a dentist.    The history is provided by the patient.    Past Medical History  Diagnosis Date  . Endometriosis   . GERD (gastroesophageal reflux disease)   . Pelvic pain in female 08/19/2014  . History of endometriosis 08/19/2014  . Irregular intermenstrual bleeding 08/19/2014   Past Surgical History  Procedure Laterality Date  . Laparoscopy     Family History  Problem Relation Age of Onset  . Endometriosis Mother    Social History  Substance Use Topics  . Smoking status: Current Some Day Smoker -- 1.00 packs/day    Types: Cigarettes  . Smokeless tobacco: Former Neurosurgeon  . Alcohol Use: Yes     Comment: occasional (yesterday)   OB History    Gravida Para Term Preterm AB TAB SAB Ectopic Multiple Living   Review of Systems  All other systems reviewed and are negative.     Allergies  Codeine; Latex; Naproxen; Toradol; and Tramadol  Home Medications   Prior to Admission medications   Medication Sig Start Date End Date Taking? Authorizing Provider  HYDROcodone-acetaminophen (NORCO/VICODIN) 5-325 MG per tablet Take 1 tablet by mouth every 6 (six) hours as needed. 08/22/14   Adline Potter, NP  ibuprofen (ADVIL,MOTRIN) 600 MG tablet Take 1 tablet (600 mg total) by mouth every 6 (six) hours as needed for mild pain. 08/17/14   Layla Maw Ward, DO  norethindrone-ethinyl estradiol (JUNEL FE 1/20)  1-20 MG-MCG tablet Take 1 continuously, do not take inert pills 08/19/14   Adline Potter, NP  ondansetron (ZOFRAN ODT) 4 MG disintegrating tablet Take 1 tablet (4 mg total) by mouth every 8 (eight) hours as needed for nausea or vomiting. 08/17/14   Layla Maw Ward, DO  oxyCODONE-acetaminophen (PERCOCET/ROXICET) 5-325 MG per tablet Take 2 tablets by mouth every 6 (six) hours as needed for severe pain. Patient not taking: Reported on 08/19/2014 08/17/14   Layla Maw Ward, DO  oxyCODONE-acetaminophen (PERCOCET/ROXICET) 5-325 MG per tablet Take 2 tablets by mouth every 6 (six) hours as needed for severe pain. Patient not taking: Reported on 08/19/2014 08/17/14   Kristen N Ward, DO   BP 114/71 mmHg  Pulse 105  Temp(Src) 98.3 F (36.8 C) (Oral)  Resp 20  Ht  (1.6 m)  Wt 116 lb (52.617 kg)  BMI 20.55 kg/m2  SpO2 100%  LMP 09/09/2014 Physical Exam  Constitutional: She appears well-developed and well-nourished.  HENT:  Head: Normocephalic and atraumatic.  Mouth/Throat: Uvula is midline and oropharynx is clear and moist. No trismus in the jaw. Dental caries present. No dental abscesses.  Right lower tenderness to palpation.  Dental decay of the right lower molar teeth No sublingual tenderness or swelling No submental or submandibular lymphadenopathy  Neck: Normal range of motion. Neck supple.  Cardiovascular: Normal rate, regular rhythm and normal heart sounds.  Pulmonary/Chest: Effort normal and breath sounds normal.  Neurological: She is alert.  Skin: Skin is warm and dry.  Psychiatric: She has a normal mood and affect.  Nursing note and vitals reviewed.   ED Course  NERVE BLOCK Date/Time: 09/22/2014 5:40 PM Performed by: Santiago Glad Authorized by: Santiago Glad Consent: Verbal consent obtained. Risks and benefits: risks, benefits and alternatives were discussed Consent given by: patient Patient understanding: patient states understanding of the procedure being  performed Patient consent: the patient's understanding of the procedure matches consent given Procedure consent: procedure consent matches procedure scheduled Patient identity confirmed: verbally with patient Time out: Immediately prior to procedure a "time out" was called to verify the correct patient, procedure, equipment, support staff and site/side marked as required. Indications: pain relief Nerve block body site: right lowere tooth. Laterality: right Patient sedated: no Preparation: Patient was prepped and draped in the usual sterile fashion. Needle gauge: 27 G Location technique: anatomical landmarks Local anesthetic: bupivacaine 0.5% with epinephrine Anesthetic total: 1.8 ml Outcome: pain improved Patient tolerance: Patient tolerated the procedure well with no immediate complications   (including critical care time) Labs Review Labs Reviewed - No data to display  Imaging Review No results found. I have personally reviewed and evaluated these images and lab results as part of my medical decision-making.   EKG Interpretation None      MDM   Final diagnoses:  None   Patient with dental pain after chipping her tooth on a piece of candy.   No signs of infection.  Dental block performed with pain relief.    Exam unconcerning for Ludwig's angina or spread of infection.  Antibiotics not indicated.  Urged patient to follow-up with dentist.  Patient given dental resource guide.  Return precautions given.     Santiago Glad, PA-C 09/22/14 1744  Laurence Spates, MD 09/22/14 [phone]

## 2014-09-20 NOTE — ED Notes (Signed)
Pt ate some very chewy candy today and broke tooth to right bottom, and pulled her bridge out

## 2014-09-20 NOTE — Discharge Instructions (Signed)
You have a dental injury. Use the resource guide listed below to help you find a dentist if you do not already have one to followup with. It is very important that you get evaluated by a dentist as soon as possible. Call tomorrow to schedule an appointment. Use your pain medication as prescribed and do not operate heavy machinery while on pain medication. Note that your pain medication contains acetaminophen (Tylenol) & its is not reccommended that you use additional acetaminophen (Tylenol) while taking this medication. Take your full course of antibiotics. Read the instructions below.  Eat a soft or liquid diet and rinse your mouth out after meals with warm water. You should see a dentist or return here at once if you have increased swelling, increased pain or uncontrolled bleeding from the site of your injury.   SEEK MEDICAL CARE IF:   You have increased pain not controlled with medicines.   You have swelling around your tooth, in your face or neck.   You have bleeding which starts, continues, or gets worse.   You have a fever >101  If you are unable to open your mouth

## 2014-09-21 ENCOUNTER — Emergency Department (HOSPITAL_COMMUNITY)
Admission: EM | Admit: 2014-09-21 | Discharge: 2014-09-21 | Disposition: A | Discharge status: Home / Self Care | Payer: Medicaid Other | Attending: Emergency Medicine | Admitting: Emergency Medicine

## 2014-09-21 ENCOUNTER — Encounter (HOSPITAL_COMMUNITY): Payer: Self-pay | Admitting: Emergency Medicine

## 2014-09-21 DIAGNOSIS — Z793 Long term (current) use of hormonal contraceptives: Secondary | ICD-10-CM | POA: Insufficient documentation

## 2014-09-21 DIAGNOSIS — Z91040 Latex allergy status: Secondary | ICD-10-CM | POA: Insufficient documentation

## 2014-09-21 DIAGNOSIS — Z8742 Personal history of other diseases of the female genital tract: Secondary | ICD-10-CM | POA: Diagnosis not present

## 2014-09-21 DIAGNOSIS — Z720 Tobacco use: Secondary | ICD-10-CM | POA: Insufficient documentation

## 2014-09-21 DIAGNOSIS — K088 Other specified disorders of teeth and supporting structures: Secondary | ICD-10-CM | POA: Insufficient documentation

## 2014-09-21 DIAGNOSIS — K0889 Other specified disorders of teeth and supporting structures: Secondary | ICD-10-CM

## 2014-09-21 DIAGNOSIS — Z8719 Personal history of other diseases of the digestive system: Secondary | ICD-10-CM | POA: Insufficient documentation

## 2014-09-21 NOTE — ED Notes (Signed)
Pt reports she was seen here yesterday for dental pain, medication she was given is not helping.

## 2014-09-21 NOTE — ED Provider Notes (Signed)
CSN: [id number]     Arrival date & time 09/21/14  1202 History   First MD Initiated Contact with Patient 09/21/14 1223     Chief Complaint  Patient presents with  . Dental Pain    Patient is a 29 y.o. female presenting with tooth pain. The history is provided by the patient.  Dental Pain Location:  Lower Severity:  Moderate Onset quality:  Gradual Timing:  Constant Progression:  Worsening Chronicity:  Recurrent Relieved by:  Nothing Associated symptoms: no fever     Past Medical History  Diagnosis Date  . Endometriosis   . GERD (gastroesophageal reflux disease)   . Pelvic pain in female 08/19/2014  . History of endometriosis 08/19/2014  . Irregular intermenstrual bleeding 08/19/2014   Past Surgical History  Procedure Laterality Date  . Laparoscopy     Family History  Problem Relation Age of Onset  . Endometriosis Mother    Social History  Substance Use Topics  . Smoking status: Current Some Day Smoker -- 1.00 packs/day    Types: Cigarettes  . Smokeless tobacco: Former Neurosurgeon  . Alcohol Use: Yes     Comment: occasional (yesterday)   OB History    Gravida Para Term Preterm AB TAB SAB Ectopic Multiple Living   Review of Systems  Constitutional: Negative for fever.  Gastrointestinal: Negative for vomiting.      Allergies  Codeine; Latex; Naproxen; Toradol; and Tramadol  Home Medications   Prior to Admission medications   Medication Sig Start Date End Date Taking? Authorizing Provider  HYDROcodone-acetaminophen (NORCO/VICODIN) 5-325 MG per tablet Take 1-2 tablets by mouth every 6 (six) hours as needed. 09/20/14   Heather Laisure, PA-C  ibuprofen (ADVIL,MOTRIN) 600 MG tablet Take 1 tablet (600 mg total) by mouth every 6 (six) hours as needed for mild pain. Patient taking differently: Take 800 mg by mouth every 6 (six) hours as needed for mild pain or moderate pain.  08/17/14   Kristen N Ward, DO  norethindrone-ethinyl estradiol (JUNEL FE 1/20)  1-20 MG-MCG tablet Take 1 continuously, do not take inert pills Patient taking differently: Take 1 tablet by mouth daily. Take 1 continuously, do not take inert pills 08/19/14   Adline Potter, NP  oxyCODONE-acetaminophen (PERCOCET/ROXICET) 5-325 MG per tablet Take 2 tablets by mouth every 6 (six) hours as needed for severe pain. Patient not taking: Reported on 08/19/2014 08/17/14   Layla Maw Ward, DO  oxyCODONE-acetaminophen (PERCOCET/ROXICET) 5-325 MG per tablet Take 2 tablets by mouth every 6 (six) hours as needed for severe pain. Patient not taking: Reported on 08/19/2014 08/17/14   Kristen N Ward, DO   BP 120/72 mmHg  Pulse 111  Temp(Src) 98.1 F (36.7 C) (Oral)  Resp 18  Ht  (1.6 m)  Wt 116 lb (52.617 kg)  BMI 20.55 kg/m2  SpO2 100%  LMP 09/09/2014 Physical Exam CONSTITUTIONAL: Well developed/well nourished, anxious HEAD AND FACE: Normocephalic/atraumatic EYES: EOMI/PERRL ENMT: Mucous membranes moist.  Poor dentition.  No trismus.  No focal abscess noted. NECK: supple no meningeal signs CV: S1/S2 noted LUNGS: Lungs are clear to auscultation bilaterally, no apparent distress NEURO: Pt is awake/alert, moves all extremitiesx4 EXTREMITIES:full ROM SKIN: warm, color normal  ED Course  Procedures  MDM   Final diagnoses:  Pain, dental    Nursing notes including past medical history and social history reviewed and considered in documentation   Advised need to call dentist  for further pain management  Advised narcotics will not be prescribed on this visit     Zadie Rhine, MD 09/21/14 1232

## 2014-09-21 NOTE — Discharge Instructions (Signed)

## 2014-09-21 NOTE — ED Provider Notes (Signed)
Pt just received narcotics on ED visit on 8/26 And has had multiple narcotics given by multiple providers in July 2016   Zadie Rhine, MD 09/21/14 1238

## 2014-09-21 NOTE — ED Notes (Signed)
Pt requested to speak with doctor or someone is charge stating she was pissed and she cant go all weekend in pain. Spoke with Dr Bebe Shaggy to speak with pt again with security standby. Pt escorted out by security

## 2014-09-25 ENCOUNTER — Telehealth: Payer: Self-pay | Admitting: *Deleted

## 2014-09-25 ENCOUNTER — Ambulatory Visit: Payer: Medicaid Other | Admitting: Obstetrics & Gynecology

## 2014-09-25 NOTE — Telephone Encounter (Signed)
Spoke with pt. Pt is requesting a refill on Vicodin. Pt is having trouble getting PCP off of card. Pt is having 2-3 periods monthly. Please advise. Thanks!! JSY

## 2014-09-25 NOTE — Telephone Encounter (Signed)
Spoke with pt letting her know JAG advised to continue taking the birth control. Pain meds can't be refilled without her being seen. Offered an appt to see Dr. Despina Hidden tomorrow, but pt will have to pay out of pocket for the visit due to insurance issues. Pt states she can't pay out of pocket and would have to find another Dr, then hung up. JSY

## 2014-10-01 ENCOUNTER — Ambulatory Visit (INDEPENDENT_AMBULATORY_CARE_PROVIDER_SITE_OTHER): Payer: Medicaid Other | Admitting: Obstetrics and Gynecology

## 2014-10-01 ENCOUNTER — Encounter: Payer: Self-pay | Admitting: Obstetrics and Gynecology

## 2014-10-01 VITALS — BP 110/70 | HR 80 | Ht 62.0 in | Wt 113.4 lb

## 2014-10-01 DIAGNOSIS — N898 Other specified noninflammatory disorders of vagina: Secondary | ICD-10-CM

## 2014-10-01 LAB — POCT WET PREP (WET MOUNT): Trichomonas Wet Prep HPF POC: POSITIVE

## 2014-10-01 MED ORDER — METRONIDAZOLE 500 MG PO TABS: 2000.0000 mg | ORAL_TABLET | Freq: Once | ORAL | Status: AC

## 2014-10-01 MED ORDER — HYDROCODONE-ACETAMINOPHEN 5-325 MG PO TABS
1.0000 | ORAL_TABLET | Freq: Four times a day (QID) | ORAL | Status: DC | PRN
Start: 2014-10-01 — End: 2014-11-03

## 2014-10-01 NOTE — Progress Notes (Signed)
Patient ID: Joyce Ward, female   DOB: Dec 21, 1985, 29 y.o.   MRN: [id number]   [facility] Clinic Visit  Patient name: Joyce Ward MRN [id number]  Date of birth: 02-Apr-1985  CC & HPI:  Joyce Ward is a 29 y.o. female presenting today for pelvic pain ,endometriosis by prior laparoscopy x 2.  Pt has been bleeding since started on ocp.  ROS:  In relationship x 3 yr , partner has taken 2 wk trip recently. No IVDU, no known third parties  Pertinent History Reviewed:   Reviewed: Significant for chronic pain Medical         Past Medical History  Diagnosis Date  . Endometriosis   . GERD (gastroesophageal reflux disease)   . Pelvic pain in female 08/19/2014  . History of endometriosis 08/19/2014  . Irregular intermenstrual bleeding 08/19/2014                              Surgical Hx:    Past Surgical History  Procedure Laterality Date  . Laparoscopy     Medications: Reviewed & Updated - see associated section                       Current outpatient prescriptions:  .  ibuprofen (ADVIL,MOTRIN) 600 MG tablet, Take 1 tablet (600 mg total) by mouth every 6 (six) hours as needed for mild pain. (Patient taking differently: Take 800 mg by mouth every 6 (six) hours as needed for mild pain or moderate pain. ), Disp: 30 tablet, Rfl: 0 .  norethindrone-ethinyl estradiol (JUNEL FE 1/20) 1-20 MG-MCG tablet, Take 1 continuously, do not take inert pills (Patient taking differently: Take 1 tablet by mouth daily. Take 1 continuously, do not take inert pills), Disp: 1 Package, Rfl: 11 .  HYDROcodone-acetaminophen (NORCO/VICODIN) 5-325 MG per tablet, Take 1 tablet by mouth every 6 (six) hours as needed for moderate pain., Disp: 40 tablet, Rfl: 0 .  oxyCODONE-acetaminophen (PERCOCET/ROXICET) 5-325 MG per tablet, Take 2 tablets by mouth every 6 (six) hours as needed for severe pain. (Patient not taking: Reported on 08/19/2014), Disp: 10 tablet, Rfl: 0 .  oxyCODONE-acetaminophen (PERCOCET/ROXICET)  5-325 MG per tablet, Take 2 tablets by mouth every 6 (six) hours as needed for severe pain. (Patient not taking: Reported on 08/19/2014), Disp: 6 tablet, Rfl: 0   Social History: Reviewed -  reports that she has been smoking Cigarettes.  She has been smoking about 1.00 pack per day. She has quit using smokeless tobacco.  Objective Findings:  Vitals: Blood pressure 110/70, pulse 80, height  (1.575 m), weight 51.438 kg (113 lb 6.4 oz), last menstrual period 09/09/2014.  Physical Examination: General appearance - alert, well appearing, and in no distress, oriented to person, place, and time, normal appearing weight and well hydrated Mental status - alert, oriented to person, place, and time, normal mood, behavior, speech, dress, motor activity, and thought processes, affect appropriate to mood Eyes - pupils equal and reactive, extraocular eye movements intact Abdomen - soft, nontender, nondistended, no masses or organomegaly Pelvic - VULVA: normal appearing vulva with no masses, tenderness or lesions, VAGINA: normal appearing vagina with normal color and discharge, no lesions, PELVIC FLOOR EXAM: uterine descensus grade 1, vaginal tenderness sidewalls, vaginal discharge - copious, creamy and grey, CERVIX: normal appearing cervix without discharge or lesions, cervical discharge present - copious and grey, UTERUS: anteverted, mobile, pt diffusely tender, , ADNEXA: normal adnexa in  size, nontender and no masses, no palpable internal organs Extremities - peripheral pulses normal, no pedal edema, no clubbing or cyanosis Wet prep + trich GC/Chl pending  Assessment & Plan:   A:  1.  Hx endometriosis 2. Chronic pelvic pain 3. trichomoniasis  P:  1. Flagyl 2continue ocp 3 refil HC 4 consider megace if BTB occurs 5 given info andbrochures

## 2014-10-02 ENCOUNTER — Telehealth: Payer: Self-pay | Admitting: *Deleted

## 2014-10-02 ENCOUNTER — Ambulatory Visit (INDEPENDENT_AMBULATORY_CARE_PROVIDER_SITE_OTHER): Payer: Medicaid Other | Admitting: Obstetrics and Gynecology

## 2014-10-02 ENCOUNTER — Encounter: Payer: Self-pay | Admitting: Obstetrics and Gynecology

## 2014-10-02 ENCOUNTER — Telehealth: Payer: Self-pay | Admitting: Obstetrics and Gynecology

## 2014-10-02 VITALS — BP 120/80 | Ht 62.0 in | Wt 113.0 lb

## 2014-10-02 DIAGNOSIS — R102 Pelvic and perineal pain: Secondary | ICD-10-CM | POA: Diagnosis not present

## 2014-10-02 DIAGNOSIS — T7840XA Allergy, unspecified, initial encounter: Secondary | ICD-10-CM

## 2014-10-02 DIAGNOSIS — A5901 Trichomonal vulvovaginitis: Secondary | ICD-10-CM | POA: Diagnosis not present

## 2014-10-02 LAB — GC/CHLAMYDIA PROBE AMP
Chlamydia trachomatis, NAA: NEGATIVE
NEISSERIA GONORRHOEAE BY PCR: NEGATIVE

## 2014-10-02 MED ORDER — OXYCODONE-ACETAMINOPHEN 5-325 MG PO TABS: 2.0000 | ORAL_TABLET | Freq: Four times a day (QID) | ORAL | Status: DC | PRN

## 2014-10-02 NOTE — Progress Notes (Signed)
   This chart was scribed for Tilda Burrow, MD by Jarvis Morgan, ED Scribe. This patient was seen in Office and the patient's care was started at 2:15 PM.  [facility]  Patient name: Joyce Ward MRN [id number]  Date of birth: Sep 19, 1985  CC & HPI:  Joyce Ward is a 29 y.o. female presenting today for follow up of: hydrocodone pain medication, pt is taking for mgmt of her ongoing pelvic pain, endometriosis by prior laparoscopy x2.  She notes abnormal vaginal bleeding since starting on ocp. She reports that the hydrocodone pain medicine that she started yesterday has caused her to break out in red itchy rash to her neck and chest. She would like a new rx. Pt states she is also allergic to Tramadol which causes hives. ----------------------------------------------------------------------------------------------------------- Medication Compliance: compliant all of the time  Diet Compliance: N/A   ROS:  PER HPI  Pertinent History Reviewed:  Medical & Surgical Hx:  Reviewed: Significant for endometriosis, pelvic pain, irregular menstrual bleeding, laparoscopy Medications: Reviewed & Updated - See associated section in EMR Social History: Reviewed -  reports that she has been smoking Cigarettes.  She has been smoking about 1.00 pack per day. She has quit using smokeless tobacco.   Objective Findings:  Vitals: BP 120/80 mmHg  Ht  (1.575 m)  Wt 113 lb (51.256 kg)  BMI 20.66 kg/m2  LMP 09/09/2014  Physical Examination: not indicated  Exam  Diffuse urticaria of chest an neck, no SOB or wheezing or respiratory changes. Assessment & Plan:   A: 1.Hx endometriosis 2. Chronic pelvic pain 3. Trichomoniasis  P: 1. 1 time rx for oxycodone 2. Flagyl completed 3. Continue ocp in continuous fashion. 4 pt to sign Releas of INFO for endometriosis surgeries.  I personally performed the services described in this documentation, which was SCRIBED in my presence. The recorded  information has been reviewed and considered accurate. It has been edited as necessary during review. Tilda Burrow, MD

## 2014-10-02 NOTE — Telephone Encounter (Signed)
Pt aware to take all four pills at the same time.

## 2014-10-02 NOTE — Telephone Encounter (Signed)
Pt walked into office and an appointment for this afternoon was given.

## 2014-10-02 NOTE — Progress Notes (Signed)
Patient ID: Joyce Ward, female   DOB: [birth year], 29 y.o.   MRN: [id number] Pt worked in today for pain medication. Pt states that the pain medication she was given yesterday caused her to have a rash on her neck and chest areas, and caused itching. Pt would like a new Rx for Oxycontin.

## 2014-10-04 ENCOUNTER — Other Ambulatory Visit: Payer: Self-pay | Admitting: Obstetrics and Gynecology

## 2014-10-04 ENCOUNTER — Encounter: Payer: Self-pay | Admitting: *Deleted

## 2014-10-04 MED ORDER — OXYCODONE-ACETAMINOPHEN 5-325 MG PO TABS: 2.0000 | ORAL_TABLET | Freq: Four times a day (QID) | ORAL | Status: DC | PRN
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
## 2014-10-21 ENCOUNTER — Encounter (HOSPITAL_COMMUNITY): Payer: Self-pay | Admitting: Emergency Medicine

## 2014-10-21 ENCOUNTER — Emergency Department (HOSPITAL_COMMUNITY)
Admission: EM | Admit: 2014-10-21 | Discharge: 2014-10-21 | Disposition: A | Discharge status: Home / Self Care | Payer: Medicaid Other | Attending: Emergency Medicine | Admitting: Emergency Medicine

## 2014-10-21 DIAGNOSIS — R0981 Nasal congestion: Secondary | ICD-10-CM | POA: Insufficient documentation

## 2014-10-21 DIAGNOSIS — J029 Acute pharyngitis, unspecified: Secondary | ICD-10-CM | POA: Insufficient documentation

## 2014-10-21 DIAGNOSIS — R51 Headache: Secondary | ICD-10-CM | POA: Insufficient documentation

## 2014-10-21 DIAGNOSIS — R05 Cough: Secondary | ICD-10-CM | POA: Diagnosis present

## 2014-10-21 DIAGNOSIS — M791 Myalgia: Secondary | ICD-10-CM | POA: Insufficient documentation

## 2014-10-21 DIAGNOSIS — J40 Bronchitis, not specified as acute or chronic: Secondary | ICD-10-CM

## 2014-10-21 DIAGNOSIS — J209 Acute bronchitis, unspecified: Secondary | ICD-10-CM | POA: Diagnosis not present

## 2014-10-21 DIAGNOSIS — Z91040 Latex allergy status: Secondary | ICD-10-CM | POA: Diagnosis not present

## 2014-10-21 DIAGNOSIS — Z8742 Personal history of other diseases of the female genital tract: Secondary | ICD-10-CM | POA: Insufficient documentation

## 2014-10-21 DIAGNOSIS — Z720 Tobacco use: Secondary | ICD-10-CM | POA: Diagnosis not present

## 2014-10-21 DIAGNOSIS — Z8719 Personal history of other diseases of the digestive system: Secondary | ICD-10-CM | POA: Diagnosis not present

## 2014-10-21 MED ORDER — PREDNISONE 10 MG PO TABS
ORAL_TABLET | ORAL | Status: DC
Start: 2014-10-21 — End: 2014-11-13

## 2014-10-21 MED ORDER — LORATADINE-PSEUDOEPHEDRINE ER 5-120 MG PO TB12: 1.0000 | ORAL_TABLET | Freq: Two times a day (BID) | ORAL | Status: DC

## 2014-10-21 MED ORDER — AMOXICILLIN 500 MG PO CAPS: 500.0000 mg | ORAL_CAPSULE | Freq: Three times a day (TID) | ORAL | Status: DC

## 2014-10-21 MED ORDER — HYDROCODONE-HOMATROPINE 5-1.5 MG/5ML PO SYRP: 5.0000 mL | ORAL_SOLUTION | Freq: Four times a day (QID) | ORAL | Status: DC | PRN

## 2014-10-21 NOTE — ED Notes (Addendum)
Patient c/o cough with congestion, sinus pressure, sore throat, and headache. Per patient cough productive with thick yellow, gray sputum. Denies any fevers. Per patient sharp stabbing pain with cough or deep breath. Patient also reports some shortness of breath with cough. Patient states she took mucinex and advil with no relief.

## 2014-10-21 NOTE — ED Notes (Signed)
Discharge instructions and prescriptions reviewed with pt . Work note provided - pt verbalized understanding .Ambulated off unit by herself

## 2014-10-21 NOTE — ED Provider Notes (Signed)
CSN: [id number]     Arrival date & time 10/21/14  1654 History  By signing my name below, I, Lyndel Safe, attest that this documentation has been prepared under the direction and in the presence of Ivery Quale, PA-C. Electronically Signed: Lyndel Safe, ED Scribe. 10/21/2014. 5:54 PM.    Chief Complaint  Patient presents with  . Cough   The history is provided by the patient. No language interpreter was used.   HPI Comments: Joyce Ward is a 29 y.o. female, with a PMhx of endometriosis, who presents to the Emergency Department complaining of constant, worsening URI symptoms X 7 days. Her URI symptoms are significant for nasal congestion, sinus pressure, sore throat, a headache, myalgias, and a productive cough with thick yellow sputum. The pt also endorses feeling SOB and a sharp stabbing pain in her chest with coughing. She has taken Mucinex and Advil with no relief. She describes the symptoms to have begun with nasal congestion and the other symptoms following. Pt additionally reports a small amount of blood in the tissue when she blows her nose. She states she is still able to swallow fluids but is having a hard time swallowing solid foods secondary to sore throat pain. Denies fevers or hemoptysis. Pt is a current smoker.   Past Medical History  Diagnosis Date  . Endometriosis   . GERD (gastroesophageal reflux disease)   . Pelvic pain in female 08/19/2014  . History of endometriosis 08/19/2014  . Irregular intermenstrual bleeding 08/19/2014   Past Surgical History  Procedure Laterality Date  . Laparoscopy     Family History  Problem Relation Age of Onset  . Endometriosis Mother    Social History  Substance Use Topics  . Smoking status: Current Some Day Smoker -- 1.00 packs/day for 5 years    Types: Cigarettes  . Smokeless tobacco: Never Used  . Alcohol Use: Yes     Comment: occasional   OB History    Gravida Para Term Preterm AB TAB SAB Ectopic Multiple Living   Review of Systems  Constitutional: Negative for fever.  HENT: Positive for congestion, sinus pressure, sore throat and trouble swallowing.   Respiratory: Positive for cough, chest tightness and shortness of breath.   Musculoskeletal: Positive for myalgias.  Neurological: Positive for headaches.  All other systems reviewed and are negative.  Allergies  Codeine; Latex; Naproxen; Toradol; and Tramadol  Home Medications   Prior to Admission medications   Medication Sig Start Date End Date Taking? Authorizing Jisell Majer  HYDROcodone-acetaminophen (NORCO/VICODIN) 5-325 MG per tablet Take 1 tablet by mouth every 6 (six) hours as needed for moderate pain. Patient not taking: Reported on 10/02/2014 10/01/14   Tilda Burrow, MD  ibuprofen (ADVIL,MOTRIN) 600 MG tablet Take 1 tablet (600 mg total) by mouth every 6 (six) hours as needed for mild pain. Patient taking differently: Take 800 mg by mouth every 6 (six) hours as needed for mild pain or moderate pain.  08/17/14   Kristen N Ward, DO  oxyCODONE-acetaminophen (PERCOCET/ROXICET) 5-325 MG per tablet Take 2 tablets by mouth every 6 (six) hours as needed for severe pain. 10/04/14   Tilda Burrow, MD   BP 107/68 mmHg  Pulse 107  Temp(Src) 98.4 F (36.9 C) (Oral)  Resp 18  Ht  (1.575 m)  Wt 119 lb (53.978 kg)  BMI 21.76 kg/m2  SpO2 100%  LMP 09/30/2014 Physical Exam  Constitutional:  She is oriented to person, place, and time. She appears well-developed and well-nourished. No distress.  HENT:  Head: Normocephalic.  Mouth/Throat: Oropharyngeal exudate present.  Fluid behind bilateral ear drums but no bulging of bilateral TMs; nasal congestion present; uvula enlarged, mild posterior oropharyngeal exudates, airway patent.   Eyes: Conjunctivae and EOM are normal. Pupils are equal, round, and reactive to light.  Neck: Normal range of motion. Neck supple.  A few palpable cervical nodes present.   Cardiovascular: Normal rate,  regular rhythm and normal heart sounds.   Pulmonary/Chest: Effort normal and breath sounds normal. No respiratory distress. She has no wheezes. She exhibits tenderness ( chest wall tendereness).  Symmetrical rise and fall of chest; pt speaks in complete sentences; lungs are mostly clear, tachypnea at about 20 breaths per minute; mild to moderate chest wall tenderness.   Abdominal: Soft. Bowel sounds are normal. There is no tenderness.  Musculoskeletal: Normal range of motion.  Lymphadenopathy:    She has cervical adenopathy.  Neurological: She is alert and oriented to person, place, and time. Coordination normal.  Skin: Skin is warm. No rash noted.  Psychiatric: She has a normal mood and affect. Her behavior is normal.  Nursing note and vitals reviewed.   ED Course  Procedures  DIAGNOSTIC STUDIES: Oxygen Saturation is 100% on RA, normal by my interpretation.    COORDINATION OF CARE: 5:53 PM Discussed treatment plan with pt at bedside and pt agreed to plan.   MDM  Vital signs reviewed. The examination favors upper respiratory infection, with bronchitis and pharyngitis. The patient will use salt water gargles, as well as prescription given for Hycodan, prednisone, Amoxil, and Claritin-D for congestion.    Final diagnoses:  Pharyngitis  Bronchitis    *I have reviewed nursing notes, vital signs, and all appropriate lab and imaging results for this patient.**  **I personally performed the services described in this documentation, which was scribed in my presence. The recorded information has been reviewed and is accurate.Ivery Quale, PA-C 10/21/14 1811  Mancel Bale, MD 10/22/14 [phone]

## 2014-10-21 NOTE — Discharge Instructions (Signed)
Your examination is consistent with bronchitis and pharyngitis. Please use a water gargles 2 or 3 times daily. Use Tylenol every 4 hours for fever or aching. Please take the prednisone taper and cough medication with food. The cough medication may cause drowsiness, please use this medication with caution. Please use your mass, and wash hands frequently until the symptoms have resolved. Upper Respiratory Infection, Adult An upper respiratory infection (URI) is also known as the common cold. It is often caused by a type of germ (virus). Colds are easily spread (contagious). You can pass it to others by kissing, coughing, sneezing, or drinking out of the same glass. Usually, you get better in 1 or 2 weeks.  HOME CARE   Only take medicine as told by your doctor.  Use a warm mist humidifier or breathe in steam from a hot shower.  Drink enough water and fluids to keep your pee (urine) clear or pale yellow.  Get plenty of rest.  Return to work when your temperature is back to normal or as told by your doctor. You may use a face mask and wash your hands to stop your cold from spreading. GET HELP RIGHT AWAY IF:   After the first few days, you feel you are getting worse.  You have questions about your medicine.  You have chills, shortness of breath, or brown or red spit (mucus).  You have yellow or brown snot (nasal discharge) or pain in the face, especially when you bend forward.  You have a fever, puffy (swollen) neck, pain when you swallow, or white spots in the back of your throat.  You have a bad headache, ear pain, sinus pain, or chest pain.  You have a high-pitched whistling sound when you breathe in and out (wheezing).  You have a lasting cough or cough up blood.  You have sore muscles or a stiff neck. MAKE SURE YOU:   Understand these instructions.  Will watch your condition.  Will get help right away if you are not doing well or get worse. Document Released: 06/30/2007  Document Revised: 04/05/2011 Document Reviewed: 04/18/2013 [facility] Patient Information 2015 [place], [place]. This information is not intended to replace advice given to you by your health care provider. Make sure you discuss any questions you have with your health care provider.

## 2014-10-22 ENCOUNTER — Emergency Department (HOSPITAL_COMMUNITY)
Admission: EM | Admit: 2014-10-22 | Discharge: 2014-10-22 | Disposition: A | Discharge status: Home / Self Care | Payer: Medicaid Other | Attending: Emergency Medicine | Admitting: Emergency Medicine

## 2014-10-22 ENCOUNTER — Emergency Department (HOSPITAL_COMMUNITY): Payer: Medicaid Other

## 2014-10-22 ENCOUNTER — Encounter (HOSPITAL_COMMUNITY): Payer: Self-pay | Admitting: Emergency Medicine

## 2014-10-22 DIAGNOSIS — R091 Pleurisy: Secondary | ICD-10-CM | POA: Diagnosis not present

## 2014-10-22 DIAGNOSIS — R000 Tachycardia, unspecified: Secondary | ICD-10-CM | POA: Insufficient documentation

## 2014-10-22 DIAGNOSIS — Z792 Long term (current) use of antibiotics: Secondary | ICD-10-CM | POA: Diagnosis not present

## 2014-10-22 DIAGNOSIS — Z720 Tobacco use: Secondary | ICD-10-CM | POA: Diagnosis not present

## 2014-10-22 DIAGNOSIS — Z8742 Personal history of other diseases of the female genital tract: Secondary | ICD-10-CM | POA: Diagnosis not present

## 2014-10-22 DIAGNOSIS — Z91040 Latex allergy status: Secondary | ICD-10-CM | POA: Diagnosis not present

## 2014-10-22 DIAGNOSIS — R509 Fever, unspecified: Secondary | ICD-10-CM | POA: Diagnosis present

## 2014-10-22 DIAGNOSIS — Z79899 Other long term (current) drug therapy: Secondary | ICD-10-CM | POA: Insufficient documentation

## 2014-10-22 DIAGNOSIS — B349 Viral infection, unspecified: Secondary | ICD-10-CM | POA: Insufficient documentation

## 2014-10-22 DIAGNOSIS — Z8719 Personal history of other diseases of the digestive system: Secondary | ICD-10-CM | POA: Insufficient documentation

## 2014-10-22 LAB — POC URINE PREG, ED: PREG TEST UR: NEGATIVE

## 2014-10-22 MED ORDER — ALBUTEROL SULFATE (2.5 MG/3ML) 0.083% IN NEBU
2.5000 mg | INHALATION_SOLUTION | Freq: Once | RESPIRATORY_TRACT | Status: AC
Start: 2014-10-22 — End: 2014-10-22
  Administered 2014-10-22: 2.5 mg via RESPIRATORY_TRACT
  Filled 2014-10-22: qty 3

## 2014-10-22 MED ORDER — HYDROCODONE-ACETAMINOPHEN 5-325 MG PO TABS
2.0000 | ORAL_TABLET | Freq: Once | ORAL | Status: AC
  Administered 2014-10-22: 2 via ORAL
  Filled 2014-10-22: qty 2

## 2014-10-22 MED ORDER — ALBUTEROL SULFATE HFA 108 (90 BASE) MCG/ACT IN AERS: 1.0000 | INHALATION_SPRAY | Freq: Four times a day (QID) | RESPIRATORY_TRACT | Status: DC | PRN

## 2014-10-22 NOTE — ED Provider Notes (Signed)
CSN: [id number]     Arrival date & time 10/22/14  1515 History   First MD Initiated Contact with Patient 10/22/14 1556     Chief Complaint  Patient presents with  . Fever     (Consider location/radiation/quality/duration/timing/severity/associated sxs/prior Treatment) HPI Comments: Patient is a 29 year old female who presents to the emergency department with a complaint of "feeling worse". Patient was seen in the emergency department on yesterday September 26 at which time she complained of nasal congestion, sinus pressure, headache, productive cough, chest and ribs soreness, and general malaise. She said she had some chills, but had not actually measured a temperature. There was no history of hemoptysis. It is of note that the patient is a smoker. She has not had any injury or trauma to the chest. There's been no recent operations or procedures involving the chest or lungs.  Today the patient returns stating that she feels worse. She says that she has had temperature elevation at home as high as 103.9. She took ibuprofen at about 2:00 this evening. She has not had vomiting or diarrhea. She states she continues to have chills. She is not coughing up any blood. She requests to be reevaluated for the symptoms.  Patient is a 29 y.o. female presenting with fever. The history is provided by the patient.  Fever Associated symptoms: congestion, cough, headaches, myalgias and sore throat     Past Medical History  Diagnosis Date  . Endometriosis   . GERD (gastroesophageal reflux disease)   . Pelvic pain in female 08/19/2014  . History of endometriosis 08/19/2014  . Irregular intermenstrual bleeding 08/19/2014   Past Surgical History  Procedure Laterality Date  . Laparoscopy     Family History  Problem Relation Age of Onset  . Endometriosis Mother    Social History  Substance Use Topics  . Smoking status: Current Some Day Smoker -- 1.00 packs/day for 5 years    Types: Cigarettes  .  Smokeless tobacco: Never Used  . Alcohol Use: Yes     Comment: occasional   OB History    Gravida Para Term Preterm AB TAB SAB Ectopic Multiple Living   Review of Systems  Constitutional: Positive for fever.  HENT: Positive for congestion, sinus pressure and sore throat.   Respiratory: Positive for cough and chest tightness.   Musculoskeletal: Positive for myalgias.  Neurological: Positive for headaches.  All other systems reviewed and are negative.     Allergies  Codeine; Latex; Naproxen; Toradol; and Tramadol  Home Medications   Prior to Admission medications   Medication Sig Start Date End Date Taking? Authorizing Provider  amoxicillin (AMOXIL) 500 MG capsule Take 1 capsule (500 mg total) by mouth 3 (three) times daily. 10/21/14   Ivery Quale, PA-C  HYDROcodone-acetaminophen (NORCO/VICODIN) 5-325 MG per tablet Take 1 tablet by mouth every 6 (six) hours as needed for moderate pain. Patient not taking: Reported on 10/02/2014 10/01/14   Tilda Burrow, MD  HYDROcodone-homatropine [facility]) 5-1.5 MG/5ML syrup Take 5 mLs by mouth every 6 (six) hours as needed. 10/21/14   Ivery Quale, PA-C  ibuprofen (ADVIL,MOTRIN) 200 MG tablet Take 200 mg by mouth every 6 (six) hours as needed for mild pain or moderate pain.    Historical Provider, MD  ibuprofen (ADVIL,MOTRIN) 600 MG tablet Take 1 tablet (600 mg total) by mouth every 6 (six) hours as needed for mild pain. Patient not taking: Reported on 10/21/2014  08/17/14   Kristen N Ward, DO  loratadine-pseudoephedrine (CLARITIN-D 12 HOUR) 5-120 MG per tablet Take 1 tablet by mouth 2 (two) times daily. 10/21/14   Ivery Quale, PA-C  oxyCODONE-acetaminophen (PERCOCET/ROXICET) 5-325 MG per tablet Take 2 tablets by mouth every 6 (six) hours as needed for severe pain. Patient not taking: Reported on 10/21/2014 10/04/14   Tilda Burrow, MD  predniSONE (DELTASONE) 10 MG tablet 5,4,3,2,1 - take with food 10/21/14   Ivery Quale, PA-C    BP 111/76 mmHg  Pulse 109  Temp(Src) 98.8 F (37.1 C) (Oral)  Resp 20  Ht  (1.575 m)  Wt 119 lb (53.978 kg)  BMI 21.76 kg/m2  SpO2 100%  LMP 09/30/2014 Physical Exam  Constitutional: She is oriented to person, place, and time. She appears well-developed and well-nourished.  Non-toxic appearance.  HENT:  Head: Normocephalic.  Right Ear: Tympanic membrane and external ear normal.  Left Ear: Tympanic membrane and external ear normal.  There is some mild to moderate increased redness of the posterior pharynx. There are small patches in the back of the throat.  Eyes: EOM and lids are normal. Pupils are equal, round, and reactive to light.  Neck: Normal range of motion. Neck supple. Carotid bruit is not present.  No rigidity appreciated.  Cardiovascular: Regular rhythm, normal heart sounds, intact distal pulses and normal pulses.  Tachycardia present.   Pulmonary/Chest: Breath sounds normal. No respiratory distress.  Right and left chest wall soreness. Patient complains of pain when asked to take a deep breath. She speaks however in complete sentences. There is symmetrical rise and fall of the chest.  Abdominal: Soft. Bowel sounds are normal. There is no tenderness. There is no guarding.  Musculoskeletal: Normal range of motion.  Lymphadenopathy:       Head (right side): No submandibular adenopathy present.       Head (left side): No submandibular adenopathy present.    She has no cervical adenopathy.  Neurological: She is alert and oriented to person, place, and time. She has normal strength. No cranial nerve deficit or sensory deficit.  Skin: Skin is warm and dry. No rash noted.  Psychiatric: She has a normal mood and affect. Her speech is normal.  Nursing note and vitals reviewed.   ED Course  Procedures (including critical care time) Labs Review Labs Reviewed - No data to display  Imaging Review No results found. I have personally reviewed and evaluated these images  and lab results as part of my medical decision-making.   EKG Interpretation None      MDM  Vital signs reviewed. Pulse oximetry is 100% on room air. A chest x-ray was obtained. The chest x-ray is read as negative for acute event. The temperature in the emergency department was 98.4.  I discussed with the patient the findings on the examination, as well as the findings of the x-ray report. I started to review again the need for increase fluids, wash hands frequently. Discussed with the patient that this was a viral illness, but no hypoxia or pneumonia appreciated at this time. I also explained to the patient that we would use the albuterol inhaler to assist with her breathing and congestion. I asked her to continue the medications. Emphasized to her that this would take some time to improve. Patient in stated that she did not get her Hycodan syrup filled because she says it was "too expensive", and requested oral pain medication. Reemphasize the vital signs, of the examination, and the chest  x-ray. Advised patient to continue with her current medications. And to follow-up with her primary physician if not improving.    Final diagnoses:  None    **I have reviewed nursing notes, vital signs, and all appropriate lab and imaging results for this patient.Ivery Quale, PA-C 10/22/14 1739  Gilda Crease, MD 10/22/14 2052

## 2014-10-22 NOTE — ED Notes (Signed)
Fever at home 103.9.  Current temperature 98.8.  Pt took 3 Advil at 1400.

## 2014-10-22 NOTE — Discharge Instructions (Signed)
Your temperature today in the emergency department was 98.8. Your oxygen level is 100% on room air. Your chest x-ray is negative for pneumonia or any other acute event. Please continue the medications prescribed on yesterday September 26. Please add albuterol inhaler, 2 puffs every 4 hours. Please see your Medicaid access physician for additional evaluation if not improving. Viral Infections A virus is a type of germ. Viruses can cause:  Minor sore throats.  Aches and pains.  Headaches.  Runny nose.  Rashes.  Watery eyes.  Tiredness.  Coughs.  Loss of appetite.  Feeling sick to your stomach (nausea).  Throwing up (vomiting).  Watery poop (diarrhea). HOME CARE   Only take medicines as told by your doctor.  Drink enough water and fluids to keep your pee (urine) clear or pale yellow. Sports drinks are a good choice.  Get plenty of rest and eat healthy. Soups and broths with crackers or rice are fine. GET HELP RIGHT AWAY IF:   You have a very bad headache.  You have shortness of breath.  You have chest pain or neck pain.  You have an unusual rash.  You cannot stop throwing up.  You have watery poop that does not stop.  You cannot keep fluids down.  You or your child has a temperature by mouth above 102 F (38.9 C), not controlled by medicine.  Your baby is older than 3 months with a rectal temperature of 102 F (38.9 C) or higher.  Your baby is 53 months old or younger with a rectal temperature of 100.4 F (38 C) or higher. MAKE SURE YOU:   Understand these instructions.  Will watch this condition.  Will get help right away if you are not doing well or get worse. Document Released: 12/25/2007 Document Revised: 04/05/2011 Document Reviewed: 05/19/2010 [facility] Patient Information 2015 [place], [place]. This information is not intended to replace advice given to you by your health care provider. Make sure you discuss any questions you have with your health  care provider.

## 2014-10-22 NOTE — ED Notes (Addendum)
Pt here yesterday for same symptoms and given multiple medications with no relief.  Pt wrapped in coat and hood.

## 2014-10-22 NOTE — ED Notes (Signed)
Patient sitting in stretcher eating crackers. Voices no complaints.

## 2014-10-23 ENCOUNTER — Ambulatory Visit: Payer: Medicaid Other | Admitting: Obstetrics and Gynecology

## 2014-11-03 ENCOUNTER — Emergency Department (HOSPITAL_COMMUNITY)
Admission: EM | Admit: 2014-11-03 | Discharge: 2014-11-03 | Disposition: A | Discharge status: Home / Self Care | Payer: Medicaid Other | Attending: Emergency Medicine | Admitting: Emergency Medicine

## 2014-11-03 ENCOUNTER — Encounter (HOSPITAL_COMMUNITY): Payer: Self-pay | Admitting: Emergency Medicine

## 2014-11-03 DIAGNOSIS — S39012A Strain of muscle, fascia and tendon of lower back, initial encounter: Secondary | ICD-10-CM | POA: Diagnosis not present

## 2014-11-03 DIAGNOSIS — Z8719 Personal history of other diseases of the digestive system: Secondary | ICD-10-CM | POA: Diagnosis not present

## 2014-11-03 DIAGNOSIS — S3992XA Unspecified injury of lower back, initial encounter: Secondary | ICD-10-CM | POA: Diagnosis present

## 2014-11-03 DIAGNOSIS — Y9389 Activity, other specified: Secondary | ICD-10-CM | POA: Diagnosis not present

## 2014-11-03 DIAGNOSIS — Y9289 Other specified places as the place of occurrence of the external cause: Secondary | ICD-10-CM | POA: Insufficient documentation

## 2014-11-03 DIAGNOSIS — Y998 Other external cause status: Secondary | ICD-10-CM | POA: Insufficient documentation

## 2014-11-03 DIAGNOSIS — Z720 Tobacco use: Secondary | ICD-10-CM | POA: Diagnosis not present

## 2014-11-03 DIAGNOSIS — Z8742 Personal history of other diseases of the female genital tract: Secondary | ICD-10-CM | POA: Diagnosis not present

## 2014-11-03 DIAGNOSIS — Z79899 Other long term (current) drug therapy: Secondary | ICD-10-CM | POA: Diagnosis not present

## 2014-11-03 DIAGNOSIS — Z792 Long term (current) use of antibiotics: Secondary | ICD-10-CM | POA: Insufficient documentation

## 2014-11-03 DIAGNOSIS — W240XXA Contact with lifting devices, not elsewhere classified, initial encounter: Secondary | ICD-10-CM | POA: Insufficient documentation

## 2014-11-03 DIAGNOSIS — Z91040 Latex allergy status: Secondary | ICD-10-CM | POA: Insufficient documentation

## 2014-11-03 MED ORDER — HYDROCODONE-ACETAMINOPHEN 5-325 MG PO TABS: ORAL_TABLET | ORAL | Status: DC

## 2014-11-03 MED ORDER — CYCLOBENZAPRINE HCL 10 MG PO TABS: 10.0000 mg | ORAL_TABLET | Freq: Three times a day (TID) | ORAL | Status: DC | PRN

## 2014-11-03 NOTE — ED Notes (Signed)
Pt states that she has been having muscle spasms in lower back for the past few days.  States that she has been moving and has been doing a lot of heavy lifting.

## 2014-11-03 NOTE — ED Notes (Signed)
Pt verbalized understanding of no driving and to use caution within 4 hours of taking pain meds due to meds cause drowsiness 

## 2014-11-03 NOTE — Discharge Instructions (Signed)

## 2014-11-06 NOTE — ED Provider Notes (Signed)
CSN: [id number]     Arrival date & time 11/03/14  1018 History   First MD Initiated Contact with Patient 11/03/14 1057     Chief Complaint  Patient presents with  . Back Pain     (Consider location/radiation/quality/duration/timing/severity/associated sxs/prior Treatment) HPI   Joyce Ward is a 29 y.o. female who presents to the Emergency Department complaining of low back pain for 1-2 days.  She states the pain began gradually after moving and lifting heavy boxes.  She describes an aching pain across her lower back.  Pain is worse with bending and twisting.  She denies abd pain, fever, chills, numbness or weakness of the LE's, urine or bowel changes.   Past Medical History  Diagnosis Date  . Endometriosis   . GERD (gastroesophageal reflux disease)   . Pelvic pain in female 08/19/2014  . History of endometriosis 08/19/2014  . Irregular intermenstrual bleeding 08/19/2014   Past Surgical History  Procedure Laterality Date  . Laparoscopy     Family History  Problem Relation Age of Onset  . Endometriosis Mother    Social History  Substance Use Topics  . Smoking status: Current Some Day Smoker -- 1.00 packs/day for 5 years    Types: Cigarettes  . Smokeless tobacco: Never Used  . Alcohol Use: Yes     Comment: occasional   OB History    Gravida Para Term Preterm AB TAB SAB Ectopic Multiple Living   Review of Systems  Constitutional: Negative for fever.  Respiratory: Negative for shortness of breath.   Gastrointestinal: Negative for vomiting, abdominal pain and constipation.  Genitourinary: Negative for dysuria, hematuria, flank pain, decreased urine volume and difficulty urinating.  Musculoskeletal: Positive for back pain. Negative for joint swelling.  Skin: Negative for rash.  Neurological: Negative for weakness and numbness.  All other systems reviewed and are negative.     Allergies  Codeine; Latex; Naproxen; Toradol; and Tramadol  Home  Medications   Prior to Admission medications   Medication Sig Start Date End Date Taking? Authorizing Provider  albuterol (PROVENTIL HFA;VENTOLIN HFA) 108 (90 BASE) MCG/ACT inhaler Inhale 1-2 puffs into the lungs every 6 (six) hours as needed for wheezing or shortness of breath. 10/22/14   Ivery Quale, PA-C  amoxicillin (AMOXIL) 500 MG capsule Take 1 capsule (500 mg total) by mouth 3 (three) times daily. 10/21/14   Ivery Quale, PA-C  cyclobenzaprine (FLEXERIL) 10 MG tablet Take 1 tablet (10 mg total) by mouth 3 (three) times daily as needed. 11/03/14   Emberlie Gotcher, PA-C  HYDROcodone-acetaminophen (NORCO/VICODIN) 5-325 MG tablet Take one-two tabs po q 4-6 hrs prn pain 11/03/14   Kris No, PA-C  HYDROcodone-homatropine (HYCODAN) 5-1.5 MG/5ML syrup Take 5 mLs by mouth every 6 (six) hours as needed. 10/21/14   Ivery Quale, PA-C  loratadine-pseudoephedrine (CLARITIN-D 12 HOUR) 5-120 MG per tablet Take 1 tablet by mouth 2 (two) times daily. 10/21/14   Ivery Quale, PA-C  predniSONE (DELTASONE) 10 MG tablet 5,4,3,2,1 - take with food 10/21/14   Ivery Quale, PA-C   BP 113/79 mmHg  Pulse 90  Temp(Src) 97.9 F (36.6 C) (Oral)  Resp 16  Ht  (1.6 m)  Wt 119 lb (53.978 kg)  BMI 21.09 kg/m2  SpO2 99%  LMP 09/30/2014 Physical Exam  Constitutional: She is oriented to person, place, and time. She appears well-developed and well-nourished. No distress.  HENT:  Head: Normocephalic and atraumatic.  Neck: Normal range of motion. Neck supple.  Cardiovascular: Normal rate, regular rhythm, normal heart sounds and intact distal pulses.   No murmur heard. Pulmonary/Chest: Effort normal and breath sounds normal. No respiratory distress.  Abdominal: Soft. She exhibits no distension. There is no tenderness.  Musculoskeletal: She exhibits tenderness. She exhibits no edema.       Lumbar back: She exhibits tenderness and pain. She exhibits normal range of motion, no swelling, no deformity, no  laceration and normal pulse.  ttp of the bilateral lumbar paraspinal muscles.  No spinal tenderness.  DP pulses are brisk and symmetrical.  Distal sensation intact.  Hip Flexors/Extensors are intact.  Pt has 5/5 strength against resistance of bilateral lower extremities.     Neurological: She is alert and oriented to person, place, and time. She has normal strength. No sensory deficit. She exhibits normal muscle tone. Coordination and gait normal.  Reflex Scores:      Patellar reflexes are 2+ on the right side and 2+ on the left side.      Achilles reflexes are 2+ on the right side and 2+ on the left side. Skin: Skin is warm and dry. No rash noted.  Nursing note and vitals reviewed.   ED Course  Procedures (including critical care time) Labs Review Labs Reviewed - No data to display  Imaging Review No results found. I have personally reviewed and evaluated these images and lab results as part of my medical decision-making.   EKG Interpretation None      MDM   Final diagnoses:  Lumbar strain, initial encounter    Pt is well appearing.  Ambulates with a steady gait, no focal neuro deficits.  Likely strain.  No concerning sx's for emergent neurological or infectious process.  Agrees to symptomatic tx and PMD f/u if not improving.  Appears stable for d/c    Pauline Ausammy Maxie Slovacek, PA-C 11/06/14 2117  Bethann BerkshireJoseph Zammit, MD 11/07/14 1535

## 2014-11-13 ENCOUNTER — Emergency Department (HOSPITAL_COMMUNITY)
Admission: EM | Admit: 2014-11-13 | Discharge: 2014-11-14 | Disposition: A | Discharge status: Home / Self Care | Payer: Medicaid Other | Attending: Emergency Medicine | Admitting: Emergency Medicine

## 2014-11-13 ENCOUNTER — Encounter (HOSPITAL_COMMUNITY): Payer: Self-pay | Admitting: Emergency Medicine

## 2014-11-13 DIAGNOSIS — R000 Tachycardia, unspecified: Secondary | ICD-10-CM | POA: Insufficient documentation

## 2014-11-13 DIAGNOSIS — N939 Abnormal uterine and vaginal bleeding, unspecified: Secondary | ICD-10-CM

## 2014-11-13 DIAGNOSIS — Z91040 Latex allergy status: Secondary | ICD-10-CM | POA: Diagnosis not present

## 2014-11-13 DIAGNOSIS — Z79899 Other long term (current) drug therapy: Secondary | ICD-10-CM | POA: Diagnosis not present

## 2014-11-13 DIAGNOSIS — N730 Acute parametritis and pelvic cellulitis: Secondary | ICD-10-CM

## 2014-11-13 DIAGNOSIS — Z720 Tobacco use: Secondary | ICD-10-CM | POA: Insufficient documentation

## 2014-11-13 DIAGNOSIS — Z8719 Personal history of other diseases of the digestive system: Secondary | ICD-10-CM | POA: Diagnosis not present

## 2014-11-13 LAB — WET PREP, GENITAL
TRICH WET PREP: NONE SEEN
YEAST WET PREP: NONE SEEN

## 2014-11-13 LAB — I-STAT CHEM 8, ED
BUN: 3 mg/dL — AB (ref 6–20)
CALCIUM ION: 1.15 mmol/L (ref 1.12–1.23)
Chloride: 107 mmol/L (ref 101–111)
Creatinine, Ser: 1 mg/dL (ref 0.44–1.00)
Glucose, Bld: 91 mg/dL (ref 65–99)
HEMATOCRIT: 40 % (ref 36.0–46.0)
HEMOGLOBIN: 13.6 g/dL (ref 12.0–15.0)
Potassium: 3.6 mmol/L (ref 3.5–5.1)
SODIUM: 145 mmol/L (ref 135–145)
TCO2: 24 mmol/L (ref 0–100)

## 2014-11-13 LAB — I-STAT BETA HCG BLOOD, ED (MC, WL, AP ONLY)

## 2014-11-13 MED ORDER — CEFTRIAXONE SODIUM 250 MG IJ SOLR
250.0000 mg | Freq: Once | INTRAMUSCULAR | Status: AC
Start: 2014-11-13 — End: 2014-11-13
  Administered 2014-11-13: 250 mg via INTRAMUSCULAR
  Filled 2014-11-13: qty 250

## 2014-11-13 MED ORDER — LIDOCAINE HCL (PF) 1 % IJ SOLN
INTRAMUSCULAR | Status: AC
  Filled 2014-11-13: qty 5

## 2014-11-13 MED ORDER — HYDROCODONE-ACETAMINOPHEN 5-325 MG PO TABS: 2.0000 | ORAL_TABLET | ORAL | Status: DC | PRN

## 2014-11-13 MED ORDER — AZITHROMYCIN 250 MG PO TABS
1000.0000 mg | ORAL_TABLET | Freq: Once | ORAL | Status: AC
  Administered 2014-11-13: 1000 mg via ORAL
  Filled 2014-11-13: qty 4

## 2014-11-13 MED ORDER — IBUPROFEN 400 MG PO TABS
600.0000 mg | ORAL_TABLET | Freq: Once | ORAL | Status: AC
  Administered 2014-11-13: 600 mg via ORAL
  Filled 2014-11-13: qty 2

## 2014-11-13 MED ORDER — METRONIDAZOLE 500 MG PO TABS: 500.0000 mg | ORAL_TABLET | Freq: Two times a day (BID) | ORAL | Status: DC

## 2014-11-13 NOTE — ED Provider Notes (Signed)
CSN: [id number]     Arrival date & time 11/13/14  2150 History   First MD Initiated Contact with Patient 11/13/14 2153     Chief Complaint  Patient presents with  . Vaginal Bleeding     (Consider location/radiation/quality/duration/timing/severity/associated sxs/prior Treatment) Patient is a 29 y.o. female presenting with vaginal bleeding. The history is provided by the patient.  Vaginal Bleeding Quality:  Bright red and clots Severity:  Moderate Onset quality:  Gradual Duration:  5 days Timing:  Intermittent Progression:  Worsening Chronicity:  New Possible pregnancy: no   Context: spontaneously   Worsened by:  Nothing tried Ineffective treatments:  None tried Associated symptoms: abdominal pain    Joyce Ward is a [id number] who presents to the ED with vaginal bleeding that started 5 days ago. The bleeding is similar to her usual periods but she is having more pain than usual.   Past Medical History  Diagnosis Date  . Endometriosis   . GERD (gastroesophageal reflux disease)   . Pelvic pain in female 08/19/2014  . History of endometriosis 08/19/2014  . Irregular intermenstrual bleeding 08/19/2014   Past Surgical History  Procedure Laterality Date  . Laparoscopy     Family History  Problem Relation Age of Onset  . Endometriosis Mother    Social History  Substance Use Topics  . Smoking status: Current Some Day Smoker -- 1.00 packs/day for 5 years    Types: Cigarettes  . Smokeless tobacco: Never Used  . Alcohol Use: Yes     Comment: occasional   OB History    Gravida Para Term Preterm AB TAB SAB Ectopic Multiple Living   Review of Systems  Gastrointestinal: Positive for abdominal pain.  Genitourinary: Positive for vaginal bleeding and pelvic pain.  all other systems negative    Allergies  Codeine; Latex; Naproxen; Toradol; and Tramadol  Home Medications   Prior to Admission medications   Medication Sig Start Date End Date Taking?  Authorizing Provider  ibuprofen (ADVIL,MOTRIN) 200 MG tablet Take 600 mg by mouth every 8 (eight) hours as needed for mild pain.   Yes Historical Provider, MD  albuterol (PROVENTIL HFA;VENTOLIN HFA) 108 (90 BASE) MCG/ACT inhaler Inhale 1-2 puffs into the lungs every 6 (six) hours as needed for wheezing or shortness of breath. 10/22/14   Ivery Quale, PA-C  HYDROcodone-acetaminophen (NORCO/VICODIN) 5-325 MG tablet Take 2 tablets by mouth every 4 (four) hours as needed. 11/13/14   Hope Orlene Och, NP  metroNIDAZOLE (FLAGYL) 500 MG tablet Take 1 tablet (500 mg total) by mouth 2 (two) times daily. 11/13/14   Hope Orlene Och, NP   BP 109/87 mmHg  Pulse 118  Temp(Src) 98.6 F (37 C) (Oral)  Resp 14  Ht  (1.6 m)  Wt 119 lb (53.978 kg)  BMI 21.09 kg/m2  SpO2 100%  LMP 10/10/2014 Physical Exam  Constitutional: She is oriented to person, place, and time. She appears well-developed and well-nourished.  HENT:  Head: Normocephalic and atraumatic.  Eyes: EOM are normal.  Neck: Neck supple.  Cardiovascular: Tachycardia present.   Pulmonary/Chest: Effort normal.  Abdominal: Soft. Bowel sounds are normal. There is tenderness. There is no rebound and no guarding.  Tender with palpation lower abdomen  Genitourinary:  External genitalia without lesions, bloody frothy malodorous d/c vaginal vault, positive CMT, bilateral adnexal tenderness left >right. Uterus without palpable enlargement.   Musculoskeletal: Normal range of motion.  Neurological: She is  alert and oriented to person, place, and time. No cranial nerve deficit.  Skin: Skin is warm and dry.  Psychiatric: She has a normal mood and affect. Her behavior is normal.  Nursing note and vitals reviewed.   ED Course  Procedures (including critical care time) Labs Review Results for orders placed or performed during the hospital encounter of 11/13/14 (from the past 24 hour(s))  I-Stat Beta hCG blood, ED ([facility], [facility], [facility] only)     Status: None    Collection Time: 11/13/14 10:29 PM  Result Value Ref Range   I-stat hCG, quantitative <5.0 <5 mIU/mL   Comment 3          I-Stat Chem 8, ED     Status: Abnormal   Collection Time: 11/13/14 10:30 PM  Result Value Ref Range   Sodium 145 135 - 145 mmol/L   Potassium 3.6 3.5 - 5.1 mmol/L   Chloride 107 101 - 111 mmol/L   BUN 3 (L) 6 - 20 mg/dL   Creatinine, Ser 1.611.00 0.44 - 1.00 mg/dL   Glucose, Bld 91 65 - 99 mg/dL   Calcium, Ion 0.961.15 0.451.12 - 1.23 mmol/L   TCO2 24 0 - 100 mmol/L   Hemoglobin 13.6 12.0 - 15.0 g/dL   HCT 40.940.0 81.136.0 - 91.446.0 %  Wet prep, genital     Status: Abnormal   Collection Time: 11/13/14 10:44 PM  Result Value Ref Range   Yeast Wet Prep HPF POC NONE SEEN NONE SEEN   Trich, Wet Prep NONE SEEN NONE SEEN   Clue Cells Wet Prep HPF POC MANY (A) NONE SEEN   WBC, Wet Prep HPF POC FEW (A) NONE SEEN     MDM  29 y.o. female with pelvic pain and vaginal d/c stable for d/c without acute abdomen, fever or n/v. Will treat for BV and PID out patient. Rocephin 250 mg IM, Zithromax 1 gram PO, Rx for Flagyl and pain management. She will call [facility] for follow up.   Final diagnoses:  PID (acute pelvic inflammatory disease)  Abnormal vaginal bleeding     Janne NapoleonHope M Neese, NP 11/14/14 1607  Alvira MondayErin Schlossman, MD 11/15/14 1455

## 2014-11-13 NOTE — Discharge Instructions (Signed)
[facility]  Abnormal Uterine Bleeding Abnormal uterine bleeding means bleeding from the vagina that is not your normal menstrual period. This can be:  Bleeding or spotting between periods.  Bleeding after sex (sexual intercourse).  Bleeding that is heavier or more than normal.  Periods that last longer than usual.  Bleeding after menopause. There are many problems that may cause this. Treatment will depend on the cause of the bleeding. Any kind of bleeding that is not normal should be reviewed by your doctor.  HOME CARE Watch your condition for any changes. These actions may lessen any discomfort you are having:  Do not use tampons or douches as told by your doctor.  Change your pads often. You should get regular pelvic exams and Pap tests. Keep all appointments for tests as told by your doctor. GET HELP IF:  You are bleeding for more than 1 week.  You feel dizzy at times. GET HELP RIGHT AWAY IF:   You pass out.  You have to change pads every 15 to 30 minutes.  You have belly pain.  You have a fever.  You become sweaty or weak.  You are passing large blood clots from the vagina.  You feel sick to your stomach (nauseous) and throw up (vomit). MAKE SURE YOU:  Understand these instructions.  Will watch your condition.  Will get help right away if you are not doing well or get worse.   This information is not intended to replace advice given to you by your health care provider. Make sure you discuss any questions you have with your health care provider.   Document Released: 11/08/2008 Document Revised: 01/16/2013 Document Reviewed: 08/10/2012 Elsevier Interactive Patient Education [employer]2016 Elsevier Inc.

## 2014-11-13 NOTE — ED Notes (Signed)
Patient complaining of vaginal bleeding since Oct 14, states she is passing clots today. Also complaining of lower abdominal pain x 2 days.

## 2014-11-15 LAB — GC/CHLAMYDIA PROBE AMP (~~LOC~~) NOT AT ARMC
Chlamydia: NEGATIVE
NEISSERIA GONORRHEA: NEGATIVE

## 2014-11-20 ENCOUNTER — Emergency Department (HOSPITAL_COMMUNITY)
Admission: EM | Admit: 2014-11-20 | Discharge: 2014-11-20 | Disposition: A | Discharge status: Home / Self Care | Payer: Medicaid Other | Attending: Emergency Medicine | Admitting: Emergency Medicine

## 2014-11-20 ENCOUNTER — Encounter (HOSPITAL_COMMUNITY): Payer: Self-pay | Admitting: Emergency Medicine

## 2014-11-20 DIAGNOSIS — Z91040 Latex allergy status: Secondary | ICD-10-CM | POA: Diagnosis not present

## 2014-11-20 DIAGNOSIS — Y9389 Activity, other specified: Secondary | ICD-10-CM | POA: Diagnosis not present

## 2014-11-20 DIAGNOSIS — Z79899 Other long term (current) drug therapy: Secondary | ICD-10-CM | POA: Diagnosis not present

## 2014-11-20 DIAGNOSIS — Z792 Long term (current) use of antibiotics: Secondary | ICD-10-CM | POA: Insufficient documentation

## 2014-11-20 DIAGNOSIS — Y998 Other external cause status: Secondary | ICD-10-CM | POA: Insufficient documentation

## 2014-11-20 DIAGNOSIS — Z720 Tobacco use: Secondary | ICD-10-CM | POA: Insufficient documentation

## 2014-11-20 DIAGNOSIS — Y9289 Other specified places as the place of occurrence of the external cause: Secondary | ICD-10-CM | POA: Insufficient documentation

## 2014-11-20 DIAGNOSIS — X58XXXA Exposure to other specified factors, initial encounter: Secondary | ICD-10-CM | POA: Insufficient documentation

## 2014-11-20 DIAGNOSIS — Z8719 Personal history of other diseases of the digestive system: Secondary | ICD-10-CM | POA: Insufficient documentation

## 2014-11-20 DIAGNOSIS — T148XXA Other injury of unspecified body region, initial encounter: Secondary | ICD-10-CM

## 2014-11-20 DIAGNOSIS — Z8742 Personal history of other diseases of the female genital tract: Secondary | ICD-10-CM | POA: Insufficient documentation

## 2014-11-20 DIAGNOSIS — S29012A Strain of muscle and tendon of back wall of thorax, initial encounter: Secondary | ICD-10-CM | POA: Insufficient documentation

## 2014-11-20 DIAGNOSIS — S29009A Unspecified injury of muscle and tendon of unspecified wall of thorax, initial encounter: Secondary | ICD-10-CM | POA: Diagnosis present

## 2014-11-20 MED ORDER — METHOCARBAMOL 500 MG PO TABS: 500.0000 mg | ORAL_TABLET | Freq: Two times a day (BID) | ORAL | Status: DC

## 2014-11-20 NOTE — Discharge Instructions (Signed)

## 2014-11-20 NOTE — ED Provider Notes (Signed)
CSN: [id number]     Arrival date & time 11/20/14  1148 History   First MD Initiated Contact with Patient 11/20/14 1156     Chief Complaint  Patient presents with  . Back Pain     (Consider location/radiation/quality/duration/timing/severity/associated sxs/prior Treatment) Patient is a 29 y.o. female presenting with back pain. The history is provided by the patient. No language interpreter was used.  Back Pain Location:  Thoracic spine Quality:  Aching Radiates to:  Does not radiate Pain severity:  Moderate Pain is:  Same all the time Onset quality:  Sudden Duration:  2 days Timing:  Constant Progression:  Worsening Chronicity:  New Context: lifting heavy objects   Relieved by:  Nothing Worsened by:  Nothing tried Associated symptoms: no numbness, no paresthesias and no tingling   Risk factors: not pregnant   Pt began having back pain after lifting boxes full of liquor  Past Medical History  Diagnosis Date  . Endometriosis   . GERD (gastroesophageal reflux disease)   . Pelvic pain in female 08/19/2014  . History of endometriosis 08/19/2014  . Irregular intermenstrual bleeding 08/19/2014   Past Surgical History  Procedure Laterality Date  . Laparoscopy     Family History  Problem Relation Age of Onset  . Endometriosis Mother    Social History  Substance Use Topics  . Smoking status: Current Some Day Smoker -- 1.00 packs/day for 5 years    Types: Cigarettes  . Smokeless tobacco: Never Used  . Alcohol Use: Yes     Comment: occasional   OB History    Gravida Para Term Preterm AB TAB SAB Ectopic Multiple Living   3 2   1  1   2      Review of Systems  Musculoskeletal: Positive for back pain.  Neurological: Negative for tingling, numbness and paresthesias.  All other systems reviewed and are negative.     Allergies  Codeine; Latex; Naproxen; Toradol; and Tramadol  Home Medications   Prior to Admission medications   Medication Sig Start Date End Date  Taking? Authorizing Provider  albuterol (PROVENTIL HFA;VENTOLIN HFA) 108 (90 BASE) MCG/ACT inhaler Inhale 1-2 puffs into the lungs every 6 (six) hours as needed for wheezing or shortness of breath. 10/22/14   Ivery QualeHobson Bryant, PA-C  HYDROcodone-acetaminophen (NORCO/VICODIN) 5-325 MG tablet Take 2 tablets by mouth every 4 (four) hours as needed. 11/13/14   Hope Orlene OchM Neese, NP  ibuprofen (ADVIL,MOTRIN) 200 MG tablet Take 600 mg by mouth every 8 (eight) hours as needed for mild pain.    Historical Provider, MD  methocarbamol (ROBAXIN) 500 MG tablet Take 1 tablet (500 mg total) by mouth 2 (two) times daily. 11/20/14   Elson AreasLeslie K Maleiah Dula, PA-C  metroNIDAZOLE (FLAGYL) 500 MG tablet Take 1 tablet (500 mg total) by mouth 2 (two) times daily. 11/13/14   Hope Orlene OchM Neese, NP   BP 112/82 mmHg  Pulse 110  Temp(Src) 98.2 F (36.8 C) (Oral)  Resp 16  Ht 5\' 2"  (1.575 m)  Wt 119 lb (53.978 kg)  BMI 21.76 kg/m2  SpO2 100%  LMP 10/10/2014 (LMP Unknown) Physical Exam  Constitutional: She is oriented to person, place, and time. She appears well-developed and well-nourished.  HENT:  Head: Normocephalic.  Right Ear: External ear normal.  Eyes: EOM are normal. Pupils are equal, round, and reactive to light.  Neck: Normal range of motion.  Cardiovascular: Normal rate.   Pulmonary/Chest: Effort normal.  Abdominal: She exhibits no distension.  Musculoskeletal:  Tender thoracic spine  Neurological: She is alert and oriented to person, place, and time.  Psychiatric: She has a normal mood and affect.  Nursing note and vitals reviewed.   ED Course  Procedures (including critical care time) Labs Review Labs Reviewed - No data to display  Imaging Review No results found. I have personally reviewed and evaluated these images and lab results as part of my medical decision-making.   EKG Interpretation None      MDM   Final diagnoses:  Muscle strain    Robaxin avs    Elson Areas, PA-C 11/20/14  1454  Raeford Razor, MD 11/21/14 [phone]

## 2014-11-20 NOTE — ED Notes (Signed)
Pt states that she has been having bad back pain after doing a lot of lifting for the past few days.

## 2014-12-02 MED FILL — Hydrocodone-Acetaminophen Tab 5-325 MG: ORAL | Qty: 6 | Status: AC

## 2014-12-06 ENCOUNTER — Ambulatory Visit: Payer: Medicaid Other | Admitting: Obstetrics and Gynecology

## 2014-12-09 ENCOUNTER — Encounter: Payer: Self-pay | Admitting: Obstetrics and Gynecology

## 2014-12-09 ENCOUNTER — Ambulatory Visit (INDEPENDENT_AMBULATORY_CARE_PROVIDER_SITE_OTHER): Payer: Medicaid Other | Admitting: Obstetrics and Gynecology

## 2014-12-09 VITALS — BP 90/60 | Ht 63.0 in | Wt 117.0 lb

## 2014-12-09 DIAGNOSIS — N946 Dysmenorrhea, unspecified: Secondary | ICD-10-CM

## 2014-12-09 DIAGNOSIS — R102 Pelvic and perineal pain: Secondary | ICD-10-CM

## 2014-12-09 DIAGNOSIS — Z8742 Personal history of other diseases of the female genital tract: Secondary | ICD-10-CM

## 2014-12-09 HISTORY — DX: Dysmenorrhea, unspecified: N94.6

## 2014-12-09 MED ORDER — NORETHIN ACE-ETH ESTRAD-FE 1-20 MG-MCG PO TABS: 1.0000 | ORAL_TABLET | Freq: Every day | ORAL | Status: DC

## 2014-12-09 MED ORDER — OXYCODONE-ACETAMINOPHEN 5-325 MG PO TABS: 1.0000 | ORAL_TABLET | ORAL | Status: DC | PRN

## 2014-12-09 NOTE — Progress Notes (Signed)
Patient ID: Joyce Quincennalee Surges, female   DOB: 03/08/1985, 29 y.o.   MRN: [id number]    [facility]  Patient name: Joyce Ward Ilyas MRN [id number]  Date of birth: 11/24/1985  CC & HPI:  Joyce Ward Morelos is a 29 y.o. female for multiple menses each month and is currently menstruating. She states increased mood swings and cramping pain as associated symptoms.    She is here with a man who is allegedly her father , a Sales executivemilitary veteran, who reportedly manages her medicines. When seen last time, she was to supply us with the phone number to hospital that did her 2 laparoscopies for pelvic pain, diagnosing endometriosis, and that number was not supplied.  I obtained it via [employer] today.   Pt was seen in the office on 9/6 for vaginal discharge, was diagnosed with trichomoniasis and prescribed in the ED. She was seen in the ED on 10/19 for abnormal vaginal bleeding and was prescribed a second course of Flagyl. Pt had GC/Chlamydia on both dates which were negative.  ROS:  A complete 10 system review of systems was obtained and all systems are negative except as noted in the HPI and PMH.   Pertinent History Reviewed:   Reviewed: Significant for endometriosis Medical         Past Medical History  Diagnosis Date  . Endometriosis   . GERD (gastroesophageal reflux disease)   . Pelvic pain in female 08/19/2014  . History of endometriosis 08/19/2014  . Irregular intermenstrual bleeding 08/19/2014                              Surgical Hx:    Past Surgical History  Procedure Laterality Date  . Laparoscopy     Medications: Reviewed & Updated - see associated section                       Current outpatient prescriptions:  .  ibuprofen (ADVIL,MOTRIN) 200 MG tablet, Take 600 mg by mouth every 8 (eight) hours as needed for mild pain., Disp: , Rfl:  .  albuterol (PROVENTIL HFA;VENTOLIN HFA) 108 (90 BASE) MCG/ACT inhaler, Inhale 1-2 puffs into the lungs every 6 (six) hours as needed for wheezing  or shortness of breath. (Patient not taking: Reported on 12/09/2014), Disp: 1 Inhaler, Rfl: 0   Social History: Reviewed -  reports that she has been smoking Cigarettes.  She has a 5 pack-year smoking history. She has never used smokeless tobacco.  Objective Findings:  Vitals: Blood pressure 90/60, height 5\' 3"  (1.6 m), weight 117 lb (53.071 kg), last menstrual period 12/08/2014.  Physical Examination: General appearance - alert, well appearing, and in no distress, oriented to person, place, and time and normal appearing weight Mental status - alert, oriented to person, place, and time, normal mood, behavior, speech, dress, motor activity, and thought processes  Discussed with pt . Pt had opportunity to ask questions and has no further questions at this time.  Greater than 50% was spent in counseling and coordination of care with the patient. Discussion time: 25 mins  Assessment & Plan:   A:  1. Dysmenorrhea 2 endometriosis by patient history 3. 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 Will give a limited # of percocet to last thru this current menses, and pt to continue ibuprofen at same time. 2. Will seek records of prior surgeries. 3 f/u 2 wk 4. Will  review record with pt,  5  Begin continuous ocp to minimize pain of alleged endometriosis.   By signing my name below, I, Gwenyth Ober, attest that this documentation has been prepared under the direction and in the presence of Tilda Burrow, MD.  Electronically Signed: Gwenyth Ober, ED Scribe. 12/09/2014. 12:30 PM.  I personally performed the services described in this documentation, which was SCRIBED in my presence. The recorded information has been reviewed and considered accurate. It has been edited as necessary during review. Tilda Burrow, MD

## 2014-12-09 NOTE — Progress Notes (Signed)
Patient ID: Joyce Ward, female   DOB: 02/19/1985, 29 y.o.   MRN: [id number] Pt here today for a refill on her ibuprofen and to discuss problems she is still having with her BCP.

## 2014-12-11 ENCOUNTER — Emergency Department (HOSPITAL_COMMUNITY)
Admission: EM | Admit: 2014-12-11 | Discharge: 2014-12-11 | Disposition: A | Discharge status: Home / Self Care | Payer: Medicaid Other | Attending: Emergency Medicine | Admitting: Emergency Medicine

## 2014-12-11 ENCOUNTER — Encounter (HOSPITAL_COMMUNITY): Payer: Self-pay | Admitting: Emergency Medicine

## 2014-12-11 DIAGNOSIS — Z8742 Personal history of other diseases of the female genital tract: Secondary | ICD-10-CM | POA: Insufficient documentation

## 2014-12-11 DIAGNOSIS — R05 Cough: Secondary | ICD-10-CM | POA: Diagnosis present

## 2014-12-11 DIAGNOSIS — J209 Acute bronchitis, unspecified: Secondary | ICD-10-CM | POA: Diagnosis not present

## 2014-12-11 DIAGNOSIS — Z79899 Other long term (current) drug therapy: Secondary | ICD-10-CM | POA: Diagnosis not present

## 2014-12-11 DIAGNOSIS — Z8719 Personal history of other diseases of the digestive system: Secondary | ICD-10-CM | POA: Diagnosis not present

## 2014-12-11 DIAGNOSIS — F17210 Nicotine dependence, cigarettes, uncomplicated: Secondary | ICD-10-CM | POA: Diagnosis not present

## 2014-12-11 DIAGNOSIS — Z793 Long term (current) use of hormonal contraceptives: Secondary | ICD-10-CM | POA: Insufficient documentation

## 2014-12-11 DIAGNOSIS — Z91040 Latex allergy status: Secondary | ICD-10-CM | POA: Diagnosis not present

## 2014-12-11 DIAGNOSIS — J40 Bronchitis, not specified as acute or chronic: Secondary | ICD-10-CM

## 2014-12-11 MED ORDER — AZITHROMYCIN 250 MG PO TABS: 250.0000 mg | ORAL_TABLET | Freq: Every day | ORAL | Status: DC

## 2014-12-11 MED ORDER — BENZONATATE 100 MG PO CAPS: 200.0000 mg | ORAL_CAPSULE | Freq: Three times a day (TID) | ORAL | Status: DC | PRN

## 2014-12-11 MED ORDER — BENZONATATE 100 MG PO CAPS
200.0000 mg | ORAL_CAPSULE | Freq: Once | ORAL | Status: AC
  Administered 2014-12-11: 200 mg via ORAL
  Filled 2014-12-11: qty 2

## 2014-12-11 NOTE — ED Notes (Signed)
Patient complains of chest congestion with sore through; also sinus congestion and drainage for 2 weeks.

## 2014-12-14 NOTE — ED Provider Notes (Signed)
CSN: [id number]     Arrival date & time 12/11/14  2056 History   First MD Initiated Contact with Patient 12/11/14 2117     Chief Complaint  Patient presents with  . URI     (Consider location/radiation/quality/duration/timing/severity/associated sxs/prior Treatment) HPI  Quillian QuinceOnnalee Hechler is a 29 y.o. female who presents to the Emergency Department complaining of cough, sinus pressure and nasal congestion for two weeks.  Cough has been productive of yellow sputum.  She also notes sinus pressure and facial pain.  She also complains of sore throat for few days.  She has tried OTC cold medications without relief.  She denies shortness of breath, fever, chest pain, rash, nausea/vomiting.   Past Medical History  Diagnosis Date  . Endometriosis   . GERD (gastroesophageal reflux disease)   . Pelvic pain in female 08/19/2014  . History of endometriosis 08/19/2014  . Irregular intermenstrual bleeding 08/19/2014   Past Surgical History  Procedure Laterality Date  . Laparoscopy     Family History  Problem Relation Age of Onset  . Endometriosis Mother    Social History  Substance Use Topics  . Smoking status: Current Some Day Smoker -- 1.00 packs/day for 5 years    Types: Cigarettes  . Smokeless tobacco: Never Used  . Alcohol Use: Yes     Comment: occasional   OB History    Gravida Para Term Preterm AB TAB SAB Ectopic Multiple Living   3 2   1  1   2      Review of Systems  Constitutional: Negative for fever, chills and appetite change.  HENT: Positive for congestion, rhinorrhea and sinus pressure. Negative for sore throat and trouble swallowing.   Respiratory: Positive for cough and chest tightness. Negative for shortness of breath and wheezing.   Cardiovascular: Negative for chest pain.  Gastrointestinal: Negative for nausea, vomiting and abdominal pain.  Genitourinary: Negative for dysuria.  Musculoskeletal: Negative for arthralgias.  Skin: Negative for rash.  Neurological:  Negative for dizziness, weakness and numbness.  Hematological: Negative for adenopathy.  All other systems reviewed and are negative.     Allergies  Codeine; Latex; Naproxen; Toradol; and Tramadol  Home Medications   Prior to Admission medications   Medication Sig Start Date End Date Taking? Authorizing Provider  albuterol (PROVENTIL HFA;VENTOLIN HFA) 108 (90 BASE) MCG/ACT inhaler Inhale 1-2 puffs into the lungs every 6 (six) hours as needed for wheezing or shortness of breath. Patient not taking: Reported on 12/09/2014 10/22/14   Ivery QualeHobson Bryant, PA-C  azithromycin (ZITHROMAX) 250 MG tablet Take 1 tablet (250 mg total) by mouth daily. Take first 2 tablets together, then 1 every day until finished. 12/11/14   Diontre Harps, PA-C  benzonatate (TESSALON) 100 MG capsule Take 2 capsules (200 mg total) by mouth 3 (three) times daily as needed for cough. Swallow whole do not chew 12/11/14   Tyreonna Czaplicki, PA-C  ibuprofen (ADVIL,MOTRIN) 200 MG tablet Take 600 mg by mouth every 8 (eight) hours as needed for mild pain.    Historical Provider, MD  norethindrone-ethinyl estradiol (JUNEL FE 1/20) 1-20 MG-MCG tablet Take 1 tablet by mouth daily. 12/09/14   Tilda BurrowJohn Ferguson V, MD  oxyCODONE-acetaminophen (PERCOCET/ROXICET) 5-325 MG tablet Take 1-2 tablets by mouth every 4 (four) hours as needed for moderate pain or severe pain. 12/09/14   Tilda BurrowJohn Ferguson V, MD   BP 98/61 mmHg  Pulse 93  Temp(Src) 98.4 F (36.9 C) (Oral)  Resp 18  Ht 5\' 2"  (1.575 m)  Wt  117 lb (53.071 kg)  BMI 21.39 kg/m2  SpO2 98%  LMP 12/11/2014 Physical Exam  Constitutional: She is oriented to person, place, and time. She appears well-developed and well-nourished. No distress.  HENT:  Head: Normocephalic and atraumatic.  Right Ear: Tympanic membrane and ear canal normal.  Left Ear: Tympanic membrane and ear canal normal.  Nose: Mucosal edema present.  Mouth/Throat: Uvula is midline, oropharynx is clear and moist and mucous  membranes are normal. No oropharyngeal exudate.  Eyes: EOM are normal. Pupils are equal, round, and reactive to light.  Neck: Normal range of motion, full passive range of motion without pain and phonation normal. Neck supple.  Cardiovascular: Normal rate, regular rhythm, normal heart sounds and intact distal pulses.   No murmur heard. Pulmonary/Chest: Effort normal. No stridor. No respiratory distress. She has no rales. She exhibits no tenderness.  Coarse lungs sounds bilaterally.  No wheezing or rales  Musculoskeletal: She exhibits no edema.  Lymphadenopathy:    She has no cervical adenopathy.  Neurological: She is alert and oriented to person, place, and time. She exhibits normal muscle tone. Coordination normal.  Skin: Skin is warm and dry.  Nursing note and vitals reviewed.   ED Course  Procedures (including critical care time)   MDM   Final diagnoses:  Bronchitis   Pt well appearing.  Vitals stable. Has albuterol inhaler at home, agrees to ibuprofen if needed for fever. rx for z pack and tessalon     Pauline Aus, PA-C 12/14/14 2158  Rolland Porter, MD 12/19/14 [phone]

## 2014-12-15 ENCOUNTER — Emergency Department (HOSPITAL_COMMUNITY)
Admission: EM | Admit: 2014-12-15 | Discharge: 2014-12-15 | Disposition: A | Discharge status: Home / Self Care | Payer: Medicaid Other | Attending: Emergency Medicine | Admitting: Emergency Medicine

## 2014-12-15 ENCOUNTER — Encounter (HOSPITAL_COMMUNITY): Payer: Self-pay | Admitting: *Deleted

## 2014-12-15 DIAGNOSIS — J40 Bronchitis, not specified as acute or chronic: Secondary | ICD-10-CM | POA: Insufficient documentation

## 2014-12-15 DIAGNOSIS — Z8719 Personal history of other diseases of the digestive system: Secondary | ICD-10-CM | POA: Diagnosis not present

## 2014-12-15 DIAGNOSIS — Z91040 Latex allergy status: Secondary | ICD-10-CM | POA: Insufficient documentation

## 2014-12-15 DIAGNOSIS — Z793 Long term (current) use of hormonal contraceptives: Secondary | ICD-10-CM | POA: Diagnosis not present

## 2014-12-15 DIAGNOSIS — F17210 Nicotine dependence, cigarettes, uncomplicated: Secondary | ICD-10-CM | POA: Insufficient documentation

## 2014-12-15 DIAGNOSIS — R05 Cough: Secondary | ICD-10-CM | POA: Diagnosis present

## 2014-12-15 DIAGNOSIS — Z79899 Other long term (current) drug therapy: Secondary | ICD-10-CM | POA: Insufficient documentation

## 2014-12-15 DIAGNOSIS — Z8742 Personal history of other diseases of the female genital tract: Secondary | ICD-10-CM | POA: Insufficient documentation

## 2014-12-15 MED ORDER — DM-GUAIFENESIN ER 30-600 MG PO TB12: 1.0000 | ORAL_TABLET | Freq: Two times a day (BID) | ORAL | Status: DC | PRN

## 2014-12-15 MED ORDER — PREDNISONE 10 MG PO TABS: ORAL_TABLET | ORAL | Status: DC

## 2014-12-15 NOTE — ED Provider Notes (Signed)
CSN: [id number]     Arrival date & time 12/15/14  0840 History   First MD Initiated Contact with Patient 12/15/14 0845     Chief Complaint  Patient presents with  . Cough     (Consider location/radiation/quality/duration/timing/severity/associated sxs/prior Treatment) HPI   Joyce Ward is a 29 y.o. female who presents to the Emergency Department complaining of persistent, worsening cough for two weeks.  She was seen here by me 4 days ago for same and now states the cough is worse and more productive.  She complains of pain to her ribs and upper chest with cough.  She was given a z pack and has an inhaler at home that she states she is using.  Her son was also seen here recently for similar sx's.  She denies fever, shortness of breath, abdominal pain, and hx of PE.     Past Medical History  Diagnosis Date  . Endometriosis   . GERD (gastroesophageal reflux disease)   . Pelvic pain in female 08/19/2014  . History of endometriosis 08/19/2014  . Irregular intermenstrual bleeding 08/19/2014   Past Surgical History  Procedure Laterality Date  . Laparoscopy     Family History  Problem Relation Age of Onset  . Endometriosis Mother    Social History  Substance Use Topics  . Smoking status: Current Some Day Smoker -- 1.00 packs/day for 5 years    Types: Cigarettes  . Smokeless tobacco: Never Used  . Alcohol Use: Yes     Comment: occasional   OB History    Gravida Para Term Preterm AB TAB SAB Ectopic Multiple Living   3 2   1  1   2      Review of Systems  Constitutional: Negative for fever, chills and appetite change.  HENT: Positive for congestion. Negative for sore throat and trouble swallowing.   Respiratory: Positive for cough and chest tightness. Negative for shortness of breath and wheezing.   Cardiovascular: Negative for chest pain (bilateral rib pain).  Gastrointestinal: Negative for nausea, vomiting and abdominal pain.  Genitourinary: Negative for dysuria.   Musculoskeletal: Negative for arthralgias and neck pain.  Skin: Negative for rash.  Neurological: Negative for dizziness, weakness and numbness.  Hematological: Negative for adenopathy.  All other systems reviewed and are negative.     Allergies  Codeine; Latex; Naproxen; Toradol; and Tramadol  Home Medications   Prior to Admission medications   Medication Sig Start Date End Date Taking? Authorizing Provider  albuterol (PROVENTIL HFA;VENTOLIN HFA) 108 (90 BASE) MCG/ACT inhaler Inhale 1-2 puffs into the lungs every 6 (six) hours as needed for wheezing or shortness of breath. Patient not taking: Reported on 12/09/2014 10/22/14   Ivery QualeHobson Bryant, PA-C  azithromycin (ZITHROMAX) 250 MG tablet Take 1 tablet (250 mg total) by mouth daily. Take first 2 tablets together, then 1 every day until finished. 12/11/14   Stori Royse, PA-C  benzonatate (TESSALON) 100 MG capsule Take 2 capsules (200 mg total) by mouth 3 (three) times daily as needed for cough. Swallow whole do not chew 12/11/14   Lashan Macias, PA-C  dextromethorphan-guaiFENesin (MUCINEX DM) 30-600 MG 12hr tablet Take 1 tablet by mouth 2 (two) times daily as needed for cough. 12/15/14   Morningstar Toft, PA-C  ibuprofen (ADVIL,MOTRIN) 200 MG tablet Take 600 mg by mouth every 8 (eight) hours as needed for mild pain.    Historical Provider, MD  norethindrone-ethinyl estradiol (JUNEL FE 1/20) 1-20 MG-MCG tablet Take 1 tablet by mouth daily. 12/09/14  Tilda Burrow, MD  oxyCODONE-acetaminophen (PERCOCET/ROXICET) 5-325 MG tablet Take 1-2 tablets by mouth every 4 (four) hours as needed for moderate pain or severe pain. 12/09/14   Tilda Burrow, MD  predniSONE (DELTASONE) 10 MG tablet Take 6 tablets day one, 5 tablets day two, 4 tablets day three, 3 tablets day four, 2 tablets day five, then 1 tablet day six 12/15/14   Brandis Matsuura, PA-C   BP 120/85 mmHg  Pulse 93  Temp(Src) 98.4 F (36.9 C) (Oral)  Resp 16  Ht  (1.575 m)  Wt 117  lb (53.071 kg)  BMI 21.39 kg/m2  SpO2 100%  LMP 12/11/2014 Physical Exam  Constitutional: She is oriented to person, place, and time. She appears well-developed and well-nourished. No distress.  HENT:  Head: Normocephalic and atraumatic.  Right Ear: Tympanic membrane and ear canal normal.  Left Ear: Tympanic membrane and ear canal normal.  Mouth/Throat: Uvula is midline, oropharynx is clear and moist and mucous membranes are normal. No oropharyngeal exudate.  Eyes: EOM are normal. Pupils are equal, round, and reactive to light.  Neck: Normal range of motion, full passive range of motion without pain and phonation normal. Neck supple.  Cardiovascular: Normal rate, regular rhythm, normal heart sounds and intact distal pulses.   No murmur heard. Pulmonary/Chest: Effort normal. No stridor. No respiratory distress. She has no wheezes. She has no rales. She exhibits no tenderness.  Lungs are CTA bilaterally  Musculoskeletal: She exhibits no edema.  Lymphadenopathy:    She has no cervical adenopathy.  Neurological: She is alert and oriented to person, place, and time. She exhibits normal muscle tone. Coordination normal.  Skin: Skin is warm and dry.  Nursing note and vitals reviewed.   ED Course  Procedures (including critical care time)   MDM   Final diagnoses:  Bronchitis    Pt well appearing. Vitals stable.  Well score is low risk for PE.  Imaging not indicated given exam findings.  She is currently taking abx and has inhaler at home.  I will add prednisone taper and robitussin DM.  Pt requesting pain medication, but I do not feel that its indicated.  She received percocet from her GYN earlier this week.      Pauline Aus, PA-C 12/15/14 1001  Raeford Razor, MD 12/15/14 [phone]

## 2014-12-15 NOTE — ED Notes (Addendum)
Pt states cough has gotten worse since last visit. States productive cough, yellow in color. Denies fever.  NAD. VSS.

## 2014-12-18 ENCOUNTER — Encounter: Payer: Self-pay | Admitting: Obstetrics and Gynecology

## 2014-12-18 ENCOUNTER — Emergency Department (HOSPITAL_COMMUNITY)
Admission: EM | Admit: 2014-12-18 | Discharge: 2014-12-18 | Discharge status: Against Medical Advice | Payer: Medicaid Other | Attending: Emergency Medicine | Admitting: Emergency Medicine

## 2014-12-18 ENCOUNTER — Encounter (HOSPITAL_COMMUNITY): Payer: Self-pay | Admitting: Emergency Medicine

## 2014-12-18 ENCOUNTER — Ambulatory Visit (INDEPENDENT_AMBULATORY_CARE_PROVIDER_SITE_OTHER): Payer: Medicaid Other | Admitting: Obstetrics and Gynecology

## 2014-12-18 ENCOUNTER — Telehealth: Payer: Self-pay | Admitting: Obstetrics and Gynecology

## 2014-12-18 DIAGNOSIS — G8929 Other chronic pain: Secondary | ICD-10-CM | POA: Insufficient documentation

## 2014-12-18 DIAGNOSIS — R1030 Lower abdominal pain, unspecified: Secondary | ICD-10-CM | POA: Diagnosis present

## 2014-12-18 DIAGNOSIS — Z79899 Other long term (current) drug therapy: Secondary | ICD-10-CM | POA: Diagnosis not present

## 2014-12-18 DIAGNOSIS — Z793 Long term (current) use of hormonal contraceptives: Secondary | ICD-10-CM | POA: Diagnosis not present

## 2014-12-18 DIAGNOSIS — R197 Diarrhea, unspecified: Secondary | ICD-10-CM | POA: Diagnosis not present

## 2014-12-18 DIAGNOSIS — Z91040 Latex allergy status: Secondary | ICD-10-CM | POA: Insufficient documentation

## 2014-12-18 DIAGNOSIS — F1910 Other psychoactive substance abuse, uncomplicated: Secondary | ICD-10-CM | POA: Diagnosis not present

## 2014-12-18 DIAGNOSIS — Z765 Malingerer [conscious simulation]: Secondary | ICD-10-CM | POA: Diagnosis not present

## 2014-12-18 DIAGNOSIS — F17210 Nicotine dependence, cigarettes, uncomplicated: Secondary | ICD-10-CM | POA: Diagnosis not present

## 2014-12-18 DIAGNOSIS — R102 Pelvic and perineal pain: Secondary | ICD-10-CM | POA: Diagnosis not present

## 2014-12-18 DIAGNOSIS — Z8742 Personal history of other diseases of the female genital tract: Secondary | ICD-10-CM

## 2014-12-18 DIAGNOSIS — F1190 Opioid use, unspecified, uncomplicated: Secondary | ICD-10-CM

## 2014-12-18 DIAGNOSIS — Z8719 Personal history of other diseases of the digestive system: Secondary | ICD-10-CM | POA: Diagnosis not present

## 2014-12-18 MED ORDER — OXYCODONE-ACETAMINOPHEN 5-325 MG PO TABS: 1.0000 | ORAL_TABLET | ORAL | Status: DC | PRN

## 2014-12-18 MED ORDER — MEGESTROL ACETATE 40 MG PO TABS: 40.0000 mg | ORAL_TABLET | Freq: Three times a day (TID) | ORAL | Status: DC

## 2014-12-18 NOTE — ED Notes (Signed)
Pt reports lower abdominal pain x1 week. Pt denies any gi/gu symptoms. Pt reports history of same.

## 2014-12-18 NOTE — ED Notes (Signed)
Patient left AMA. States that she doesn't need to wait for the paper work.

## 2014-12-18 NOTE — Telephone Encounter (Signed)
Dr. Emelda FearFerguson is going to personally speak with this pt.

## 2014-12-18 NOTE — ED Provider Notes (Signed)
CSN: [id number]     Arrival date & time 12/18/14  1042 History  By signing my name below, I, Tanda RockersMargaux Venter, attest that this documentation has been prepared under the direction and in the presence of Azalia BilisKevin Devondre Guzzetta, MD. Electronically Signed: Tanda RockersMargaux Venter, ED Scribe. 12/18/2014. 11:16 AM.  Chief Complaint  Patient presents with  . Abdominal Pain   The history is provided by the patient and a parent. No language interpreter was used.     HPI Comments: Joyce Ward is a 29 y.o. female who presents to the Emergency Department complaining of gradual onset, constant, worsening, lower abdominal pain x 1 week. She also complains of diarrhea for the past 2-3 days. Pt has been taking Ibuprofen without relief. Pt has hx of endometriosis and has had several surgeries for it in the past. She is being followed by Dr. Emelda FearFerguson at [facility] for this. Pt was seen on 12/09/2014 (approximately 10 days ago) at [facility] and recently placed on new birth control that is supposed to stop her menstrual cycle. She states that this has only worsened her pain and that she feels as if she is about to have a period. Pt was also given a small prescription of Percocet for the pain which she is now out of. Pt was referred to a pain clinic at one point but states that the receptionist was "self centered" so pt decided to leave and not return for pain management. Denies dysuria, urgency, frequency, vaginal bleeding, vaginal discharge, nausea, vomiting, or any other associated symptoms.   Past Medical History  Diagnosis Date  . Endometriosis   . GERD (gastroesophageal reflux disease)   . Pelvic pain in female 08/19/2014  . History of endometriosis 08/19/2014  . Irregular intermenstrual bleeding 08/19/2014   Past Surgical History  Procedure Laterality Date  . Laparoscopy     Family History  Problem Relation Age of Onset  . Endometriosis Mother    Social History  Substance Use Topics  . Smoking status:  Current Some Day Smoker -- 1.00 packs/day for 5 years    Types: Cigarettes  . Smokeless tobacco: Never Used  . Alcohol Use: Yes     Comment: occasional   OB History    Gravida Para Term Preterm AB TAB SAB Ectopic Multiple Living   3 2   1  1   2      Review of Systems  A complete 10 system review of systems was obtained and all systems are negative except as noted in the HPI and PMH.   Allergies  Codeine; Latex; Naproxen; Toradol; and Tramadol  Home Medications   Prior to Admission medications   Medication Sig Start Date End Date Taking? Authorizing Provider  albuterol (PROVENTIL HFA;VENTOLIN HFA) 108 (90 BASE) MCG/ACT inhaler Inhale 1-2 puffs into the lungs every 6 (six) hours as needed for wheezing or shortness of breath. Patient not taking: Reported on 12/09/2014 10/22/14   Ivery QualeHobson Bryant, PA-C  azithromycin (ZITHROMAX) 250 MG tablet Take 1 tablet (250 mg total) by mouth daily. Take first 2 tablets together, then 1 every day until finished. 12/11/14   Tammy Triplett, PA-C  benzonatate (TESSALON) 100 MG capsule Take 2 capsules (200 mg total) by mouth 3 (three) times daily as needed for cough. Swallow whole do not chew 12/11/14   Tammy Triplett, PA-C  dextromethorphan-guaiFENesin (MUCINEX DM) 30-600 MG 12hr tablet Take 1 tablet by mouth 2 (two) times daily as needed for cough. 12/15/14   Tammy Triplett, PA-C  ibuprofen (  ADVIL,MOTRIN) 200 MG tablet Take 600 mg by mouth every 8 (eight) hours as needed for mild pain.    Historical Provider, MD  norethindrone-ethinyl estradiol (JUNEL FE 1/20) 1-20 MG-MCG tablet Take 1 tablet by mouth daily. 12/09/14   Tilda Burrow, MD  oxyCODONE-acetaminophen (PERCOCET/ROXICET) 5-325 MG tablet Take 1-2 tablets by mouth every 4 (four) hours as needed for moderate pain or severe pain. 12/09/14   Tilda Burrow, MD  predniSONE (DELTASONE) 10 MG tablet Take 6 tablets day one, 5 tablets day two, 4 tablets day three, 3 tablets day four, 2 tablets day five, then  1 tablet day six 12/15/14   Tammy Triplett, PA-C   Triage Vitals: BP 107/71 mmHg  Pulse 115  Temp(Src) 98.2 F (36.8 C) (Oral)  Resp 18  Ht  (1.575 m)  Wt 116 lb (52.617 kg)  BMI 21.21 kg/m2  SpO2 100%  LMP 12/11/2014   Physical Exam  Constitutional: She is oriented to person, place, and time. She appears well-developed and well-nourished. No distress.  HENT:  Head: Normocephalic and atraumatic.  Eyes: EOM are normal.  Neck: Normal range of motion.  Cardiovascular: Normal rate, regular rhythm and normal heart sounds.   Pulmonary/Chest: Effort normal and breath sounds normal.  Abdominal: Soft. She exhibits no distension. There is tenderness.  Mild lower abdominal tenderness  Musculoskeletal: Normal range of motion.  Neurological: She is alert and oriented to person, place, and time.  Skin: Skin is warm and dry.  Psychiatric: She has a normal mood and affect. Judgment normal.  Nursing note and vitals reviewed.   ED Course  Procedures (including critical care time)  DIAGNOSTIC STUDIES: Oxygen Saturation is 100% on RA, normal by my interpretation.    COORDINATION OF CARE: 11:16 AM-Discussed treatment plan which includes CBC, BMP, rapid drug screen, UA, urine pregnancy with pt at bedside and pt agreed to plan.   Labs Review Labs Reviewed  URINALYSIS, ROUTINE W REFLEX MICROSCOPIC (NOT AT [facility])  PREGNANCY, URINE  URINE RAPID DRUG SCREEN, HOSP PERFORMED  CBC  BASIC METABOLIC PANEL    Imaging Review No results found. I have personally reviewed and evaluated these lab results as part of my medical decision-making.   EKG Interpretation None      MDM   Final diagnoses:  None   This appears to be an exacerbation of chronic pain.  The patient has multiple visits to the ER and has had recurrent chronic lower pelvic pain.  I offered to workup the patient with labs and urinalysis but informed her that are not BLD give her any narcotic prescriptions or opioid like  medications in the emergency department.  She stood up and walked out of the emergency department.  I do not believe there is a life-threatening process occurring.  I believe there is a component of drug-seeking behavior.  Medical screening examination completed.  No life-threatening emergency.   I personally performed the services described in this documentation, which was scribed in my presence. The recorded information has been reviewed and is accurate.         Azalia Bilis, MD 12/18/14 [phone]

## 2014-12-18 NOTE — Progress Notes (Addendum)
[facility] Clinic Visit  Patient name: Joyce Ward MRN [id number]  Date of birth: 1985-04-05  CC & HPI:  Joyce Ward is a 29 y.o. female presenting today for chronic pelvic pain and dysmenorrhea. Pt reports that she has been having associated intermittent menstrual bleeding. She has a h/o endometriosis. She was seen in the ER this morning for the same and per physician note she was told she could have full lab workup and when she told she not be getting narcotic pain medicine she got up and walked out of the ER. She has been seen in the ER 3x in the past 3 days for similar complaint. Pt would like to be put in [facility] medication to control menstrual bleeding. Pt has had 2 prior surgeries and does not want to have another surgery. Was given 20 tablets of Percocet 10 days ago and has been taken 2x daily and has exhausted the medication. She has been to pain clinics in the past but did not feel like it was suitable place for her. She has currently been taking Ibuprofen with no significant relief. She was given 20 percocet 10 days ago by me , and has had these managed by the man with her, allegedly her father.  ROS:  10 Systems reviewed and all are negative for acute change except as noted in the HPI.  Pertinent History Reviewed:   Reviewed: Significant for endometriosis and chronic pelvic pain I have ACQUIRED THE RECORDS FROM HER LAST ENDOMETRIOSIS, AND REVIEW INDICATES THIS patient had minimal endometriosis implants noted And these were laser fulgurated.   Medical         Past Medical History  Diagnosis Date  . Endometriosis   . GERD (gastroesophageal reflux disease)   . Pelvic pain in female 08/19/2014  . History of endometriosis 08/19/2014  . Irregular intermenstrual bleeding 08/19/2014                              Surgical Hx:    Past Surgical History  Procedure Laterality Date  . Laparoscopy     Medications: Reviewed & Updated - see associated section                        Current outpatient prescriptions:  .  albuterol (PROVENTIL HFA;VENTOLIN HFA) 108 (90 BASE) MCG/ACT inhaler, Inhale 1-2 puffs into the lungs every 6 (six) hours as needed for wheezing or shortness of breath. (Patient not taking: Reported on 12/09/2014), Disp: 1 Inhaler, Rfl: 0 .  azithromycin (ZITHROMAX) 250 MG tablet, Take 1 tablet (250 mg total) by mouth daily. Take first 2 tablets together, then 1 every day until finished., Disp: 6 tablet, Rfl: 0 .  benzonatate (TESSALON) 100 MG capsule, Take 2 capsules (200 mg total) by mouth 3 (three) times daily as needed for cough. Swallow whole do not chew, Disp: 21 capsule, Rfl: 0 .  dextromethorphan-guaiFENesin (MUCINEX DM) 30-600 MG 12hr tablet, Take 1 tablet by mouth 2 (two) times daily as needed for cough., Disp: 20 tablet, Rfl: 0 .  ibuprofen (ADVIL,MOTRIN) 200 MG tablet, Take 600 mg by mouth every 8 (eight) hours as needed for mild pain., Disp: , Rfl:  .  norethindrone-ethinyl estradiol (JUNEL FE 1/20) 1-20 MG-MCG tablet, Take 1 tablet by mouth daily., Disp: 1 Package, Rfl: 11 .  oxyCODONE-acetaminophen (PERCOCET/ROXICET) 5-325 MG tablet, Take 1-2 tablets by mouth every 4 (four) hours as needed for moderate  pain or severe pain., Disp: 20 tablet, Rfl: 0 .  predniSONE (DELTASONE) 10 MG tablet, Take 6 tablets day one, 5 tablets day two, 4 tablets day three, 3 tablets day four, 2 tablets day five, then 1 tablet day six, Disp: 21 tablet, Rfl: 0   Social History: Reviewed -  reports that she has been smoking Cigarettes.  She has a 5 pack-year smoking history. She has never used smokeless tobacco.  Objective Findings:  Vitals: Last menstrual period 12/11/2014.  Physical Examination:Physical Examination: General appearance - alert, well appearing, and in no distress, oriented to person, place, and time, chronically ill appearing and eyes appear drooped slightly Mental status - alert, oriented to person, place, and time, normal mood, behavior, speech, dress,  motor activity, and thought processes, affect appropriate to mood Eyes - pupils equal and reactive, extraocular eye movements intact   Discussed with pt expectations of care which I found inconsistent with endometriosis. Pt had opportunity to ask questions and has no further questions at this time.    Greater than 50% was spent in counseling and coordination of care with the patient. Discussion time: 30 minutes    Assessment & Plan:   A:  1. Dysmenorrhea with her minimal break thru bleeding 2. Endometriosis by patient history, was grade I in 2009 3. Suspected drug seeking behavior 4 Pt does not wish to be considered for any further surgical treatements.(this is an apparent surprise to the father) P:  1. Will give Megace to take tid on any days of breakthru bleeding on OCP 2. Will give 2 day short course of Percocet. "Giving meds for mgmt of pain over holiday"refilled percocet x 20 tabs. Father to manage. I have made it perfectly clear that I do not find this story sufficiently credible to continue care. I 3. Recommended pain clinics and f/u care. [facility]  . Will give 30 days emergency only gyn care as required by law will after will d/c care with pt, PT GIVEN INFO ABOUT [facility]   By signing my name below, I, Jarvis Morganaylor Kanna Dafoe, attest that this documentation has been prepared under the direction and in the presence of Tilda BurrowJohn Ameena Vesey V, MD. Electronically Signed: Jarvis Morganaylor Legacy Carrender, ED Scribe. 12/18/2014. 12:08 PM.  I personally performed the services described in this documentation, which was SCRIBED in my presence. The recorded information has been reviewed and considered accurate. It has been edited as necessary during review. Tilda BurrowFERGUSON,Alireza Pollack V, MD

## 2014-12-19 DIAGNOSIS — F1190 Opioid use, unspecified, uncomplicated: Secondary | ICD-10-CM | POA: Insufficient documentation

## 2014-12-23 ENCOUNTER — Ambulatory Visit: Payer: Medicaid Other | Admitting: Obstetrics and Gynecology

## 2014-12-25 ENCOUNTER — Encounter: Payer: Self-pay | Admitting: Obstetrics and Gynecology

## 2015-01-07 ENCOUNTER — Other Ambulatory Visit: Payer: Self-pay | Admitting: Obstetrics and Gynecology

## 2015-01-07 NOTE — Telephone Encounter (Signed)
Pt called stating that she is on medication that is suppose to stop her period because of pain, but she has started her period and is in a lot of pain. Pt would like to know if she could get something for pain. Please contact pt

## 2015-01-07 NOTE — Telephone Encounter (Signed)
Pt has 30 days in which she can be seen for Emergency need. Sounds like she thinks it is an emergency. Have her make an appt.  Or she can go elsewhere.

## 2015-01-08 NOTE — Telephone Encounter (Signed)
Pt given an appt 01/10/2015 at 12:30 pm for evaluation of pain medication.

## 2015-01-10 ENCOUNTER — Encounter: Payer: Self-pay | Admitting: Obstetrics and Gynecology

## 2015-01-10 ENCOUNTER — Ambulatory Visit: Payer: Medicaid Other | Admitting: Obstetrics and Gynecology

## 2015-01-16 ENCOUNTER — Encounter (HOSPITAL_COMMUNITY): Payer: Self-pay | Admitting: Emergency Medicine

## 2015-01-16 ENCOUNTER — Emergency Department (HOSPITAL_COMMUNITY)
Admission: EM | Admit: 2015-01-16 | Discharge: 2015-01-16 | Disposition: A | Discharge status: Home / Self Care | Payer: Medicaid Other | Attending: Emergency Medicine | Admitting: Emergency Medicine

## 2015-01-16 DIAGNOSIS — K0889 Other specified disorders of teeth and supporting structures: Secondary | ICD-10-CM | POA: Insufficient documentation

## 2015-01-16 DIAGNOSIS — F17210 Nicotine dependence, cigarettes, uncomplicated: Secondary | ICD-10-CM | POA: Diagnosis not present

## 2015-01-16 DIAGNOSIS — Z8742 Personal history of other diseases of the female genital tract: Secondary | ICD-10-CM | POA: Diagnosis not present

## 2015-01-16 DIAGNOSIS — Z91040 Latex allergy status: Secondary | ICD-10-CM | POA: Insufficient documentation

## 2015-01-16 DIAGNOSIS — Z79899 Other long term (current) drug therapy: Secondary | ICD-10-CM | POA: Diagnosis not present

## 2015-01-16 DIAGNOSIS — Z8719 Personal history of other diseases of the digestive system: Secondary | ICD-10-CM | POA: Insufficient documentation

## 2015-01-16 DIAGNOSIS — K029 Dental caries, unspecified: Secondary | ICD-10-CM | POA: Insufficient documentation

## 2015-01-16 MED ORDER — AMOXICILLIN 500 MG PO CAPS
500.0000 mg | ORAL_CAPSULE | Freq: Three times a day (TID) | ORAL | Status: DC
Start: 2015-01-16 — End: 2015-01-16

## 2015-01-16 MED ORDER — CEFTRIAXONE SODIUM 1 G IJ SOLR
1.0000 g | Freq: Once | INTRAMUSCULAR | Status: DC
  Filled 2015-01-16: qty 10

## 2015-01-16 MED ORDER — AMOXICILLIN 500 MG PO CAPS: 500.0000 mg | ORAL_CAPSULE | Freq: Three times a day (TID) | ORAL | Status: DC

## 2015-01-16 MED ORDER — LIDOCAINE HCL (PF) 1 % IJ SOLN
INTRAMUSCULAR | Status: AC
  Filled 2015-01-16: qty 5

## 2015-01-16 MED ORDER — HYDROCODONE-ACETAMINOPHEN 5-325 MG PO TABS: 1.0000 | ORAL_TABLET | ORAL | Status: DC | PRN

## 2015-01-16 NOTE — ED Notes (Signed)
Pt c/o left lower toothache x 1 month. States she was seen by dentist for xrays a few weeks ago and is currently looking for new dentist who accepts her insurance. Pt c/o pain radiating intro jaw/neck.

## 2015-01-16 NOTE — ED Notes (Signed)
Pt refused antibiotic injection, says could not wait any longer, she has to go get her children.

## 2015-01-16 NOTE — ED Provider Notes (Signed)
CSN: [id number]     Arrival date & time 01/16/15  [id number] History   First MD Initiated Contact with Patient 01/16/15 [phone]     Chief Complaint  Patient presents with  . Dental Pain     (Consider location/radiation/quality/duration/timing/severity/associated sxs/prior Treatment) Patient is a [age] y.o. female presenting with tooth pain. The history is provided by the patient.  Dental Pain Location:  Lower Quality:  Shooting and throbbing Onset quality:  Gradual Duration:  1 month Timing:  Intermittent Progression:  Worsening Chronicity:  Chronic Context: dental caries and poor dentition   Relieved by:  Nothing Ineffective treatments:  Acetaminophen and NSAIDs Associated symptoms: facial pain and gum swelling   Associated symptoms: no trismus   Risk factors: lack of dental care   Risk factors: no diabetes and no immunosuppression     Past Medical History  Diagnosis Date  . Endometriosis   . GERD (gastroesophageal reflux disease)   . Pelvic pain in female 08/19/2014  . History of endometriosis 08/19/2014  . Irregular intermenstrual bleeding 08/19/2014   Past Surgical History  Procedure Laterality Date  . Laparoscopy     Family History  Problem Relation Age of Onset  . Endometriosis Mother    Social History  Substance Use Topics  . Smoking status: Current Some Day Smoker -- 1.00 packs/day for 5 years    Types: Cigarettes  . Smokeless tobacco: Never Used  . Alcohol Use: Yes     Comment: occasional   OB History    Gravida Para Term Preterm AB TAB SAB Ectopic Multiple Living   3 2   1  1   2      Review of Systems  HENT: Positive for dental problem.   All other systems reviewed and are negative.     Allergies  Codeine; Latex; Naproxen; Toradol; and Tramadol  Home Medications   Prior to Admission medications   Medication Sig Start Date End Date Taking? Authorizing Provider  acetaminophen (TYLENOL) 500 MG tablet Take 1,000 mg by mouth every 6 (six) hours as needed  for moderate pain.   Yes Historical Provider, MD  ibuprofen (ADVIL,MOTRIN) 200 MG tablet Take 600 mg by mouth every 8 (eight) hours as needed for mild pain.   Yes Historical Provider, MD  norethindrone-ethinyl estradiol (JUNEL FE 1/20) 1-20 MG-MCG tablet Take 1 tablet by mouth daily. 12/09/14  Yes Tilda BurrowJohn Ferguson V, MD  albuterol (PROVENTIL HFA;VENTOLIN HFA) 108 (90 BASE) MCG/ACT inhaler Inhale 1-2 puffs into the lungs every 6 (six) hours as needed for wheezing or shortness of breath. Patient not taking: Reported on 12/09/2014 10/22/14   Ivery QualeHobson Rylei Masella, PA-C  azithromycin (ZITHROMAX) 250 MG tablet Take 1 tablet (250 mg total) by mouth daily. Take first 2 tablets together, then 1 every day until finished. Patient not taking: Reported on 01/16/2015 12/11/14   Tammy Triplett, PA-C  benzonatate (TESSALON) 100 MG capsule Take 2 capsules (200 mg total) by mouth 3 (three) times daily as needed for cough. Swallow whole do not chew Patient not taking: Reported on 01/16/2015 12/11/14   Tammy Triplett, PA-C  dextromethorphan-guaiFENesin (MUCINEX DM) 30-600 MG 12hr tablet Take 1 tablet by mouth 2 (two) times daily as needed for cough. Patient not taking: Reported on 01/16/2015 12/15/14   Tammy Triplett, PA-C  megestrol (MEGACE) 40 MG tablet Take 1 tablet (40 mg total) by mouth 3 (three) times daily. Patient not taking: Reported on 01/16/2015 12/18/14   Tilda BurrowJohn Ferguson V, MD  oxyCODONE-acetaminophen (PERCOCET/ROXICET) 5-325 MG tablet Take 1-2 tablets  by mouth every 4 (four) hours as needed for moderate pain or severe pain. Patient not taking: Reported on 01/16/2015 12/18/14   Tilda Burrow, MD  predniSONE (DELTASONE) 10 MG tablet Take 6 tablets day one, 5 tablets day two, 4 tablets day three, 3 tablets day four, 2 tablets day five, then 1 tablet day six Patient not taking: Reported on 01/16/2015 12/15/14   Tammy Triplett, PA-C   BP 115/83 mmHg  Pulse 90  Temp(Src) 98.4 F (36.9 C)  Resp 18  Ht  (1.575 m)   Wt 53.524 kg  BMI 21.58 kg/m2  SpO2 100%  LMP 01/16/2015 Physical Exam  Constitutional: She is oriented to person, place, and time. She appears well-developed and well-nourished.  Non-toxic appearance.  HENT:  Head: Normocephalic.  Right Ear: Tympanic membrane and external ear normal.  Left Ear: Tympanic membrane and external ear normal.  Pain to palpation and percussion of the 2nd and 3rd left lower molar. Mild swelling. No abscess. No swelling under the tongue. Airway patent.  Eyes: EOM and lids are normal. Pupils are equal, round, and reactive to light.  Neck: Normal range of motion. Neck supple. Carotid bruit is not present.  Cardiovascular: Normal rate, regular rhythm, normal heart sounds, intact distal pulses and normal pulses.   Pulmonary/Chest: Breath sounds normal. No respiratory distress.  Abdominal: Soft. Bowel sounds are normal. There is no tenderness. There is no guarding.  Musculoskeletal: Normal range of motion.  Lymphadenopathy:       Head (right side): No submandibular adenopathy present.       Head (left side): No submandibular adenopathy present.    She has no cervical adenopathy.  Neurological: She is alert and oriented to person, place, and time. She has normal strength. No cranial nerve deficit or sensory deficit.  Skin: Skin is warm and dry.  Psychiatric: She has a normal mood and affect. Her speech is normal.  Nursing note and vitals reviewed.   ED Course  Procedures (including critical care time) Labs Review Labs Reviewed - No data to display  Imaging Review No results found. I have personally reviewed and evaluated these images and lab results as part of my medical decision-making.   EKG Interpretation None      MDM Vital signs reviewed. No exam evidence for trismus. No noted Ludwig's angina. No visible abscess. Pt states she was told by a dentist that she needed to be seen by an oral surgeon, and she has found it difficult to find one accepting  Medicaid. I spoke with Dr Randa Evens office and they will accept Medicaid. Pt allergic to NSAID and Codeine. Rx for norco and amoxil given to the patient.   Final diagnoses:  None    *I have reviewed nursing notes, vital signs, and all appropriate lab and imaging results for this patient.[address], PA-C 01/16/15 1610  Loren Racer, MD 01/18/15 [phone]

## 2015-01-16 NOTE — Discharge Instructions (Signed)
Use amoxil three times daily until all taken. Use tylenol for mild pain, and norco for more severe pain. Call Dr Jadene PieriniJensen(oral surgeon) as soon as possible for office appointment.

## 2015-01-22 ENCOUNTER — Encounter (HOSPITAL_COMMUNITY): Payer: Self-pay | Admitting: Emergency Medicine

## 2015-01-22 ENCOUNTER — Emergency Department (HOSPITAL_COMMUNITY)
Admission: EM | Admit: 2015-01-22 | Discharge: 2015-01-22 | Disposition: A | Discharge status: Home / Self Care | Payer: Medicaid Other | Attending: Emergency Medicine | Admitting: Emergency Medicine

## 2015-01-22 DIAGNOSIS — Z793 Long term (current) use of hormonal contraceptives: Secondary | ICD-10-CM | POA: Diagnosis not present

## 2015-01-22 DIAGNOSIS — K0889 Other specified disorders of teeth and supporting structures: Secondary | ICD-10-CM | POA: Diagnosis not present

## 2015-01-22 DIAGNOSIS — Z8742 Personal history of other diseases of the female genital tract: Secondary | ICD-10-CM | POA: Insufficient documentation

## 2015-01-22 DIAGNOSIS — F17210 Nicotine dependence, cigarettes, uncomplicated: Secondary | ICD-10-CM | POA: Insufficient documentation

## 2015-01-22 DIAGNOSIS — Z79899 Other long term (current) drug therapy: Secondary | ICD-10-CM | POA: Diagnosis not present

## 2015-01-22 DIAGNOSIS — G8929 Other chronic pain: Secondary | ICD-10-CM | POA: Diagnosis not present

## 2015-01-22 DIAGNOSIS — Z8719 Personal history of other diseases of the digestive system: Secondary | ICD-10-CM | POA: Diagnosis not present

## 2015-01-22 DIAGNOSIS — Z91040 Latex allergy status: Secondary | ICD-10-CM | POA: Insufficient documentation

## 2015-01-22 MED ORDER — OXYCODONE-ACETAMINOPHEN 5-325 MG PO TABS: 1.0000 | ORAL_TABLET | ORAL | Status: DC | PRN

## 2015-01-22 NOTE — ED Notes (Signed)
To follow up with dentist Friday

## 2015-01-22 NOTE — Discharge Instructions (Signed)
Follow-up with your dentist, on Friday, as scheduled.

## 2015-01-22 NOTE — ED Provider Notes (Signed)
CSN: [id number]     Arrival date & time 01/22/15  [id number] History   First MD Initiated Contact with Patient 01/22/15 0431     Chief Complaint  Patient presents with  . Dental Pain     (Consider location/radiation/quality/duration/timing/severity/associated sxs/prior Treatment) HPI   Joyce Ward is a 29 y.o. female who presents for evaluation of left lower dental pain, ongoing for 3 weeks. She states that she was referred, by a dentist, 3 weeks ago to see a oral Care[employer] advisersurgeon. She was in the emergency room last week and treated for pain, and dental infection. She has completed these medications, but the pain has returned. Pain radiates to her left jaw and ear. She admits to chronic pelvic pain, and has decided to have a hysterectomy, which will be done in February, by her report. She has not seen a pain management doctor, as requested by her gynecologist last month. She had been receiving frequent narcotics from him, but he has discharged her from the practice. She states she is unable to eat because of the pain. There are no other known modifying factors.   Past Medical History  Diagnosis Date  . Endometriosis   . GERD (gastroesophageal reflux disease)   . Pelvic pain in female 08/19/2014  . History of endometriosis 08/19/2014  . Irregular intermenstrual bleeding 08/19/2014   Past Surgical History  Procedure Laterality Date  . Laparoscopy     Family History  Problem Relation Age of Onset  . Endometriosis Mother    Social History  Substance Use Topics  . Smoking status: Current Some Day Smoker -- 1.00 packs/day for 5 years    Types: Cigarettes  . Smokeless tobacco: Never Used  . Alcohol Use: Yes     Comment: occasional   OB History    Gravida Para Term Preterm AB TAB SAB Ectopic Multiple Living   3 2   1  1   2      Review of Systems  All other systems reviewed and are negative.     Allergies  Codeine; Latex; Naproxen; Toradol; and Tramadol  Home Medications   Prior to  Admission medications   Medication Sig Start Date End Date Taking? Authorizing Provider  acetaminophen (TYLENOL) 500 MG tablet Take 1,000 mg by mouth every 6 (six) hours as needed for moderate pain.    Historical Provider, MD  albuterol (PROVENTIL HFA;VENTOLIN HFA) 108 (90 BASE) MCG/ACT inhaler Inhale 1-2 puffs into the lungs every 6 (six) hours as needed for wheezing or shortness of breath. Patient not taking: Reported on 12/09/2014 10/22/14   Ivery QualeHobson Bryant, PA-C  megestrol (MEGACE) 40 MG tablet Take 1 tablet (40 mg total) by mouth 3 (three) times daily. Patient not taking: Reported on 01/16/2015 12/18/14   Tilda BurrowJohn Ferguson V, MD  norethindrone-ethinyl estradiol (JUNEL FE 1/20) 1-20 MG-MCG tablet Take 1 tablet by mouth daily. 12/09/14   Tilda BurrowJohn Ferguson V, MD  oxyCODONE-acetaminophen (PERCOCET) 5-325 MG tablet Take 1-2 tablets by mouth every 4 (four) hours as needed. 01/22/15   Mancel BaleElliott Jadira Nierman, MD   BP 111/78 mmHg  Pulse 95  Temp(Src) 98.4 F (36.9 C)  Resp 18  Ht 5\' 2"  (1.575 m)  Wt 120 lb (54.432 kg)  BMI 21.94 kg/m2  SpO2 100%  LMP 01/16/2015 Physical Exam  Constitutional: She is oriented to person, place, and time. She appears well-developed and well-nourished. She appears distressed (Tearful, pacing the floor).  HENT:  Head: Normocephalic and atraumatic.  Right Ear: External ear normal.  Left Ear: External  ear normal.  Dentition appears normal. There is no oral swelling. There is no trismus.  Eyes: Conjunctivae and EOM are normal. Pupils are equal, round, and reactive to light.  Neck: Normal range of motion and phonation normal. Neck supple.  Cardiovascular: Normal rate.   Pulmonary/Chest: Effort normal. She exhibits no bony tenderness.  Abdominal: Soft. There is no tenderness.  Musculoskeletal: Normal range of motion.  Neurological: She is alert and oriented to person, place, and time. No cranial nerve deficit or sensory deficit. She exhibits normal muscle tone. Coordination normal.   Skin: Skin is warm, dry and intact.  Psychiatric: She has a normal mood and affect. Her behavior is normal. Judgment and thought content normal.  Nursing note and vitals reviewed.   ED Course  Procedures (including critical care time)   Medications - No data to display  Patient Vitals for the past 24 hrs:  BP Temp Pulse Resp SpO2 Height Weight  01/22/15 0321 111/78 mmHg 98.4 F (36.9 C) 95 18 100 %  (1.575 m) 120 lb (54.432 kg)    4:41 AM Reevaluation with update and discussion. After initial assessment and treatment, an updated evaluation reveals findings discussed with patient, all questions answered.Mancel Bale L     Labs Review Labs Reviewed - No data to display  Imaging Review No results found. I have personally reviewed and evaluated these images and lab results as part of my medical decision-making.   EKG Interpretation None      MDM   Final diagnoses:  Pain, dental    Nonspecific dental pain, with a history of drug seeking behavior. Patient is visibly uncomfortable. She may have dental problem, unlikely to be infectious. We'll give her the benefit of doubt and give her short-term treatment with narcotics, pending her follow-up appointment, which she states is scheduled for 2 days hence.  Nursing Notes Reviewed/ Care Coordinated Applicable Imaging Reviewed Interpretation of Laboratory Data incorporated into ED treatment  The patient appears reasonably screened and/or stabilized for discharge and I doubt any other medical condition or other [facility] requiring further screening, evaluation, or treatment in the ED at this time prior to discharge.  Plan: Home Medications- Percocet; Home Treatments- rest; return here if the recommended treatment, does not improve the symptoms; Recommended follow up- PCP prn     Mancel Bale, MD 01/22/15 [phone]

## 2015-01-22 NOTE — ED Notes (Signed)
Pt c/o dental pain for a while.

## 2015-01-22 NOTE — ED Notes (Signed)
Pt states understanding of care given and follow up instruction 

## 2015-01-26 ENCOUNTER — Emergency Department (HOSPITAL_COMMUNITY)
Admission: EM | Admit: 2015-01-26 | Discharge: 2015-01-26 | Disposition: A | Discharge status: Home / Self Care | Payer: Medicaid Other | Attending: Emergency Medicine | Admitting: Emergency Medicine

## 2015-01-26 ENCOUNTER — Emergency Department (HOSPITAL_COMMUNITY): Payer: Medicaid Other

## 2015-01-26 ENCOUNTER — Encounter (HOSPITAL_COMMUNITY): Payer: Self-pay | Admitting: Emergency Medicine

## 2015-01-26 DIAGNOSIS — S0990XA Unspecified injury of head, initial encounter: Secondary | ICD-10-CM | POA: Insufficient documentation

## 2015-01-26 DIAGNOSIS — Z91040 Latex allergy status: Secondary | ICD-10-CM | POA: Diagnosis not present

## 2015-01-26 DIAGNOSIS — Z8719 Personal history of other diseases of the digestive system: Secondary | ICD-10-CM | POA: Insufficient documentation

## 2015-01-26 DIAGNOSIS — S060X0A Concussion without loss of consciousness, initial encounter: Secondary | ICD-10-CM | POA: Insufficient documentation

## 2015-01-26 DIAGNOSIS — Y92410 Unspecified street and highway as the place of occurrence of the external cause: Secondary | ICD-10-CM | POA: Insufficient documentation

## 2015-01-26 DIAGNOSIS — M542 Cervicalgia: Secondary | ICD-10-CM

## 2015-01-26 DIAGNOSIS — Y998 Other external cause status: Secondary | ICD-10-CM | POA: Diagnosis not present

## 2015-01-26 DIAGNOSIS — F17210 Nicotine dependence, cigarettes, uncomplicated: Secondary | ICD-10-CM | POA: Insufficient documentation

## 2015-01-26 DIAGNOSIS — Z8742 Personal history of other diseases of the female genital tract: Secondary | ICD-10-CM | POA: Diagnosis not present

## 2015-01-26 DIAGNOSIS — Z793 Long term (current) use of hormonal contraceptives: Secondary | ICD-10-CM | POA: Insufficient documentation

## 2015-01-26 DIAGNOSIS — G8929 Other chronic pain: Secondary | ICD-10-CM | POA: Diagnosis not present

## 2015-01-26 DIAGNOSIS — Y9389 Activity, other specified: Secondary | ICD-10-CM | POA: Diagnosis not present

## 2015-01-26 DIAGNOSIS — S199XXA Unspecified injury of neck, initial encounter: Secondary | ICD-10-CM | POA: Diagnosis present

## 2015-01-26 DIAGNOSIS — Z79899 Other long term (current) drug therapy: Secondary | ICD-10-CM | POA: Diagnosis not present

## 2015-01-26 MED ORDER — IBUPROFEN 800 MG PO TABS: 800.0000 mg | ORAL_TABLET | Freq: Three times a day (TID) | ORAL | Status: DC

## 2015-01-26 MED ORDER — ACETAMINOPHEN 500 MG PO TABS
ORAL_TABLET | ORAL | Status: AC
  Administered 2015-01-26: 1000 mg
  Filled 2015-01-26: qty 2

## 2015-01-26 MED ORDER — ACETAMINOPHEN 500 MG PO TABS
1000.0000 mg | ORAL_TABLET | Freq: Once | ORAL | Status: AC
  Administered 2015-01-26: 1000 mg via ORAL

## 2015-01-26 MED ORDER — METHOCARBAMOL 500 MG PO TABS: 500.0000 mg | ORAL_TABLET | Freq: Two times a day (BID) | ORAL | Status: DC
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
## 2015-01-26 NOTE — ED Notes (Signed)
c collar placed on pt

## 2015-01-26 NOTE — Discharge Instructions (Signed)
Concussion, Adult A concussion, or closed-head injury, is a brain injury caused by a direct blow to the head or by a quick and sudden movement (jolt) of the head or neck. Concussions are usually not life-threatening. Even so, the effects of a concussion can be serious. If you have had a concussion before, you are more likely to experience concussion-like symptoms after a direct blow to the head.  CAUSES  Direct blow to the head, such as from running into another player during a soccer game, being hit in a fight, or hitting your head on a hard surface.  A jolt of the head or neck that causes the brain to move back and forth inside the skull, such as in a car crash. SIGNS AND SYMPTOMS The signs of a concussion can be hard to notice. Early on, they may be missed by you, family members, and health care providers. You may look fine but act or feel differently. Symptoms are usually temporary, but they may last for days, weeks, or even longer. Some symptoms may appear right away while others may not show up for hours or days. Every head injury is different. Symptoms include:  Mild to moderate headaches that will not go away.  A feeling of pressure inside your head.  Having more trouble than usual:  Learning or remembering things you have heard.  Answering questions.  Paying attention or concentrating.  Organizing daily tasks.  Making decisions and solving problems.  Slowness in thinking, acting or reacting, speaking, or reading.  Getting lost or being easily confused.  Feeling tired all the time or lacking energy (fatigued).  Feeling drowsy.  Sleep disturbances.  Sleeping more than usual.  Sleeping less than usual.  Trouble falling asleep.  Trouble sleeping (insomnia).  Loss of balance or feeling lightheaded or dizzy.  Nausea or vomiting.  Numbness or tingling.  Increased sensitivity to:  Sounds.  Lights.  Distractions.  Vision problems or eyes that tire  easily.  Diminished sense of taste or smell.  Ringing in the ears.  Mood changes such as feeling sad or anxious.  Becoming easily irritated or angry for little or no reason.  Lack of motivation.  Seeing or hearing things other people do not see or hear (hallucinations). DIAGNOSIS Your health care provider can usually diagnose a concussion based on a description of your injury and symptoms. He or she will ask whether you passed out (lost consciousness) and whether you are having trouble remembering events that happened right before and during your injury. Your evaluation might include:  A brain scan to look for signs of injury to the brain. Even if the test shows no injury, you may still have a concussion.  Blood tests to be sure other problems are not present. TREATMENT  Concussions are usually treated in an emergency department, in urgent care, or at a clinic. You may need to stay in the hospital overnight for further treatment.  Tell your health care provider if you are taking any medicines, including prescription medicines, over-the-counter medicines, and natural remedies. Some medicines, such as blood thinners (anticoagulants) and aspirin, may increase the chance of complications. Also tell your health care provider whether you have had alcohol or are taking illegal drugs. This information may affect treatment.  Your health care provider will send you home with important instructions to follow.  How fast you will recover from a concussion depends on many factors. These factors include how severe your concussion is, what part of your brain was injured,   your age, and how healthy you were before the concussion.  Most people with mild injuries recover fully. Recovery can take time. In general, recovery is slower in older persons. Also, persons who have had a concussion in the past or have other medical problems may find that it takes longer to recover from their current injury. HOME  CARE INSTRUCTIONS General Instructions  Carefully follow the directions your health care provider gave you.  Only take over-the-counter or prescription medicines for pain, discomfort, or fever as directed by your health care provider.  Take only those medicines that your health care provider has approved.  Do not drink alcohol until your health care provider says you are well enough to do so. Alcohol and certain other drugs may slow your recovery and can put you at risk of further injury.  If it is harder than usual to remember things, write them down.  If you are easily distracted, try to do one thing at a time. For example, do not try to watch TV while fixing dinner.  Talk with family members or close friends when making important decisions.  Keep all follow-up appointments. Repeated evaluation of your symptoms is recommended for your recovery.  Watch your symptoms and tell others to do the same. Complications sometimes occur after a concussion. Older adults with a brain injury may have a higher risk of serious complications, such as a blood clot on the brain.  Tell your teachers, school nurse, school counselor, coach, athletic trainer, or work manager about your injury, symptoms, and restrictions. Tell them about what you can or cannot do. They should watch for:  Increased problems with attention or concentration.  Increased difficulty remembering or learning new information.  Increased time needed to complete tasks or assignments.  Increased irritability or decreased ability to cope with stress.  Increased symptoms.  Rest. Rest helps the brain to heal. Make sure you:  Get plenty of sleep at night. Avoid staying up late at night.  Keep the same bedtime hours on weekends and weekdays.  Rest during the day. Take daytime naps or rest breaks when you feel tired.  Limit activities that require a lot of thought or concentration. These include:  Doing homework or job-related  work.  Watching TV.  Working on the computer.  Avoid any situation where there is potential for another head injury (football, hockey, soccer, basketball, martial arts, downhill snow sports and horseback riding). Your condition will get worse every time you experience a concussion. You should avoid these activities until you are evaluated by the appropriate follow-up health care providers. Returning To Your Regular Activities You will need to return to your normal activities slowly, not all at once. You must give your body and brain enough time for recovery.  Do not return to sports or other athletic activities until your health care provider tells you it is safe to do so.  Ask your health care provider when you can drive, ride a bicycle, or operate heavy machinery. Your ability to react may be slower after a brain injury. Never do these activities if you are dizzy.  Ask your health care provider about when you can return to work or school. Preventing Another Concussion It is very important to avoid another brain injury, especially before you have recovered. In rare cases, another injury can lead to permanent brain damage, brain swelling, or death. The risk of this is greatest during the first 7-10 days after a head injury. Avoid injuries by:  Wearing a   seat belt when riding in a car.  Drinking alcohol only in moderation.  Wearing a helmet when biking, skiing, skateboarding, skating, or doing similar activities.  Avoiding activities that could lead to a second concussion, such as contact or recreational sports, until your health care provider says it is okay.  Taking safety measures in your home.  Remove clutter and tripping hazards from floors and stairways.  Use grab bars in bathrooms and handrails by stairs.  Place non-slip mats on floors and in bathtubs.  Improve lighting in dim areas. SEEK MEDICAL CARE IF:  You have increased problems paying attention or  concentrating.  You have increased difficulty remembering or learning new information.  You need more time to complete tasks or assignments than before.  You have increased irritability or decreased ability to cope with stress.  You have more symptoms than before. Seek medical care if you have any of the following symptoms for more than 2 weeks after your injury:  Lasting (chronic) headaches.  Dizziness or balance problems.  Nausea.  Vision problems.  Increased sensitivity to noise or light.  Depression or mood swings.  Anxiety or irritability.  Memory problems.  Difficulty concentrating or paying attention.  Sleep problems.  Feeling tired all the time. SEEK IMMEDIATE MEDICAL CARE IF:  You have severe or worsening headaches. These may be a sign of a blood clot in the brain.  You have weakness (even if only in one hand, leg, or part of the face).  You have numbness.  You have decreased coordination.  You vomit repeatedly.  You have increased sleepiness.  One pupil is larger than the other.  You have convulsions.  You have slurred speech.  You have increased confusion. This may be a sign of a blood clot in the brain.  You have increased restlessness, agitation, or irritability.  You are unable to recognize people or places.  You have neck pain.  It is difficult to wake you up.  You have unusual behavior changes.  You lose consciousness. MAKE SURE YOU:  Understand these instructions.  Will watch your condition.  Will get help right away if you are not doing well or get worse.   This information is not intended to replace advice given to you by your health care provider. Make sure you discuss any questions you have with your health care provider.   Document Released: 04/03/2003 Document Revised: 02/01/2014 Document Reviewed: 08/03/2012 Elsevier Interactive Patient Education 2016 [employer]  Cervical Sprain A cervical sprain is an injury  in the neck in which the strong, fibrous tissues (ligaments) that connect your neck bones stretch or tear. Cervical sprains can range from mild to severe. Severe cervical sprains can cause the neck vertebrae to be unstable. This can lead to damage of the spinal cord and can result in serious nervous system problems. The amount of time it takes for a cervical sprain to get better depends on the cause and extent of the injury. Most cervical sprains heal in 1 to 3 weeks. CAUSES  Severe cervical sprains may be caused by:   Contact sport injuries (such as from football, rugby, wrestling, hockey, auto racing, gymnastics, diving, martial arts, or boxing).   Motor vehicle collisions.   Whiplash injuries. This is an injury from a sudden forward and backward whipping movement of the head and neck.  Falls.  Mild cervical sprains may be caused by:   Being in an awkward position, such as while cradling a telephone between your ear and   shoulder.   Sitting in a chair that does not offer proper support.   Working at a poorly designed computer station.   Looking up or down for long periods of time.  SYMPTOMS   Pain, soreness, stiffness, or a burning sensation in the front, back, or sides of the neck. This discomfort may develop immediately after the injury or slowly, 24 hours or more after the injury.   Pain or tenderness directly in the middle of the back of the neck.   Shoulder or upper back pain.   Limited ability to move the neck.   Headache.   Dizziness.   Weakness, numbness, or tingling in the hands or arms.   Muscle spasms.   Difficulty swallowing or chewing.   Tenderness and swelling of the neck.  DIAGNOSIS  Most of the time your health care provider can diagnose a cervical sprain by taking your history and doing a physical exam. Your health care provider will ask about previous neck injuries and any known neck problems, such as arthritis in the neck. X-rays may be  taken to find out if there are any other problems, such as with the bones of the neck. Other tests, such as a CT scan or MRI, may also be needed.  TREATMENT  Treatment depends on the severity of the cervical sprain. Mild sprains can be treated with rest, keeping the neck in place (immobilization), and pain medicines. Severe cervical sprains are immediately immobilized. Further treatment is done to help with pain, muscle spasms, and other symptoms and may include:  Medicines, such as pain relievers, numbing medicines, or muscle relaxants.   Physical therapy. This may involve stretching exercises, strengthening exercises, and posture training. Exercises and improved posture can help stabilize the neck, strengthen muscles, and help stop symptoms from returning.  HOME CARE INSTRUCTIONS   Put ice on the injured area.   Put ice in a plastic bag.   Place a towel between your skin and the bag.   Leave the ice on for 15-20 minutes, 3-4 times a day.   If your injury was severe, you may have been given a cervical collar to wear. A cervical collar is a two-piece collar designed to keep your neck from moving while it heals.  Do not remove the collar unless instructed by your health care provider.  If you have long hair, keep it outside of the collar.  Ask your health care provider before making any adjustments to your collar. Minor adjustments may be required over time to improve comfort and reduce pressure on your chin or on the back of your head.  Ifyou are allowed to remove the collar for cleaning or bathing, follow your health care provider's instructions on how to do so safely.  Keep your collar clean by wiping it with mild soap and water and drying it completely. If the collar you have been given includes removable pads, remove them every 1-2 days and hand wash them with soap and water. Allow them to air dry. They should be completely dry before you wear them in the collar.  If you are  allowed to remove the collar for cleaning and bathing, wash and dry the skin of your neck. Check your skin for irritation or sores. If you see any, tell your health care provider.  Do not drive while wearing the collar.   Only take over-the-counter or prescription medicines for pain, discomfort, or fever as directed by your health care provider.   Keep all follow-up appointments   as directed by your health care provider.   Keep all physical therapy appointments as directed by your health care provider.   Make any needed adjustments to your workstation to promote good posture.   Avoid positions and activities that make your symptoms worse.   Warm up and stretch before being active to help prevent problems.  SEEK MEDICAL CARE IF:   Your pain is not controlled with medicine.   You are unable to decrease your pain medicine over time as planned.   Your activity level is not improving as expected.  SEEK IMMEDIATE MEDICAL CARE IF:   You develop any bleeding.  You develop stomach upset.  You have signs of an allergic reaction to your medicine.   Your symptoms get worse.   You develop new, unexplained symptoms.   You have numbness, tingling, weakness, or paralysis in any part of your body.  MAKE SURE YOU:   Understand these instructions.  Will watch your condition.  Will get help right away if you are not doing well or get worse.   This information is not intended to replace advice given to you by your health care provider. Make sure you discuss any questions you have with your health care provider.   Document Released: 11/08/2006 Document Revised: 01/16/2013 Document Reviewed: 07/19/2012 Elsevier Interactive Patient Education 2016 [employer]  

## 2015-01-26 NOTE — L&D Delivery Note (Signed)
30 y.o. [id number] at 1668w1d delivered a viable female infant in cephalic, OA position. No nuchal cord. Anterior shoulder delivered with ease. 60 sec delayed cord clamping. Cord clamped x2 and cut. Placenta delivered spontaneously intact, with 3VC. Fundus firm on exam with massage and pitocin. Good hemostasis noted.  Laceration: None Suture: NA Good hemostasis noted. EBL: 100 cc  Mom recovering in LDR.   Baby to nursery with adoptive parents.   Apgars:9,9 Weight:pending   Freddrick MarchYashika Amin, MD PGY-1 01/21/2016, 12:17 PM  Midwife attestation: I was gloved and present for delivery in its entirety and I agree with the above resident's note.  Sharen CounterLisa Leftwich-Kirby, CNM 8:36 AM

## 2015-02-04 ENCOUNTER — Encounter (HOSPITAL_COMMUNITY): Payer: Self-pay | Admitting: Emergency Medicine

## 2015-02-04 ENCOUNTER — Emergency Department (HOSPITAL_COMMUNITY)
Admission: EM | Admit: 2015-02-04 | Discharge: 2015-02-04 | Disposition: A | Discharge status: Home / Self Care | Payer: Medicaid Other | Attending: Emergency Medicine | Admitting: Emergency Medicine

## 2015-02-04 ENCOUNTER — Emergency Department (HOSPITAL_COMMUNITY): Payer: Medicaid Other

## 2015-02-04 DIAGNOSIS — Z793 Long term (current) use of hormonal contraceptives: Secondary | ICD-10-CM | POA: Diagnosis not present

## 2015-02-04 DIAGNOSIS — Z79899 Other long term (current) drug therapy: Secondary | ICD-10-CM | POA: Diagnosis not present

## 2015-02-04 DIAGNOSIS — Z91040 Latex allergy status: Secondary | ICD-10-CM | POA: Insufficient documentation

## 2015-02-04 DIAGNOSIS — F17210 Nicotine dependence, cigarettes, uncomplicated: Secondary | ICD-10-CM | POA: Diagnosis not present

## 2015-02-04 DIAGNOSIS — Z8719 Personal history of other diseases of the digestive system: Secondary | ICD-10-CM | POA: Diagnosis not present

## 2015-02-04 DIAGNOSIS — Z8742 Personal history of other diseases of the female genital tract: Secondary | ICD-10-CM | POA: Insufficient documentation

## 2015-02-04 DIAGNOSIS — S6991XA Unspecified injury of right wrist, hand and finger(s), initial encounter: Secondary | ICD-10-CM | POA: Diagnosis present

## 2015-02-04 DIAGNOSIS — Y9389 Activity, other specified: Secondary | ICD-10-CM | POA: Insufficient documentation

## 2015-02-04 DIAGNOSIS — W208XXA Other cause of strike by thrown, projected or falling object, initial encounter: Secondary | ICD-10-CM | POA: Insufficient documentation

## 2015-02-04 DIAGNOSIS — Y9289 Other specified places as the place of occurrence of the external cause: Secondary | ICD-10-CM | POA: Diagnosis not present

## 2015-02-04 DIAGNOSIS — Y998 Other external cause status: Secondary | ICD-10-CM | POA: Diagnosis not present

## 2015-02-04 MED ORDER — HYDROCODONE-ACETAMINOPHEN 5-325 MG PO TABS: ORAL_TABLET | ORAL | Status: DC

## 2015-02-04 MED ORDER — HYDROCODONE-ACETAMINOPHEN 5-325 MG PO TABS
1.0000 | ORAL_TABLET | Freq: Once | ORAL | Status: AC
  Administered 2015-02-04: 1 via ORAL
  Filled 2015-02-04: qty 1

## 2015-02-04 NOTE — ED Notes (Signed)
Hand splinted per instructions. Sling given. D/c papers/prescriptions given and reviewed.

## 2015-02-04 NOTE — ED Notes (Signed)
Pt states that a piece off of some furniture fell on her right hand.
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
## 2015-02-05 ENCOUNTER — Encounter (HOSPITAL_COMMUNITY): Payer: Self-pay

## 2015-02-05 ENCOUNTER — Emergency Department (HOSPITAL_COMMUNITY)
Admission: EM | Admit: 2015-02-05 | Discharge: 2015-02-05 | Disposition: A | Discharge status: Home / Self Care | Payer: Medicaid Other | Attending: Emergency Medicine | Admitting: Emergency Medicine

## 2015-02-05 DIAGNOSIS — Z793 Long term (current) use of hormonal contraceptives: Secondary | ICD-10-CM | POA: Diagnosis not present

## 2015-02-05 DIAGNOSIS — Z008 Encounter for other general examination: Secondary | ICD-10-CM | POA: Insufficient documentation

## 2015-02-05 DIAGNOSIS — Z91040 Latex allergy status: Secondary | ICD-10-CM | POA: Insufficient documentation

## 2015-02-05 DIAGNOSIS — Z8742 Personal history of other diseases of the female genital tract: Secondary | ICD-10-CM | POA: Diagnosis not present

## 2015-02-05 DIAGNOSIS — Z79899 Other long term (current) drug therapy: Secondary | ICD-10-CM | POA: Diagnosis not present

## 2015-02-05 DIAGNOSIS — Z87828 Personal history of other (healed) physical injury and trauma: Secondary | ICD-10-CM | POA: Diagnosis not present

## 2015-02-05 DIAGNOSIS — Z8719 Personal history of other diseases of the digestive system: Secondary | ICD-10-CM | POA: Diagnosis not present

## 2015-02-05 DIAGNOSIS — F17210 Nicotine dependence, cigarettes, uncomplicated: Secondary | ICD-10-CM | POA: Diagnosis not present

## 2015-02-05 DIAGNOSIS — Z760 Encounter for issue of repeat prescription: Secondary | ICD-10-CM | POA: Diagnosis present

## 2015-02-05 DIAGNOSIS — Z139 Encounter for screening, unspecified: Secondary | ICD-10-CM
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
## 2015-02-05 NOTE — ED Notes (Signed)
[place] [employer] reports that a call for suspicious activity was made on 1/9, no called or incident reports have been made on this patient or her address for today.
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
## 2015-02-23 ENCOUNTER — Emergency Department (HOSPITAL_COMMUNITY)
Admission: EM | Admit: 2015-02-23 | Discharge: 2015-02-23 | Disposition: A | Discharge status: Home / Self Care | Payer: Medicaid Other | Attending: Emergency Medicine | Admitting: Emergency Medicine

## 2015-02-23 ENCOUNTER — Encounter (HOSPITAL_COMMUNITY): Payer: Self-pay

## 2015-02-23 DIAGNOSIS — Z79899 Other long term (current) drug therapy: Secondary | ICD-10-CM | POA: Diagnosis not present

## 2015-02-23 DIAGNOSIS — K0889 Other specified disorders of teeth and supporting structures: Secondary | ICD-10-CM | POA: Diagnosis present

## 2015-02-23 DIAGNOSIS — Z8719 Personal history of other diseases of the digestive system: Secondary | ICD-10-CM | POA: Diagnosis not present

## 2015-02-23 DIAGNOSIS — Z8742 Personal history of other diseases of the female genital tract: Secondary | ICD-10-CM | POA: Insufficient documentation

## 2015-02-23 DIAGNOSIS — T85848D Pain due to other internal prosthetic devices, implants and grafts, subsequent encounter: Secondary | ICD-10-CM | POA: Diagnosis not present

## 2015-02-23 DIAGNOSIS — Z91040 Latex allergy status: Secondary | ICD-10-CM | POA: Diagnosis not present

## 2015-02-23 DIAGNOSIS — Y658 Other specified misadventures during surgical and medical care: Secondary | ICD-10-CM | POA: Insufficient documentation

## 2015-02-23 DIAGNOSIS — F17210 Nicotine dependence, cigarettes, uncomplicated: Secondary | ICD-10-CM | POA: Diagnosis not present

## 2015-02-23 NOTE — ED Provider Notes (Signed)
CSN: [id number]     Arrival date & time 02/23/15  1123 History  By signing my name below, I, Emmanuella Mensah, attest that this documentation has been prepared under the direction and in the presence of [facility], PA-C. Electronically Signed: Angelene Giovanni, ED Scribe. 02/23/2015. 12:14 PM.    Chief Complaint  Patient presents with  . Dental Pain   The history is provided by the patient. No language interpreter was used.    HPI Comments: Joyce Ward is a 30 y.o. female who presents to the Emergency Department complaining of gradually worsening moderate left lower dental pain onset 2 weeks ago. She reports associated difficulty chewing and difficulty sleeping secondary to pain. She states that she has been taking Tylenol and Ibuprofen with no relief. Pt has an upcoming appointment with a dentist on 02/25/15 for a teeth removal on 02/27/15. She denies any fever, difficulty breathing, trouble swallowing, or facial swelling. She has an active care plan and has presented to ED several times for pain complaints.    Dentist: Dr. Teola Bradley   Past Medical History  Diagnosis Date  . Endometriosis   . GERD (gastroesophageal reflux disease)   . Pelvic pain in female 08/19/2014  . History of endometriosis 08/19/2014  . Irregular intermenstrual bleeding 08/19/2014   Past Surgical History  Procedure Laterality Date  . Laparoscopy     Family History  Problem Relation Age of Onset  . Endometriosis Mother    Social History  Substance Use Topics  . Smoking status: Current Some Day Smoker -- 1.00 packs/day for 5 years    Types: Cigarettes  . Smokeless tobacco: Never Used  . Alcohol Use: Yes     Comment: occasional   OB History    Gravida Para Term Preterm AB TAB SAB Ectopic Multiple Living   Review of Systems  Constitutional: Negative for fever.  HENT: Positive for dental problem (left lower). Negative for facial swelling and trouble swallowing.   Respiratory:  Negative for shortness of breath.       Allergies  Codeine; Latex; Naproxen; Toradol; and Tramadol  Home Medications   Prior to Admission medications   Medication Sig Start Date End Date Taking? Authorizing Provider  acetaminophen (TYLENOL) 500 MG tablet Take 1,000 mg by mouth every 6 (six) hours as needed for moderate pain.    Historical Provider, MD  albuterol (PROVENTIL HFA;VENTOLIN HFA) 108 (90 BASE) MCG/ACT inhaler Inhale 1-2 puffs into the lungs every 6 (six) hours as needed for wheezing or shortness of breath. Patient not taking: Reported on 12/09/2014 10/22/14   Ivery Quale, PA-C  HYDROcodone-acetaminophen (NORCO/VICODIN) 5-325 MG tablet Take one-two tabs po q 4-6 hrs prn pain 02/04/15   Tammy Triplett, PA-C  ibuprofen (ADVIL,MOTRIN) 800 MG tablet Take 1 tablet (800 mg total) by mouth 3 (three) times daily. Patient not taking: Reported on 02/04/2015 01/26/15   Elson Areas, PA-C  megestrol (MEGACE) 40 MG tablet Take 1 tablet (40 mg total) by mouth 3 (three) times daily. Patient not taking: Reported on 01/16/2015 12/18/14   Tilda Burrow, MD  methocarbamol (ROBAXIN) 500 MG tablet Take 1 tablet (500 mg total) by mouth 2 (two) times daily. Patient not taking: Reported on 02/04/2015 01/26/15   Elson Areas, PA-C  norethindrone-ethinyl estradiol (JUNEL FE 1/20) 1-20 MG-MCG tablet Take 1 tablet by mouth daily. 12/09/14   Tilda Burrow, MD  oxyCODONE-acetaminophen (PERCOCET) 5-325 MG tablet Take  1-2 tablets by mouth every 4 (four) hours as needed. Patient not taking: Reported on 01/26/2015 01/22/15   Mancel Bale, MD   BP 104/71 mmHg  Pulse 104  Temp(Src) 98.4 F (36.9 C) (Oral)  Resp 16  SpO2 100% Physical Exam  Constitutional: She is oriented to person, place, and time. She appears well-developed and well-nourished.  HENT:  Head: Normocephalic and atraumatic.  Mouth/Throat:    Pain along teeth as depicted in image. No signs of inflammation or infection. midline uvula, no  trismus, oropharynx moist and clear, no abscess noted, no oropharyngeal erythema or edema, neck supple and no tenderness. No facial edema  Cardiovascular: Normal rate, regular rhythm and normal heart sounds.   Pulmonary/Chest: Effort normal and breath sounds normal. No respiratory distress. She has no wheezes. She has no rales.  Abdominal: Soft. She exhibits no distension. There is no tenderness.  Neurological: She is alert and oriented to person, place, and time.  Skin: Skin is warm and dry.  Psychiatric: She has a normal mood and affect.  Nursing note and vitals reviewed.   ED Course  Procedures (including critical care time) DIAGNOSTIC STUDIES: Oxygen Saturation is 100% on RA, normal by my interpretation.    COORDINATION OF CARE: 12:11 PM- Explained that presentation does not warrant an antibiotics. Will consult previous visits and upcoming plan for a plan for treatment.   MDM  Per database, pt has received 9 pain medication prescriptions since September and her most recent prescription was on 02/04/15.   Patient with toothache.  No gross abscess.  Exam unconcerning for Ludwig's angina or spread of infection.  Will treat with penicillin and pain medicine.  Urged patient to follow-up with dentist.    Final diagnoses:  Dental implant pain, subsequent encounter   Periodontal Exam  Patient with dentalgia. No abscess requiring immediate incision and drainage. No signs of infection or inflammation. ABX not warranted at this time. Patient is afebrile, non toxic appearing, and swallowing secretions well. Exam not concerning for Ludwig's angina or pharyngeal abscess. [place] controlled substance database consulted showing patient has received 9 prescriptions for pain medication since September from multiple physicians in multiple cities. Will encourage PCP and dentist follow up and symptomatic care. Stressed follow up with dentist at scheduled appointment on 1/31.   I personally performed the  services described in this documentation, which was scribed in my presence. The recorded information has been reviewed and is accurate.  [facility], PA-C 02/23/15 1231  Arby Barrette, MD 03/02/15 [phone]

## 2015-02-23 NOTE — ED Notes (Signed)
Pt verbalized understanding f/u instructions.

## 2015-02-23 NOTE — ED Notes (Signed)
Pt c/o dental x 2 weeks.  Pain score 10/10.  Pt reports taking OTC pain medication w/o relief.  Pt reports appointment w/ a dentist on 1/31 w/ teeth removal scheduled for 2/2.

## 2015-02-23 NOTE — Discharge Instructions (Signed)
Follow up with dentist or primary care provider for further management of your pain.  Return to ER as needed for new or worsening symptoms, any additional concerns.

## 2015-05-02 ENCOUNTER — Emergency Department (HOSPITAL_COMMUNITY)
Admission: EM | Admit: 2015-05-02 | Discharge: 2015-05-02 | Disposition: A | Discharge status: Home / Self Care | Payer: Medicaid Other | Source: Home / Self Care

## 2015-05-02 ENCOUNTER — Encounter (HOSPITAL_COMMUNITY): Payer: Self-pay | Admitting: Emergency Medicine

## 2015-05-02 DIAGNOSIS — R350 Frequency of micturition: Secondary | ICD-10-CM | POA: Insufficient documentation

## 2015-05-02 DIAGNOSIS — N390 Urinary tract infection, site not specified: Secondary | ICD-10-CM | POA: Insufficient documentation

## 2015-05-02 DIAGNOSIS — F17210 Nicotine dependence, cigarettes, uncomplicated: Secondary | ICD-10-CM

## 2015-05-02 DIAGNOSIS — G8929 Other chronic pain: Secondary | ICD-10-CM | POA: Diagnosis not present

## 2015-05-02 DIAGNOSIS — R102 Pelvic and perineal pain: Secondary | ICD-10-CM | POA: Insufficient documentation

## 2015-05-02 LAB — URINE MICROSCOPIC-ADD ON

## 2015-05-02 LAB — URINALYSIS, ROUTINE W REFLEX MICROSCOPIC
BILIRUBIN URINE: NEGATIVE
GLUCOSE, UA: NEGATIVE mg/dL
Ketones, ur: NEGATIVE mg/dL
NITRITE: POSITIVE — AB
PH: 6.5 (ref 5.0–8.0)
Protein, ur: 30 mg/dL — AB
SPECIFIC GRAVITY, URINE: 1.012 (ref 1.005–1.030)

## 2015-05-02 NOTE — ED Notes (Signed)
No answer for revitals

## 2015-05-02 NOTE — ED Notes (Signed)
Called for pt twice

## 2015-05-02 NOTE — ED Notes (Signed)
C/o pelvic pain and frequent urination x 3 days.  Denies nausea, vomiting, and diarrhea.

## 2015-05-03 ENCOUNTER — Emergency Department (HOSPITAL_COMMUNITY)
Admission: EM | Admit: 2015-05-03 | Discharge: 2015-05-03 | Disposition: A | Discharge status: Home / Self Care | Payer: Medicaid Other | Attending: Emergency Medicine | Admitting: Emergency Medicine

## 2015-05-03 ENCOUNTER — Encounter (HOSPITAL_COMMUNITY): Payer: Self-pay

## 2015-05-03 DIAGNOSIS — G8929 Other chronic pain: Secondary | ICD-10-CM

## 2015-05-03 DIAGNOSIS — N390 Urinary tract infection, site not specified: Secondary | ICD-10-CM

## 2015-05-03 DIAGNOSIS — R102 Pelvic and perineal pain: Secondary | ICD-10-CM

## 2015-05-03 LAB — POC URINE PREG, ED: Preg Test, Ur: NEGATIVE

## 2015-05-03 MED ORDER — CEPHALEXIN 500 MG PO CAPS: 500.0000 mg | ORAL_CAPSULE | Freq: Four times a day (QID) | ORAL | Status: DC

## 2015-05-03 MED ORDER — IBUPROFEN 800 MG PO TABS: 800.0000 mg | ORAL_TABLET | Freq: Three times a day (TID) | ORAL | Status: DC | PRN

## 2015-05-03 MED ORDER — PHENAZOPYRIDINE HCL 100 MG PO TABS
100.0000 mg | ORAL_TABLET | Freq: Once | ORAL | Status: AC
  Administered 2015-05-03: 100 mg via ORAL
  Filled 2015-05-03: qty 1

## 2015-05-03 MED ORDER — STERILE WATER FOR INJECTION IJ SOLN
INTRAMUSCULAR | Status: AC
  Administered 2015-05-03: 01:00:00
  Filled 2015-05-03: qty 10

## 2015-05-03 MED ORDER — PHENAZOPYRIDINE HCL 200 MG PO TABS: 200.0000 mg | ORAL_TABLET | Freq: Three times a day (TID) | ORAL | Status: DC | PRN

## 2015-05-03 MED ORDER — DIPHENHYDRAMINE HCL 25 MG PO TABS: 25.0000 mg | ORAL_TABLET | Freq: Every evening | ORAL | Status: DC | PRN

## 2015-05-03 MED ORDER — ACETAMINOPHEN 500 MG PO TABS
1000.0000 mg | ORAL_TABLET | Freq: Once | ORAL | Status: AC
Start: 2015-05-03 — End: 2015-05-03
  Administered 2015-05-03: 1000 mg via ORAL
  Filled 2015-05-03: qty 2

## 2015-05-03 MED ORDER — CEFTRIAXONE SODIUM 1 G IJ SOLR
1.0000 g | Freq: Once | INTRAMUSCULAR | Status: AC
  Administered 2015-05-03: 1 g via INTRAMUSCULAR
  Filled 2015-05-03: qty 10
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
## 2015-05-03 NOTE — ED Notes (Signed)
Pt reports pain in her lower abd that she states is similar to her pain with endometriosis.  Pt states she is also now having frequency with urination and feels like she may have a uti.
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
## 2015-05-03 NOTE — Discharge Instructions (Signed)
Urinary Tract Infection Urinary tract infections (UTIs) can develop anywhere along your urinary tract. Your urinary tract is your body's drainage system for removing wastes and extra water. Your urinary tract includes two kidneys, two ureters, a bladder, and a urethra. Your kidneys are a pair of bean-shaped organs. Each kidney is about the size of your fist. They are located below your ribs, one on each side of your spine. CAUSES Infections are caused by microbes, which are microscopic organisms, including fungi, viruses, and bacteria. These organisms are so small that they can only be seen through a microscope. Bacteria are the microbes that most commonly cause UTIs. SYMPTOMS  Symptoms of UTIs may vary by age and gender of the patient and by the location of the infection. Symptoms in young women typically include a frequent and intense urge to urinate and a painful, burning feeling in the bladder or urethra during urination. Older women and men are more likely to be tired, shaky, and weak and have muscle aches and abdominal pain. A fever may mean the infection is in your kidneys. Other symptoms of a kidney infection include pain in your back or sides below the ribs, nausea, and vomiting. DIAGNOSIS To diagnose a UTI, your caregiver will ask you about your symptoms. Your caregiver will also ask you to provide a urine sample. The urine sample will be tested for bacteria and white blood cells. White blood cells are made by your body to help fight infection. TREATMENT  Typically, UTIs can be treated with medication. Because most UTIs are caused by a bacterial infection, they usually can be treated with the use of antibiotics. The choice of antibiotic and length of treatment depend on your symptoms and the type of bacteria causing your infection. HOME CARE INSTRUCTIONS  If you were prescribed antibiotics, take them exactly as your caregiver instructs you. Finish the medication even if you feel better after  you have only taken some of the medication.  Drink enough water and fluids to keep your urine clear or pale yellow.  Avoid caffeine, tea, and carbonated beverages. They tend to irritate your bladder.  Empty your bladder often. Avoid holding urine for long periods of time.  Empty your bladder before and after sexual intercourse.  After a bowel movement, women should cleanse from front to back. Use each tissue only once. SEEK MEDICAL CARE IF:   You have back pain.  You develop a fever.  Your symptoms do not begin to resolve within 3 days. SEEK IMMEDIATE MEDICAL CARE IF:   You have severe back pain or lower abdominal pain.  You develop chills.  You have nausea or vomiting.  You have continued burning or discomfort with urination. MAKE SURE YOU:   Understand these instructions.  Will watch your condition.  Will get help right away if you are not doing well or get worse.   This information is not intended to replace advice given to you by your health care provider. Make sure you discuss any questions you have with your health care provider.   Document Released: 10/21/2004 Document Revised: 10/02/2014 Document Reviewed: 02/19/2011 Elsevier Interactive Patient Education 2016 Elsevier Inc[employer]  Chronic Pain Chronic pain can be defined as pain that is off and on and lasts for 3-6 months or longer. Many things cause chronic pain, which can make it difficult to make a diagnosis. There are many treatment options available for chronic pain. However, finding a treatment that works well for you may require trying various approaches until the  right one is found. Many people benefit from a combination of two or more types of treatment to control their pain. SYMPTOMS  Chronic pain can occur anywhere in the body and can range from mild to very severe. Some types of chronic pain include:  Headache.  Low back pain.  Cancer pain.  Arthritis pain.  Neurogenic pain. This is pain resulting  from damage to nerves. People with chronic pain may also have other symptoms such as:  Depression.  Anger.  Insomnia.  Anxiety. DIAGNOSIS  Your health care provider will help diagnose your condition over time. In many cases, the initial focus will be on excluding possible conditions that could be causing the pain. Depending on your symptoms, your health care provider may order tests to diagnose your condition. Some of these tests may include:   Blood tests.   CT scan.   MRI.   X-rays.   Ultrasounds.   Nerve conduction studies.  You may need to see a specialist.  TREATMENT  Finding treatment that works well may take time. You may be referred to a pain specialist. He or she may prescribe medicine or therapies, such as:   Mindful meditation or yoga.  Shots (injections) of numbing or pain-relieving medicines into the spine or area of pain.  Local electrical stimulation.  Acupuncture.   Massage therapy.   Aroma, color, light, or sound therapy.   Biofeedback.   Working with a physical therapist to keep from getting stiff.   Regular, gentle exercise.   Cognitive or behavioral therapy.   Group support.  Sometimes, surgery may be recommended.  HOME CARE INSTRUCTIONS   Take all medicines as directed by your health care provider.   Lessen stress in your life by relaxing and doing things such as listening to calming music.   Exercise or be active as directed by your health care provider.   Eat a healthy diet and include things such as vegetables, fruits, fish, and lean meats in your diet.   Keep all follow-up appointments with your health care provider.   Attend a support group with others suffering from chronic pain. SEEK MEDICAL CARE IF:   Your pain gets worse.   You develop a new pain that was not there before.   You cannot tolerate medicines given to you by your health care provider.   You have new symptoms since your last visit  with your health care provider.  SEEK IMMEDIATE MEDICAL CARE IF:   You feel weak.   You have decreased sensation or numbness.   You lose control of bowel or bladder function.   Your pain suddenly gets much worse.   You develop shaking.  You develop chills.  You develop confusion.  You develop chest pain.  You develop shortness of breath.  MAKE SURE YOU:  Understand these instructions.  Will watch your condition.  Will get help right away if you are not doing well or get worse.   This information is not intended to replace advice given to you by your health care provider. Make sure you discuss any questions you have with your health care provider.   Document Released: 10/03/2001 Document Revised: 09/13/2012 Document Reviewed: 07/07/2012 Elsevier Interactive Patient Education [employer] Endometriosis Endometriosis is a condition in which the tissue that lines the uterus (endometrium) grows outside of its normal location. The tissue may grow in many locations close to the uterus, but it commonly grows on the ovaries, fallopian tubes, vagina, or bowel. Because the uterus  expels, or sheds, its lining every menstrual cycle, there is bleeding wherever the endometrial tissue is located. This can cause pain because blood is irritating to tissues not normally exposed to it.  CAUSES  The cause of endometriosis is not known.  SIGNS AND SYMPTOMS  Often, there are no symptoms. When symptoms are present, they can vary with the location of the displaced tissue. Various symptoms can occur at different times. Although symptoms occur mainly during a woman's menstrual period, they can also occur midcycle and usually stop with menopause. Some people may go months with no symptoms at all. Symptoms may include:   Back or abdominal pain.   Heavier bleeding during periods.   Pain during intercourse.   Painful bowel movements.   Infertility. DIAGNOSIS  Your health care  provider will do a physical exam and ask about your symptoms. Various tests may be done, such as:   Blood tests and urine tests. These are done to help rule out other problems.   Ultrasound. This test is done to look for abnormal tissue.   An X-ray of the lower bowel (barium enema).  Laparoscopy. In this procedure, a thin, lighted tube with a tiny camera on the end (laparoscope) is inserted into your abdomen. This helps your health care provider look for abnormal tissue to confirm the diagnosis. The health care provider may also remove a small piece of tissue (biopsy) from any abnormal tissue found. This tissue sample can then be sent to a lab so it can be looked at under a microscope. TREATMENT  Treatment will vary and may include:   Medicines to relieve pain. Nonsteroidal anti-inflammatory drugs (NSAIDs) are a type of pain medicine that can help to relieve the pain caused by endometriosis.  Hormonal therapy. When using hormonal therapy, periods are eliminated. This eliminates the monthly exposure to blood by the displaced endometrial tissue.   Surgery. Surgery may sometimes be done to remove the abnormal endometrial tissue. In severe cases, surgery may be done to remove the fallopian tubes, uterus, and ovaries (hysterectomy). HOME CARE INSTRUCTIONS   Take all medicines as directed by your health care provider. Do not take aspirin because it may increase bleeding when you are not on hormonal therapy.   Avoid activities that produce pain, including sexual activity. SEEK MEDICAL CARE IF:  You have pelvic pain before, after, or during your periods.  You have pelvic pain between periods that gets worse during your period.  You have pelvic pain during or after sex.  You have pelvic pain with bowel movements or urination, especially during your period.  You have problems getting pregnant.  You have a fever. SEEK IMMEDIATE MEDICAL CARE IF:   Your pain is severe and is not  responding to pain medicine.   You have severe nausea and vomiting, or you cannot keep foods down.   You have pain that is limited to the right lower part of your abdomen.   You have swelling or increasing pain in your abdomen.   You see blood in your stool.  MAKE SURE YOU:   Understand these instructions.  Will watch your condition.  Will get help right away if you are not doing well or get worse.   This information is not intended to replace advice given to you by your health care provider. Make sure you discuss any questions you have with your health care provider.   Document Released: 01/09/2000 Document Revised: 02/01/2014 Document Reviewed: 09/08/2012 Elsevier Interactive Patient Education [employer]  To find a primary care or specialty doctor please call [phone] or [phone] to access "[facility] Find a Doctor Service."  You may also go on the [facility] website at [email or web address]  There are also multiple [facility] practices throughout the [place] that are frequently accepting new patients. You may find a clinic that is close to your home and contact them.  [facility] -  [address] [place] [address] [phone]  [facility] in [place] (also locations in [place] and [place]) -  [address] [place] [address] [phone]  [facility] -  [address] [place] [address] [phone]    [email or web address] [address] [place], [place] [phone]    [facility] [email or web address] [address] [place], [place] [phone]    [facility] [address] [place], [place] [phone]   [facility] [email or web address] [address] [place], [place] [phone]    [facility] [email or web address] [address] [place], [place] [phone]   [email or web address] [address] [place], [place] [phone]

## 2015-05-03 NOTE — ED Notes (Signed)
During triage, pt states "i'm tired of this fucking pain and I need some goddamn pain medicine"    This nurse asked the patient to please not use such foul language at this time, and then jumped up off the bed and went out to the desk to tell everyone at the desk that this nurse was rude to her for asking her to not use foul language.  Pt spoke to Joyce Ward, [facility].

## 2015-05-30 ENCOUNTER — Encounter (HOSPITAL_COMMUNITY): Payer: Self-pay | Admitting: Emergency Medicine

## 2015-05-30 ENCOUNTER — Emergency Department (HOSPITAL_COMMUNITY): Payer: Medicaid Other

## 2015-05-30 ENCOUNTER — Emergency Department (HOSPITAL_COMMUNITY)
Admission: EM | Admit: 2015-05-30 | Discharge: 2015-05-31 | Disposition: A | Discharge status: Other Acute Inpatient Hospital | Payer: Medicaid Other | Attending: Emergency Medicine | Admitting: Emergency Medicine

## 2015-05-30 DIAGNOSIS — O9989 Other specified diseases and conditions complicating pregnancy, childbirth and the puerperium: Secondary | ICD-10-CM | POA: Diagnosis not present

## 2015-05-30 DIAGNOSIS — T1491XA Suicide attempt, initial encounter: Secondary | ICD-10-CM

## 2015-05-30 DIAGNOSIS — Z91040 Latex allergy status: Secondary | ICD-10-CM | POA: Insufficient documentation

## 2015-05-30 DIAGNOSIS — Z792 Long term (current) use of antibiotics: Secondary | ICD-10-CM | POA: Diagnosis not present

## 2015-05-30 DIAGNOSIS — F322 Major depressive disorder, single episode, severe without psychotic features: Secondary | ICD-10-CM

## 2015-05-30 DIAGNOSIS — Z3490 Encounter for supervision of normal pregnancy, unspecified, unspecified trimester: Secondary | ICD-10-CM

## 2015-05-30 DIAGNOSIS — O99611 Diseases of the digestive system complicating pregnancy, first trimester: Secondary | ICD-10-CM | POA: Diagnosis not present

## 2015-05-30 DIAGNOSIS — R079 Chest pain, unspecified: Secondary | ICD-10-CM | POA: Diagnosis not present

## 2015-05-30 DIAGNOSIS — O9A211 Injury, poisoning and certain other consequences of external causes complicating pregnancy, first trimester: Secondary | ICD-10-CM | POA: Diagnosis present

## 2015-05-30 DIAGNOSIS — F17210 Nicotine dependence, cigarettes, uncomplicated: Secondary | ICD-10-CM | POA: Insufficient documentation

## 2015-05-30 DIAGNOSIS — Z3A01 Less than 8 weeks gestation of pregnancy: Secondary | ICD-10-CM | POA: Diagnosis not present

## 2015-05-30 DIAGNOSIS — T39312A Poisoning by propionic acid derivatives, intentional self-harm, initial encounter: Secondary | ICD-10-CM | POA: Insufficient documentation

## 2015-05-30 DIAGNOSIS — Z793 Long term (current) use of hormonal contraceptives: Secondary | ICD-10-CM | POA: Diagnosis not present

## 2015-05-30 DIAGNOSIS — K219 Gastro-esophageal reflux disease without esophagitis: Secondary | ICD-10-CM | POA: Diagnosis not present

## 2015-05-30 DIAGNOSIS — Z791 Long term (current) use of non-steroidal anti-inflammatories (NSAID): Secondary | ICD-10-CM | POA: Insufficient documentation

## 2015-05-30 DIAGNOSIS — R102 Pelvic and perineal pain: Secondary | ICD-10-CM

## 2015-05-30 DIAGNOSIS — O99331 Smoking (tobacco) complicating pregnancy, first trimester: Secondary | ICD-10-CM | POA: Diagnosis not present

## 2015-05-30 DIAGNOSIS — Z79899 Other long term (current) drug therapy: Secondary | ICD-10-CM | POA: Insufficient documentation

## 2015-05-30 LAB — RAPID URINE DRUG SCREEN, HOSP PERFORMED
AMPHETAMINES: NOT DETECTED
Barbiturates: NOT DETECTED
Benzodiazepines: NOT DETECTED
COCAINE: NOT DETECTED
OPIATES: NOT DETECTED
TETRAHYDROCANNABINOL: NOT DETECTED

## 2015-05-30 LAB — BLOOD GAS, ARTERIAL
ACID-BASE DEFICIT: 4.6 mmol/L — AB (ref 0.0–2.0)
Bicarbonate: 18.5 mEq/L — ABNORMAL LOW (ref 20.0–24.0)
DRAWN BY: 422461
FIO2: 0.21
O2 SAT: 97.1 %
PATIENT TEMPERATURE: 98.6
PCO2 ART: 29.9 mmHg — AB (ref 35.0–45.0)
TCO2: 16.6 mmol/L (ref 0–100)
pH, Arterial: 7.409 (ref 7.350–7.450)
pO2, Arterial: 94 mmHg (ref 80.0–100.0)

## 2015-05-30 LAB — CBC
HCT: 43.4 % (ref 36.0–46.0)
Hemoglobin: 15.4 g/dL — ABNORMAL HIGH (ref 12.0–15.0)
MCH: 32.6 pg (ref 26.0–34.0)
MCHC: 35.5 g/dL (ref 30.0–36.0)
MCV: 91.9 fL (ref 78.0–100.0)
PLATELETS: 374 10*3/uL (ref 150–400)
RBC: 4.72 MIL/uL (ref 3.87–5.11)
RDW: 12.8 % (ref 11.5–15.5)
WBC: 10.7 10*3/uL — AB (ref 4.0–10.5)

## 2015-05-30 LAB — I-STAT BETA HCG BLOOD, ED (MC, WL, AP ONLY)

## 2015-05-30 LAB — COMPREHENSIVE METABOLIC PANEL
ALK PHOS: 76 U/L (ref 38–126)
ALT: 12 U/L — ABNORMAL LOW (ref 14–54)
ANION GAP: 13 (ref 5–15)
AST: 18 U/L (ref 15–41)
Albumin: 4.6 g/dL (ref 3.5–5.0)
BILIRUBIN TOTAL: 0.3 mg/dL (ref 0.3–1.2)
BUN: 8 mg/dL (ref 6–20)
CALCIUM: 9.6 mg/dL (ref 8.9–10.3)
CO2: 20 mmol/L — ABNORMAL LOW (ref 22–32)
Chloride: 111 mmol/L (ref 101–111)
Creatinine, Ser: 0.56 mg/dL (ref 0.44–1.00)
GFR calc non Af Amer: >60 mL/min (ref >60)
Glucose, Bld: 89 mg/dL (ref 65–99)
POTASSIUM: 3.6 mmol/L (ref 3.5–5.1)
Sodium: 144 mmol/L (ref 135–145)
TOTAL PROTEIN: 8.2 g/dL — AB (ref 6.5–8.1)

## 2015-05-30 LAB — ACETAMINOPHEN LEVEL

## 2015-05-30 LAB — SALICYLATE LEVEL

## 2015-05-30 LAB — ETHANOL: ALCOHOL ETHYL (B): 193 mg/dL — AB (ref <5)

## 2015-05-30 LAB — ABO/RH: ABO/RH(D): B POS

## 2015-05-30 LAB — CBG MONITORING, ED: Glucose-Capillary: 107 mg/dL — ABNORMAL HIGH (ref 65–99)

## 2015-05-30 LAB — HCG, QUANTITATIVE, PREGNANCY: HCG, BETA CHAIN, QUANT, S: 20520 m[IU]/mL — AB (ref <5)

## 2015-05-30 MED ORDER — SODIUM CHLORIDE 0.9 % IV BOLUS (SEPSIS)
1000.0000 mL | Freq: Once | INTRAVENOUS | Status: AC
  Administered 2015-05-30: 1000 mL via INTRAVENOUS

## 2015-05-30 NOTE — ED Notes (Signed)
Per PC: repeat electrolyte level check in four hours with recheck of tylenol level. If there is no emesis she should be stable within 4hours. Check her bicarb and CO2 and get a UDS. Charcoal is not recommended.

## 2015-05-30 NOTE — ED Notes (Signed)
Per EMS, patient took 8800 mg of ibuprofen and 1 pint of vodka over a period of 2 hours. Patient is complaining of chest tightness and nausea. Patient is hyperventilating. This was an intentional overdose. Patient is SI. Hx of heroine use, but states she has not used anything in 6 months.

## 2015-05-30 NOTE — ED Notes (Signed)
Pt's father is visiting.  Father brought pt's purse.  Wanded by security.  Open pack of Marlboro cigarettes and 3 lighters placed in personal belongings bag and secured at the nurses station.

## 2015-05-30 NOTE — ED Provider Notes (Signed)
CSN: [id number]     Arrival date & time 05/30/15  1858 History   First MD Initiated Contact with Patient 05/30/15 1925     Chief Complaint  Patient presents with  . Ingestion  . Chest Pain  . Suicidal     (Consider location/radiation/quality/duration/timing/severity/associated sxs/prior Treatment) HPI   Patient brought in today after suicide attempt.  States she has made some bad decisions in her life, misses her kids, and has medical conditions "no one wants to help me."   States "I didn't see the point in living anymore" and "I tried to kill myself."  Reports she took 9 pills of  ibuprofen and was drinking alcohol prior to this.  Currently she feels "tired" and c/o burning in her chest and upper abdomen.  Has vomited 2-3 times, chalky white vomit.  Denies SOB, current nausea, urinary or vaginal symptoms.    Pt somewhat resistant to giving history because she states "I don't think you care. I know a lot of doctors just come for the paycheck."    Past Medical History  Diagnosis Date  . Endometriosis   . GERD (gastroesophageal reflux disease)   . Pelvic pain in female 08/19/2014  . History of endometriosis 08/19/2014  . Irregular intermenstrual bleeding 08/19/2014   Past Surgical History  Procedure Laterality Date  . Laparoscopy     Family History  Problem Relation Age of Onset  . Endometriosis Mother    Social History  Substance Use Topics  . Smoking status: Current Some Day Smoker -- 1.00 packs/day for 5 years    Types: Cigarettes  . Smokeless tobacco: Never Used  . Alcohol Use: Yes     Comment: occasional   OB History    Gravida Para Term Preterm AB TAB SAB Ectopic Multiple Living   Review of Systems  Unable to perform ROS: Psychiatric disorder      Allergies  Codeine; Latex; Naproxen; Toradol; and Tramadol  Home Medications   Prior to Admission medications   Medication Sig Start Date End Date Taking? Authorizing Provider   acetaminophen (TYLENOL) 500 MG tablet Take 1,000 mg by mouth every 6 (six) hours as needed for moderate pain.    Historical Provider, MD  cephALEXin (KEFLEX) 500 MG capsule Take 1 capsule (500 mg total) by mouth 4 (four) times daily. 05/03/15   Kristen N Ward, DO  diphenhydrAMINE (BENADRYL) 25 MG tablet Take 1-2 tablets (25-50 mg total) by mouth at bedtime as needed for sleep. 05/03/15   Kristen N Ward, DO  ibuprofen (ADVIL,MOTRIN) 800 MG tablet Take 1 tablet (800 mg total) by mouth every 8 (eight) hours as needed for mild pain. 05/03/15   Kristen N Ward, DO  norethindrone-ethinyl estradiol (JUNEL FE 1/20) 1-20 MG-MCG tablet Take 1 tablet by mouth daily. 12/09/14   Tilda Burrow, MD  phenazopyridine (PYRIDIUM) 200 MG tablet Take 1 tablet (200 mg total) by mouth 3 (three) times daily as needed for pain. 05/03/15   Kristen N Ward, DO   BP 116/66 mmHg  Pulse 132  Resp 22  Ht  (1.575 m)  Wt 54.432 kg  BMI 21.94 kg/m2  SpO2 100%  LMP 05/12/2015 Physical Exam  Constitutional: She appears well-developed and well-nourished. No distress.  HENT:  Head: Normocephalic and atraumatic.  Neck: Neck supple.  Cardiovascular: Normal rate and regular rhythm.   Pulmonary/Chest: Effort normal and breath sounds normal. No respiratory distress. She has  no wheezes. She has no rales.  Abdominal: Soft. She exhibits no distension. There is tenderness (epigastric). There is no rebound and no guarding.  Neurological: She is alert.  Skin: She is not diaphoretic.  Psychiatric: Her affect is angry. She is withdrawn. She exhibits a depressed mood. She expresses suicidal ideation. She expresses suicidal plans.  Nursing note and vitals reviewed.   ED Course  Procedures (including critical care time) Labs Review Labs Reviewed  COMPREHENSIVE METABOLIC PANEL - Abnormal; Notable for the following:    CO2 20 (*)    Total Protein 8.2 (*)    ALT 12 (*)    All other components within normal limits  ETHANOL - Abnormal;  Notable for the following:    Alcohol, Ethyl (B) 193 (*)    All other components within normal limits  ACETAMINOPHEN LEVEL - Abnormal; Notable for the following:    Acetaminophen (Tylenol), Serum <10 (*)    All other components within normal limits  CBC - Abnormal; Notable for the following:    WBC 10.7 (*)    Hemoglobin 15.4 (*)    All other components within normal limits  BLOOD GAS, ARTERIAL - Abnormal; Notable for the following:    pCO2 arterial 29.9 (*)    Bicarbonate 18.5 (*)    Acid-base deficit 4.6 (*)    All other components within normal limits  HCG, QUANTITATIVE, PREGNANCY - Abnormal; Notable for the following:    hCG, Beta Chain, Quant, S 20520 (*)    All other components within normal limits  CBG MONITORING, ED - Abnormal; Notable for the following:    Glucose-Capillary 107 (*)    All other components within normal limits  I-STAT BETA HCG BLOOD, ED ([facility], [facility], [facility] ONLY) - Abnormal; Notable for the following:    I-stat hCG, quantitative >2000.0 (*)    All other components within normal limits  SALICYLATE LEVEL  URINE RAPID DRUG SCREEN, HOSP PERFORMED  ABO/RH    Imaging Review No results found. I have personally reviewed and evaluated these images and lab results as part of my medical decision-making.   EKG Interpretation None       Please see nursing note regarding Poison Control recommendations.    MDM   Final diagnoses:  Suicide attempt [facility](HCC)  Pregnant    Pt with hx chronic pain presents after suicide attempt.  OD on ibuprofen 7200mg  and vodka.  She is incidentally found to be pregnant.  She has had some epigastric burning, no vomiting.  ETOH 193.  Pelvic US and workup for early pregnancy pending at change of shift.  Continued monitoring with IVF and recheck of labs at 4 hours also pending at change of shift.  Signed out to TXU CorpHannah Muthersbaugh, PA-C, who assumes care at change of shift.  She is currently voluntary.  If medically cleared, will need call to TTS  for evaluation.       Trixie Dredgemily Jye Fariss, PA-C 05/30/15 2131  Lorre NickAnthony Allen, MD 06/08/15 [phone]

## 2015-05-30 NOTE — ED Provider Notes (Signed)
Care assumed from [facility], [place].  Joyce QuinceOnnalee Ward is a 30 y.o. female presents with suicide attempt via 1pt of Vodka and 9 tabs of ibuprofen.  Poison control recommended fluids and recheck of tylenol level.  Pt also with + pregnancy test on screening labs.  Denies vaginal discharge.  Last sexual encounter was end of March.  Pt with chronic pelvic pain.  Denies vaginal discharge or bleeding.  Pt also c/o epigastric abd burning and burning in her chest.  ECG reassuring.   Physical Exam  BP 106/69 mmHg  Pulse 115  Resp 41  Ht 5\' 2"  (1.575 m)  Wt 54.432 kg  BMI 21.94 kg/m2  SpO2 100%  LMP 05/12/2015  Physical Exam   Face to face Exam:   General: Awake, tearful  HEENT: Atraumatic  Resp: Normal effort  Abd: Nondistended  Neuro:No focal weakness  Lymph: No adenopathy  Results for orders placed or performed during the hospital encounter of 05/30/15  Comprehensive metabolic panel  Result Value Ref Range   Sodium 144 135 - 145 mmol/L   Potassium 3.6 3.5 - 5.1 mmol/L   Chloride 111 101 - 111 mmol/L   CO2 20 (L) 22 - 32 mmol/L   Glucose, Bld 89 65 - 99 mg/dL   BUN 8 6 - 20 mg/dL   Creatinine, Ser 9.600.56 0.44 - 1.00 mg/dL   Calcium 9.6 8.9 - 45.410.3 mg/dL   Total Protein 8.2 (H) 6.5 - 8.1 g/dL   Albumin 4.6 3.5 - 5.0 g/dL   AST 18 15 - 41 U/L   ALT 12 (L) 14 - 54 U/L   Alkaline Phosphatase 76 38 - 126 U/L   Total Bilirubin 0.3 0.3 - 1.2 mg/dL   GFR calc non Af Amer >60 >60 mL/min   GFR calc Af Amer >60 >60 mL/min   Anion gap 13 5 - 15  Ethanol  Result Value Ref Range   Alcohol, Ethyl (B) 193 (H) <5 mg/dL  Salicylate level  Result Value Ref Range   Salicylate Lvl <4.0 2.8 - 30.0 mg/dL  Acetaminophen level  Result Value Ref Range   Acetaminophen (Tylenol), Serum <10 (L) 10 - 30 ug/mL  cbc  Result Value Ref Range   WBC 10.7 (H) 4.0 - 10.5 K/uL   RBC 4.72 3.87 - 5.11 MIL/uL   Hemoglobin 15.4 (H) 12.0 - 15.0 g/dL   HCT 09.843.4 11.936.0 - 14.746.0 %   MCV 91.9 78.0 - 100.0 fL   MCH  32.6 26.0 - 34.0 pg   MCHC 35.5 30.0 - 36.0 g/dL   RDW 82.912.8 56.211.5 - 13.015.5 %   Platelets 374 150 - 400 K/uL  Rapid urine drug screen (hospital performed)  Result Value Ref Range   Opiates NONE DETECTED NONE DETECTED   Cocaine NONE DETECTED NONE DETECTED   Benzodiazepines NONE DETECTED NONE DETECTED   Amphetamines NONE DETECTED NONE DETECTED   Tetrahydrocannabinol NONE DETECTED NONE DETECTED   Barbiturates NONE DETECTED NONE DETECTED  Blood gas, arterial  Result Value Ref Range   FIO2 0.21    pH, Arterial 7.409 7.350 - 7.450   pCO2 arterial 29.9 (L) 35.0 - 45.0 mmHg   pO2, Arterial 94.0 80.0 - 100.0 mmHg   Bicarbonate 18.5 (L) 20.0 - 24.0 mEq/L   TCO2 16.6 0 - 100 mmol/L   Acid-base deficit 4.6 (H) 0.0 - 2.0 mmol/L   O2 Saturation 97.1 %   Patient temperature 98.6    Collection site RIGHT RADIAL    Drawn by  [id number]    Sample type ARTERIAL DRAW    Allens test (pass/fail) PASS PASS  hCG, quantitative, pregnancy  Result Value Ref Range   hCG, Beta Chain, Quant, S 20520 (H) <5 mIU/mL  Acetaminophen level  Result Value Ref Range   Acetaminophen (Tylenol), Serum <10 (L) 10 - 30 ug/mL  Blood gas, arterial ([facility] & [facility] ONLY)  Result Value Ref Range   FIO2 0.21    pH, Arterial 7.381 7.350 - 7.450   pCO2 arterial 32.9 (L) 35.0 - 45.0 mmHg   pO2, Arterial 94.0 80.0 - 100.0 mmHg   Bicarbonate 19.0 (L) 20.0 - 24.0 mEq/L   TCO2 17.3 0 - 100 mmol/L   Acid-base deficit 4.7 (H) 0.0 - 2.0 mmol/L   O2 Saturation 97.0 %   Patient temperature 98.6    Collection site RIGHT RADIAL    Drawn by [id number]    Sample type ARTERIAL DRAW    Allens test (pass/fail) PASS PASS  CBG monitoring, ED  Result Value Ref Range   Glucose-Capillary 107 (H) 65 - 99 mg/dL  I-Stat beta hCG blood, ED  Result Value Ref Range   I-stat hCG, quantitative >2000.0 (H) <5 mIU/mL   Comment 3          ABO/Rh  Result Value Ref Range   ABO/RH(D) B POS    US Ob Comp Less 14 Wks  05/30/2015  CLINICAL DATA:  Pelvic pain for 5  days EXAM: OBSTETRIC <14 WK [place] AND TRANSVAGINAL OB US TECHNIQUE: Both transabdominal and transvaginal ultrasound examinations were performed for complete evaluation of the gestation as well as the maternal uterus, adnexal regions, and pelvic cul-de-sac. Transvaginal technique was performed to assess early pregnancy. COMPARISON:  None. FINDINGS: Intrauterine gestational sac: Single Yolk sac:  Yes Embryo:  None Cardiac Activity: None Heart Rate:   bpm MSD: 12  mm   6 w   0  d CRL:    mm    w    d                  [place] EDC: Subchorionic hemorrhage:  None visualized. Maternal uterus/adnexae: Normal ovaries.  Trace free pelvic fluid. IMPRESSION: Early intrauterine gestational sac with yolk sac, but not fetal pole or cardiac activity yet visualized. Recommend follow-up quantitative B-HCG levels and follow-up US in 14 days to confirm and assess viability. This recommendation follows SRU consensus guidelines: Diagnostic Criteria for Nonviable Pregnancy Early in the First Trimester. Malva Limes Med 2013; 956:2130-86. Electronically Signed   By: Ellery Plunk M.D.   On: 05/30/2015 22:33   US Ob Transvaginal  05/30/2015  CLINICAL DATA:  Pelvic pain for 5 days EXAM: OBSTETRIC <14 WK [place] AND TRANSVAGINAL OB US TECHNIQUE: Both transabdominal and transvaginal ultrasound examinations were performed for complete evaluation of the gestation as well as the maternal uterus, adnexal regions, and pelvic cul-de-sac. Transvaginal technique was performed to assess early pregnancy. COMPARISON:  None. FINDINGS: Intrauterine gestational sac: Single Yolk sac:  Yes Embryo:  None Cardiac Activity: None Heart Rate:   bpm MSD: 12  mm   6 w   0  d CRL:    mm    w    d                  [place] EDC: Subchorionic hemorrhage:  None visualized. Maternal uterus/adnexae: Normal ovaries.  Trace free pelvic fluid. IMPRESSION: Early intrauterine gestational sac with yolk sac, but not fetal pole or cardiac activity yet  visualized. Recommend follow-up quantitative  B-HCG levels and follow-up US in 14 days to confirm and assess viability. This recommendation follows SRU consensus guidelines: Diagnostic Criteria for Nonviable Pregnancy Early in the First Trimester. Malva Limes Med 2013; 161:0960-45. Electronically Signed   By: Ellery Plunk M.D.   On: 05/30/2015 22:33    <ECG>   ED Course  Procedures   1. Suicide attempt (HCC)   2. Pregnant    MDM  Plan: pending quant and [place] to verify IUP.  Recheck tylenol level in 4 hours.  If medically cleared TTS for SI.  Pt is currently voluntary but if she tries to leave will need IVC.    11:45PM Sounds shows IUP at 6 weeks 0 days.  No fetal pole or cardiac activity is visualized. Discussed this at length with patient. Potentially fetus is too small to visualize cardiac activity however concern for fetal demise.  Discussed potential for nonviable pregnancy. She continues to deny vaginal discharge or abdominal pain. Her abdomen is soft and nontender. She continues to be tearful. Discussed the importance of trending her hCG and repeat ultrasound in 14 days. I have asked her to alert the nurse if she develops abdominal pain or vaginal bleeding. She agrees to this.   1:15AM Repeat Tylenol level is negative.  She is well-appearing. She is medically cleared. TTS as consult added but will need to await morning evaluation for disposition.  She continues to be amenable to evaluation and treatment.  Dahlia Client Shelba Susi, PA-C 05/31/15 0500  Lorre Nick, MD 06/08/15 [phone]

## 2015-05-30 NOTE — ED Notes (Signed)
Pt pulled pulse ox off and threw emesis bag in the floor.  Pt continues to rock the stretcher and is agitated.  Pt has been encouraged to lay still and leave heart monitor on.

## 2015-05-30 NOTE — ED Notes (Signed)
PC called and recommended rechecking tylenol labs including ABG 0030

## 2015-05-31 ENCOUNTER — Encounter (HOSPITAL_COMMUNITY): Payer: Self-pay | Admitting: *Deleted

## 2015-05-31 ENCOUNTER — Inpatient Hospital Stay (HOSPITAL_COMMUNITY)
Admission: AD | Admit: 2015-05-31 | Discharge: 2015-06-02 | DRG: 885 | Disposition: A | Discharge status: Home / Self Care | Payer: MEDICAID | Source: Intra-hospital | Attending: Emergency Medicine | Admitting: Emergency Medicine

## 2015-05-31 DIAGNOSIS — R45851 Suicidal ideations: Secondary | ICD-10-CM | POA: Diagnosis not present

## 2015-05-31 DIAGNOSIS — Z3201 Encounter for pregnancy test, result positive: Secondary | ICD-10-CM | POA: Diagnosis present

## 2015-05-31 DIAGNOSIS — F322 Major depressive disorder, single episode, severe without psychotic features: Principal | ICD-10-CM | POA: Diagnosis present

## 2015-05-31 DIAGNOSIS — F329 Major depressive disorder, single episode, unspecified: Secondary | ICD-10-CM | POA: Diagnosis not present

## 2015-05-31 DIAGNOSIS — O9A211 Injury, poisoning and certain other consequences of external causes complicating pregnancy, first trimester: Secondary | ICD-10-CM | POA: Diagnosis not present

## 2015-05-31 DIAGNOSIS — F1010 Alcohol abuse, uncomplicated: Secondary | ICD-10-CM | POA: Diagnosis present

## 2015-05-31 DIAGNOSIS — R102 Pelvic and perineal pain: Secondary | ICD-10-CM

## 2015-05-31 DIAGNOSIS — Y906 Blood alcohol level of 120-199 mg/100 ml: Secondary | ICD-10-CM | POA: Diagnosis present

## 2015-05-31 DIAGNOSIS — F17210 Nicotine dependence, cigarettes, uncomplicated: Secondary | ICD-10-CM | POA: Diagnosis present

## 2015-05-31 LAB — BLOOD GAS, ARTERIAL
ACID-BASE DEFICIT: 4.7 mmol/L — AB (ref 0.0–2.0)
Bicarbonate: 19 mEq/L — ABNORMAL LOW (ref 20.0–24.0)
Drawn by: 422461
FIO2: 0.21
O2 SAT: 97 %
PATIENT TEMPERATURE: 98.6
PCO2 ART: 32.9 mmHg — AB (ref 35.0–45.0)
TCO2: 17.3 mmol/L (ref 0–100)
pH, Arterial: 7.381 (ref 7.350–7.450)
pO2, Arterial: 94 mmHg (ref 80.0–100.0)

## 2015-05-31 LAB — ACETAMINOPHEN LEVEL

## 2015-05-31 MED ORDER — ONDANSETRON HCL 4 MG PO TABS
4.0000 mg | ORAL_TABLET | Freq: Four times a day (QID) | ORAL | Status: DC | PRN
  Administered 2015-05-31: 4 mg via ORAL
  Filled 2015-05-31: qty 1

## 2015-05-31 MED ORDER — HYDROXYZINE HCL 25 MG PO TABS
25.0000 mg | ORAL_TABLET | Freq: Three times a day (TID) | ORAL | Status: DC | PRN
  Administered 2015-05-31: 25 mg via ORAL
  Filled 2015-05-31: qty 1

## 2015-05-31 MED ORDER — DIPHENHYDRAMINE HCL 25 MG PO CAPS
25.0000 mg | ORAL_CAPSULE | Freq: Every evening | ORAL | Status: DC | PRN
Start: 2015-05-31 — End: 2015-06-01
  Administered 2015-05-31: 25 mg via ORAL
  Filled 2015-05-31: qty 1

## 2015-05-31 MED ORDER — ONDANSETRON HCL 4 MG PO TABS: 4.0000 mg | ORAL_TABLET | Freq: Four times a day (QID) | ORAL | Status: DC | PRN

## 2015-05-31 MED ORDER — ACETAMINOPHEN 325 MG PO TABS
650.0000 mg | ORAL_TABLET | Freq: Once | ORAL | Status: AC
  Administered 2015-05-31: 650 mg via ORAL
  Filled 2015-05-31: qty 2

## 2015-05-31 MED ORDER — ACETAMINOPHEN 325 MG PO TABS
650.0000 mg | ORAL_TABLET | Freq: Four times a day (QID) | ORAL | Status: DC | PRN
  Administered 2015-05-31 – 2015-06-02 (×5): 650 mg via ORAL
  Filled 2015-05-31 (×5): qty 2

## 2015-05-31 NOTE — Clinical Social Work Note (Signed)
[facility] has a bed for pt today 305 bed 1.   Pt can go after 3pm today.  Elray Bubaegina Raena Pau, LCSW [facility] Clinical Social Worker - Weekend Coverage cell #: [phone]

## 2015-05-31 NOTE — ED Notes (Signed)
Dr nanavanti into see 

## 2015-05-31 NOTE — ED Notes (Signed)
Pt. C/o anxiety. 

## 2015-05-31 NOTE — ED Notes (Signed)
Dr Shyrl NumbersNanavanti updated and will eval.

## 2015-05-31 NOTE — ED Provider Notes (Signed)
Vistaril prn ordered for anxiety  Linwood DibblesJon Bjorn Hallas, MD 05/31/15 [phone]

## 2015-05-31 NOTE — ED Notes (Signed)
Ginger ale given for nausea.

## 2015-05-31 NOTE — ED Notes (Signed)
Up to the bathroom 

## 2015-05-31 NOTE — ED Notes (Signed)
Report called can transport in approx 45 mins

## 2015-05-31 NOTE — ED Notes (Signed)
Patient noted sleeping in room. No complaints, stable, in no acute distress. Q15 minute rounds and monitoring via Security Cameras to continue.  

## 2015-05-31 NOTE — Progress Notes (Signed)
Joyce Ward rates Anxiety 4/10 and Depression 0/10. She is very upset and tearful to learn of an unexpected/unplanned pregnancy on this admission. She is endorsing abdominal pain "cramping". However she states she cramped in her first trimester with her other 2 children who are ages 2 and 1. Her children are currently staying with her mother at this time. Denies SI/HI/AVH. Encouragement and support given. Medications administered as prescribed. Continue Q 15 minute checks for patient safety and medication effectiveness.

## 2015-05-31 NOTE — ED Notes (Signed)
Dr Shyrl NumbersNanavanti updated and will see, pt aware

## 2015-05-31 NOTE — ED Notes (Signed)
[facility]

## 2015-05-31 NOTE — ED Notes (Signed)
Some improvement w/ nausea

## 2015-05-31 NOTE — ED Notes (Signed)
Pt. Transferred to [facility] from ED to room 34 . Report from RN. Pt. Oriented to unit including Q15 minute rounds as well as the security cameras for their protection. Patient is alert and oriented, warm and dry in no acute distress. Patient denies SI, HI, and AVH. Pt. Encouraged to let me know if needs arise.

## 2015-05-31 NOTE — Progress Notes (Signed)
Patient did not attend the evening speaker AA meeting. Pt was newly admitted to unit and reported planning on attending groups tomorrow.
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
## 2015-05-31 NOTE — ED Notes (Signed)
pt sleeping

## 2015-05-31 NOTE — ED Provider Notes (Signed)
Pt complains of abd pain.  Patient is pregnant. Ultrasound shows:  FINDINGS: Intrauterine gestational sac: Single  Yolk sac: Yes  Embryo: None  Cardiac Activity: None  Heart Rate:  bpm  MSD: 12 mm  6 w  0 d  CRL:  mm  w  d         US EDC:  Subchorionic hemorrhage: None visualized.  Maternal uterus/adnexae: Normal ovaries. Trace free pelvic fluid.  IMPRESSION: Early intrauterine gestational sac with yolk sac, but not fetal pole or cardiac activity yet visualized. Recommend follow-up quantitative B-HCG levels and follow-up US in 14 days to confirm and assess viability. This recommendation follows SRU consensus guidelines: Diagnostic Criteria for Nonviable Pregnancy Early in the First Trimester. Malva Limes Engl J Med 2013; 295:2841-32; 369:1443-51.    She reports that her pain started this morning after it had resolved and it is at 9/10. We will give tylenol and reassess.    Derwood KaplanAnkit Alexiz Sustaita, MD 05/31/15 [phone]

## 2015-05-31 NOTE — ED Notes (Signed)
Pt is asleep; RN notified and told not to wake.

## 2015-05-31 NOTE — ED Notes (Signed)
Reassess pain in 1 hr and notify EDP

## 2015-05-31 NOTE — ED Notes (Signed)
tts into see 

## 2015-05-31 NOTE — ED Notes (Signed)
Patient noted in room. Stable, in no acute distress. Q15 minute rounds and monitoring via Security Cameras to continue.  

## 2015-05-31 NOTE — ED Notes (Signed)
[facility] difficulty with [facility], belongings given to driver

## 2015-05-31 NOTE — Tx Team (Signed)
Initial Interdisciplinary Treatment Plan   PATIENT STRESSORS: Financial difficulties Marital or family conflict Substance abuse   PATIENT STRENGTHS: Ability for insight Average or above average intelligence Capable of independent living Communication skills   PROBLEM LIST: Problem List/Patient Goals Date to be addressed Date deferred Reason deferred Estimated date of resolution  At risk for suicide 05/31/2015  05/31/2015   D/C  Depression 05/31/2015  05/31/2015   D/C  Substance Abuse 05/31/2015  05/31/2015   D/C  "less stress" 05/31/2015  05/31/2015   D/C  "I want to go home so bad" 05/31/2015  05/31/2015   D/C                           DISCHARGE CRITERIA:  Ability to meet basic life and health needs Improved stabilization in mood, thinking, and/or behavior Motivation to continue treatment in a less acute level of care Need for constant or close observation no longer present Reduction of life-threatening or endangering symptoms to within safe limits  PRELIMINARY DISCHARGE PLAN: Attend 12-step recovery group Outpatient therapy Return to previous living arrangement Return to previous work or school arrangements  PATIENT/FAMIILY INVOLVEMENT: This treatment plan has been presented to and reviewed with the patient, Joyce Ward.  The patient and family have been given the opportunity to ask questions and make suggestions.  Larry SierrasMiddleton, Honour Schwieger P 05/31/2015, 9:06 PM

## 2015-05-31 NOTE — ED Notes (Signed)
[facility] contacted for transport 
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
## 2015-06-01 ENCOUNTER — Encounter (HOSPITAL_COMMUNITY): Payer: Self-pay | Admitting: Emergency Medicine

## 2015-06-01 DIAGNOSIS — F1010 Alcohol abuse, uncomplicated: Secondary | ICD-10-CM

## 2015-06-01 DIAGNOSIS — F322 Major depressive disorder, single episode, severe without psychotic features: Principal | ICD-10-CM

## 2015-06-01 LAB — URINALYSIS, ROUTINE W REFLEX MICROSCOPIC
BILIRUBIN URINE: NEGATIVE
Glucose, UA: NEGATIVE mg/dL
HGB URINE DIPSTICK: NEGATIVE
Leukocytes, UA: NEGATIVE
NITRITE: NEGATIVE
PH: 7 (ref 5.0–8.0)
Protein, ur: NEGATIVE mg/dL
Specific Gravity, Urine: 1.018 (ref 1.005–1.030)

## 2015-06-01 LAB — WET PREP, GENITAL
CLUE CELLS WET PREP: NONE SEEN
Sperm: NONE SEEN
TRICH WET PREP: NONE SEEN
Yeast Wet Prep HPF POC: NONE SEEN

## 2015-06-01 LAB — OB RESULTS CONSOLE GC/CHLAMYDIA: GC PROBE AMP, GENITAL: NEGATIVE

## 2015-06-01 MED ORDER — DIPHENHYDRAMINE HCL 25 MG PO CAPS
50.0000 mg | ORAL_CAPSULE | Freq: Every evening | ORAL | Status: DC | PRN
Start: 2015-06-01 — End: 2015-06-02
  Administered 2015-06-01: 50 mg via ORAL
  Filled 2015-06-01: qty 2

## 2015-06-01 NOTE — ED Provider Notes (Signed)
CSN: [id number]     Arrival date & time 06/01/15  1152 History   First MD Initiated Contact with Patient 06/01/15 1237     Chief Complaint  Patient presents with  . Abdominal Pain  . Pregnant      (Consider location/radiation/quality/duration/timing/severity/associated sxs/prior Treatment) HPI Comments: PT comes in from behavioral health with cc of abd pain.  Pt has endometriosis hx. She is in the inpatient behavioral health side currently. She was sent here for abd pain. Pt c/o of lower abd pain. She is known to have + pregnancy test with [place] that showed about [redacted] week gestation and no FHT. Pt aware of the [place] results and aware that she needs repeat US in 14 days with an OB team.  She has no uti like symptoms. She reports new Vaginal discharge. Pt has no bleeding. She denies any back pain. The pain is constant, not labor type pain.    ROS 10 Systems reviewed and are negative for acute change except as noted in the HPI.     Patient is a 30 y.o. female presenting with abdominal pain. The history is provided by the patient.  Abdominal Pain   Past Medical History  Diagnosis Date  . Endometriosis   . GERD (gastroesophageal reflux disease)   . Pelvic pain in female 08/19/2014  . History of endometriosis 08/19/2014  . Irregular intermenstrual bleeding 08/19/2014   Past Surgical History  Procedure Laterality Date  . Laparoscopy     Family History  Problem Relation Age of Onset  . Endometriosis Mother    Social History  Substance Use Topics  . Smoking status: Current Some Day Smoker -- 1.00 packs/day for 5 years    Types: Cigarettes  . Smokeless tobacco: Never Used  . Alcohol Use: Yes     Comment: occasional   OB History    Gravida Para Term Preterm AB TAB SAB Ectopic Multiple Living   Review of Systems  Gastrointestinal: Positive for abdominal pain.      Allergies  Codeine; Latex; Naproxen; Toradol; and Tramadol  Home Medications   Prior to  Admission medications   Not on File   BP 103/68 mmHg  Pulse 84  Temp(Src) 98.2 F (36.8 C) (Oral)  Resp 16  Ht 5' 3.78" (1.62 m)  Wt 120 lb (54.432 kg)  BMI 20.74 kg/m2  SpO2 98%  LMP 05/12/2015 Physical Exam  Constitutional: She is oriented to person, place, and time. She appears well-developed.  HENT:  Head: Normocephalic and atraumatic.  Eyes: Conjunctivae and EOM are normal. Pupils are equal, round, and reactive to light.  Neck: Normal range of motion. Neck supple.  Cardiovascular: Normal rate, regular rhythm, normal heart sounds and intact distal pulses.   No murmur heard. Pulmonary/Chest: Effort normal. No respiratory distress. She has no wheezes.  Abdominal: Soft. Bowel sounds are normal. She exhibits no distension. There is tenderness. There is no rebound and no guarding.  Lower quadrant tenderness  Genitourinary: Vagina normal and uterus normal.  External exam - normal, no lesions Speculum exam: Pt has some white discharge, no blood Bimanual exam: Patient has no CMT, no adnexal tenderness or fullness and cervical os is closed  Neurological: She is alert and oriented to person, place, and time.  Skin: Skin is warm and dry.  Nursing note and vitals reviewed.   ED Course  Procedures (including critical care time) Labs Review Labs Reviewed  WET  PREP, GENITAL - Abnormal; Notable for the following:    WBC, Wet Prep HPF POC FEW (*)    All other components within normal limits  URINALYSIS, ROUTINE W REFLEX MICROSCOPIC (NOT AT [facility]) - Abnormal; Notable for the following:    Ketones, ur >80 (*)    All other components within normal limits  URINE CULTURE  GC/CHLAMYDIA PROBE AMP ([place]) NOT AT [facility]    Imaging Review [place] Ob Comp Less 14 Wks  05/30/2015  CLINICAL DATA:  Pelvic pain for 5 days EXAM: OBSTETRIC <14 WK US AND TRANSVAGINAL OB US TECHNIQUE: Both transabdominal and transvaginal ultrasound examinations were performed for complete evaluation of the gestation as  well as the maternal uterus, adnexal regions, and pelvic cul-de-sac. Transvaginal technique was performed to assess early pregnancy. COMPARISON:  None. FINDINGS: Intrauterine gestational sac: Single Yolk sac:  Yes Embryo:  None Cardiac Activity: None Heart Rate:   bpm MSD: 12  mm   6 w   0  d CRL:    mm    w    d                  US EDC: Subchorionic hemorrhage:  None visualized. Maternal uterus/adnexae: Normal ovaries.  Trace free pelvic fluid. IMPRESSION: Early intrauterine gestational sac with yolk sac, but not fetal pole or cardiac activity yet visualized. Recommend follow-up quantitative B-HCG levels and follow-up US in 14 days to confirm and assess viability. This recommendation follows SRU consensus guidelines: Diagnostic Criteria for Nonviable Pregnancy Early in the First Trimester. Malva Limes Engl J Med 2013; [id number]; [id number]. Electronically Signed   By: Ellery Plunkaniel R Mitchell M.D.   On: 05/30/2015 22:33   [place] Ob Transvaginal  05/30/2015  CLINICAL DATA:  Pelvic pain for 5 days EXAM: OBSTETRIC <14 WK US AND TRANSVAGINAL OB US TECHNIQUE: Both transabdominal and transvaginal ultrasound examinations were performed for complete evaluation of the gestation as well as the maternal uterus, adnexal regions, and pelvic cul-de-sac. Transvaginal technique was performed to assess early pregnancy. COMPARISON:  None. FINDINGS: Intrauterine gestational sac: Single Yolk sac:  Yes Embryo:  None Cardiac Activity: None Heart Rate:   bpm MSD: 12  mm   6 w   0  d CRL:    mm    w    d                  US EDC: Subchorionic hemorrhage:  None visualized. Maternal uterus/adnexae: Normal ovaries.  Trace free pelvic fluid. IMPRESSION: Early intrauterine gestational sac with yolk sac, but not fetal pole or cardiac activity yet visualized. Recommend follow-up quantitative B-HCG levels and follow-up US in 14 days to confirm and assess viability. This recommendation follows SRU consensus guidelines: Diagnostic Criteria for Nonviable Pregnancy Early in the  First Trimester. Malva Limes Engl J Med 2013; [id number]; [id number]. Electronically Signed   By: Ellery Plunkaniel R Mitchell M.D.   On: 05/30/2015 22:33   I have personally reviewed and evaluated these images and lab results as part of my medical decision-making.   EKG Interpretation None      MDM   Final diagnoses:  Pelvic pain in female    Pt comes in with cc of pelvic pain. Her pain is lower quadrants. She has hx of endometriosis and is pregnant - ectopic ruled out. She has no vaginal bleeding and the pain is not similar to labor.   Abd exam is reassuring - no peritoneal signs. Vitals are stable and reassuring. Pelvic exam and UA sent - current  results are reassuring.  - Pt made aware of repeat US need in 14 days. - Since she is pregnant, we will keep her on Tylenol for pain control - 500 mg q 4-6 hours prn pain. - Recommended OB follow up for optimal pain control and repeat US in 14 days after the initial one. - Return to the ER if there is bleeding, worsening pain, inability to keep meds down. - Pt needs outpatient Women's clinic f/u information  Derwood Kaplan, MD 06/01/15 1610

## 2015-06-01 NOTE — ED Notes (Signed)
Pt cannot use restroom at this time, aware urine specimen is needed.  
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
## 2015-06-01 NOTE — Progress Notes (Signed)
D) Pt states that she is having pain and rated it an 8. States, "my cramps are increasing and I am having a discharge".  A) NP notified of patients symptoms and an order was received to send Pt over to the ED for evaluation of her cramping.  R) Pt was sent with a technician to the ED via [place].
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
## 2015-06-01 NOTE — Progress Notes (Signed)
D Pt [facility] at 1746 [facility]. She requests 2 tylenol for c/o abd pain " that's a 9" and these are given to her perMD order. This Clinical research associatewriter received report via pc and report states " all w/u was negative thus far". Pt states she was [facility] ' in [facility] weeks" to make sure everything is "OK". A Dr. Dub MikesLugo made aware pt has returned and she is in to see him. R Safety is in place.

## 2015-06-01 NOTE — [facility] Suicide Risk Assessment (Signed)
[facility] Family/Significant Other Suicide Prevention Education  Suicide Prevention Education:   Patient Refusal for Family/Significant Other Suicide Prevention Education: The patient has refused to provide written consent for family/significant other to be provided Family/Significant Other Suicide Prevention Education during admission and/or prior to discharge.  Physician notified.  CSW provided suicide prevention information with patient.    The suicide prevention education provided includes the following:  Suicide risk factors  Suicide prevention and interventions  National Suicide Hotline telephone number  [facility] assessment telephone number  [facility] Emergency Assistance 911  [facility] and/or Residential Mobile Crisis Unit telephone number   Jeanelle MallingChelsea Armiyah Capron, [place] 06/01/2015 9:31 AM

## 2015-06-01 NOTE — [facility] Group Notes (Signed)
[facility] Group Notes:  (Nursing/MHT/Case Management/Adjunct)  Date:  06/01/2015  Time:  12:50 PM  Type of Therapy:  Psychoeducational Skills  Participation Level:  Did Not Attend  Participation Quality:  Did not attend'  Affect:  did not attend  Cognitive:  did not attend  Insight:  None  Engagement in Group:  None  Modes of Intervention:  did not attend  Summary of Progress/Problems: Patient was invited to group but did not attend.   Joyce Ward E 06/01/2015, 12:50 PM 

## 2015-06-01 NOTE — [facility] Group Notes (Signed)
[facility] Group Notes: (Clinical Social Work)   06/01/2015      Type of Therapy:  Group Therapy   Participation Level:  Did Not Attend despite MHT prompting - entered the room briefly but did not stay   Ambrose MantleMareida Grossman-Orr, LCSW 06/01/2015, 12:21 PM

## 2015-06-01 NOTE — Progress Notes (Addendum)
D: Pt presents anxious in affect and mood. Pt negative for any SI/HI/AVH. Pt verbally contracts for safety. Pt complained of lower abdominal pain of a 8 out 10 with 10 being the highest. Pt given a heat pack to help alleviate pain. Pt also encouraged to rest.  A: Writer administered Benadryl to aid with pt's sleep, per MD orders. Continued support and availability as needed was extended to this pt. Staff continues to monitor pt with q7015min checks.  R: No adverse drug reactions noted. Pt receptive to treatment. Pt remains safe at this time.

## 2015-06-01 NOTE — [facility] Suicide Risk Assessment (Signed)
[facility]   Nursing information obtained from:  Patient Demographic factors:  Caucasian, Low socioeconomic status Current Mental Status:  Self-harm thoughts, Self-harm behaviors Loss Factors:  Financial problems / change in socioeconomic status Historical Factors:  NA Risk Reduction Factors:  Responsible for children under 28 years of age, Sense of responsibility to family, Employed, Living with another person, especially a relative, Positive social support  Total Time spent with patient: 45 minutes Principal Problem: Major depressive disorder, single episode, severe without psychotic features ([facility]) Diagnosis:   Patient Active Problem List   Diagnosis Date Noted  . Alcohol abuse [F10.10] 06/01/2015  . Major depressive disorder, single episode, severe without psychotic features ([facility]) [F32.2] 05/31/2015  . Opiate use [F11.90] 12/19/2014  . Dysmenorrhea [N94.6] 12/09/2014  . Allergic reaction caused by a drug [Z88.9] 10/02/2014  . Pelvic pain in female [R10.2] 08/19/2014  . History of endometriosis [Z87.42] 08/19/2014  . Irregular intermenstrual bleeding [N92.1] 08/19/2014   Subjective Data: see admission H and P  Continued Clinical Symptoms:  Alcohol Use Disorder Identification Test Final Score (AUDIT): 0 The "Alcohol Use Disorders Identification Test", Guidelines for Use in Primary Care, Second Edition.  World Science writerHealth Organization [facility](WHO). Score between 0-7:  no or low risk or alcohol related problems. Score between 8-15:  moderate risk of alcohol related problems. Score between 16-19:  high risk of alcohol related problems. Score 20 or above:  warrants further diagnostic evaluation for alcohol dependence and treatment.   CLINICAL FACTORS:   Depression:   Comorbid alcohol abuse/dependence Alcohol/Substance Abuse/Dependencies    Psychiatric Specialty Exam: ROS  Blood pressure 103/68, pulse 84, temperature 98.2 F (36.8 C), temperature source Oral,  resp. rate 16, height 5' 3.78" (1.62 m), weight 54.432 kg (120 lb), last menstrual period 05/12/2015, SpO2 98 %.Body mass index is 20.74 kg/(m^2).   COGNITIVE FEATURES THAT CONTRIBUTE TO RISK:  Closed-mindedness, Polarized thinking and Thought constriction (tunnel vision)    SUICIDE RISK:   Mild:  Suicidal ideation of limited frequency, intensity, duration, and specificity.  There are no identifiable plans, no associated intent, mild dysphoria and related symptoms, good self-control (both objective and subjective assessment), few other risk factors, and identifiable protective factors, including available and accessible social support.  PLAN OF CARE: see admission H and P  I certify that inpatient services furnished can reasonably be expected to improve the patient's condition.   Rachael FeeLUGO,Bernadine Melecio A, MD 06/01/2015, 6:46 PM

## 2015-06-01 NOTE — ED Notes (Addendum)
[facility] States she's pregnant (positive hcg done two days ago, above 2000) and is having left sided abdominal pain. Denies vaginal bleeding but is c/o vaginal discharge. Denies urinary symptoms, states her last normal BM was this morning. Call [facility] when she's up for D/C

## 2015-06-02 LAB — GC/CHLAMYDIA PROBE AMP (~~LOC~~) NOT AT ARMC
Chlamydia: NEGATIVE
NEISSERIA GONORRHEA: NEGATIVE

## 2015-06-02 MED ORDER — PRENATAL MULTIVITAMIN CH: 1.0000 | ORAL_TABLET | Freq: Every day | ORAL | Status: DC

## 2015-06-02 MED ORDER — PRENATAL MULTIVITAMIN CH
1.0000 | ORAL_TABLET | Freq: Every day | ORAL | Status: DC
  Administered 2015-06-02: 1 via ORAL
  Filled 2015-06-02 (×2): qty 1

## 2015-06-02 NOTE — BHH Suicide Risk Assessment (Addendum)
[facility] Discharge Suicide Risk Assessment   Principal Problem: Major depressive disorder, single episode, severe without psychotic features [facility]) Discharge Diagnoses:  Patient Active Problem List   Diagnosis Date Noted  . Alcohol abuse [F10.10] 06/01/2015  . Major depressive disorder, single episode, severe without psychotic features (HCC) [F32.2] 05/31/2015  . Opiate use [F11.90] 12/19/2014  . Dysmenorrhea [N94.6] 12/09/2014  . Allergic reaction caused by a drug [Z88.9] 10/02/2014  . Pelvic pain in female [R10.2] 08/19/2014  . History of endometriosis [Z87.42] 08/19/2014  . Irregular intermenstrual bleeding [N92.1] 08/19/2014    Total Time spent with patient: 30 minutes  Musculoskeletal: Strength & Muscle Tone: within normal limits Gait & Station: normal Patient leans: N/A  Psychiatric Specialty Exam: ROS at this time states abdominal discomfort has subsided , denies any vaginal bleeding , denies dysuria or urgency,  denies fever or chills   Blood pressure 111/70, pulse 88, temperature 98.3 F (36.8 C), temperature source Oral, resp. rate 16, height 5' 3.78" (1.62 m), weight 120 lb (54.432 kg), last menstrual period 05/12/2015, SpO2 98 %.Body mass index is 20.74 kg/(m^2).  General Appearance: Well Groomed  Eye Contact::  Good  Speech:  Normal Rate409  Volume:  Normal  Mood:  states she is feeling better, denies depression at this time  Affect:  Appropriate and reactive   Thought Process:  Linear  Orientation:  Full (Time, Place, and Person)  Thought Content:  denies hallucinations, no delusions , not internally preoccupied   Suicidal Thoughts:  No- denies any suicidal or self injurious ideations   Homicidal Thoughts:  No  Memory:  recent and remote grossly intact   Judgement:  improved  Insight: improving   Psychomotor Activity:  Normal  Concentration:  Good  Recall:  Good  Fund of Knowledge:Good  Language: Good  Akathisia:  Negative  Handed:  Right  AIMS (if indicated):      Assets:  Communication Skills Desire for Improvement Resilience  Sleep:  Number of Hours: 6.25  Cognition: WNL  ADL's:  Intact   Mental Status Per Nursing Assessment::   On Admission:  Self-harm thoughts, Self-harm behaviors  Demographic Factors:  30 year old  Single female   Loss Factors: Difficulty finding a job, financial difficulties,  family stressors   Historical Factors:   Risk Reduction Factors:   Sense of responsibility to family and Positive coping skills or problem solving skills  Continued Clinical Symptoms:  At this time patient is alert, attentive, well related, calm, no acute distress, mood is "OK", minimizes depression at this time, affect is reactive, full in range, no thought disorder, no SI or HI, no psychotic symptoms. At this time not on any standing psychiatric medications  Of note, patient recently  found out that she is pregnant - she went to ED yesterday for reports of abdominal discomfort, cramping . Had a recent Obstetric Ultrasound - [redacted] weeks gestation, no FHT. Patient was advised to follow up with her Obstetritian  , repeat US in 2 weeks . Today patient expresses awareness of above recommendations - she is  in no acute distress, abdominal discomfort has subsided, she states she thinks it may be related to GI symptoms, " gas ", but understands importance of following up with OB   Cognitive Features That Contribute To Risk:  No gross cognitive deficits noted upon discharge. Is alert , attentive, and oriented x 3   Suicide Risk:  Mild:  Suicidal ideation of limited frequency, intensity, duration, and specificity.  There are no identifiable  plans, no associated intent, mild dysphoria and related symptoms, good self-control (both objective and subjective assessment), few other risk factors, and identifiable protective factors, including available and accessible social support.  Follow-up Information    [facility]   Why:   Please schedule appt for 2 weeks from hospital discharge for follow-up.    Contact information:   [address] [place] [id number] [phone]      [facility] with [facility].   Why:  Walk in between 8am-9am Monday through Friday for hospital follow-up and assessment for counseling services. You currently have Medicaid through [employer]. [place], please look into getting Medicaid transferred.    Contact information:   [address]  [place], [place] [address] Phone: [phone] Fax: [phone]      Plan Of Care/Follow-up recommendations:  Activity:  as tolerated  Diet:  Regular Tests:  NA Other:  See below  Patient is requesting discharge and there are no current grounds for involuntary commitment  Patient is leaving unit in good spirits  Plans to return home   Plans to follow up with her  OB as above, repeat US, and agrees to go to ED if any worsening symptoms or vaginal bleeding - started on prenatal vitamin  Encouraged to maintain focus on sobriety, and to abstain from smoking .  Nehemiah MassedOBOS, Candee Hoon, MD 06/02/2015, 11:42 AM
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
## 2015-06-02 NOTE — Progress Notes (Signed)
Patient did attend half of the evening speaker AA meeting. Pt left out of group reporting cramps. Pt was given heat packs and returned to her room.

## 2015-06-02 NOTE — Plan of Care (Signed)
Problem: Alteration in mood & ability to function due to Goal: LTG-Pt reports reduction in suicidal thoughts (Patient reports reduction in suicidal thoughts and is able to verbalize a safety plan for whenever patient is feeling suicidal)  Outcome: Progressing Patient denies any suicidal thoughts this morning. She reports feeling very positive about her pending discharge.

## 2015-06-02 NOTE — Progress Notes (Addendum)
D). Pt. On unit wrapping herself in a blanket and interacting with peers in the dayroom. Affect bright. Denies SI/HI/AVH/. BM today. Does report pain of 4/10, in bilat. lower abdomen. On self assessment patient rates herdepression, anxiety and hopelessness all as 0/10.  A). Heat packs applied bilaterally. Continue to maintain safety on unit. R) Pain significantly decreased. Patient discharged to lobby at 12:05pm. Father driving her home. Denies SI/HI/Pain/AVH. Belongings sheet signed and all belongings sent home with patient. Discharge paperwork reviewed and patient verbalizes understanding of teaching. Patient is ambulatory.

## 2015-06-02 NOTE — Discharge Summary (Signed)
Physician Discharge Summary Note  Patient:  Joyce Ward is an 30 y.o., female MRN:  [id number] DOB:  1985/04/06 Patient phone:  [phone] (home)  Patient address:   [address] [place] [address],  Total Time spent with patient: Greater than 30 minutes  Date of Admission:  05/31/2015  Date of Discharge: 06-02-15  Reason for Admission: Suspected drug overdose.  Principal Problem: Major depressive disorder, single episode, severe without psychotic features [facility])  Discharge Diagnoses: Patient Active Problem List   Diagnosis Date Noted  . Alcohol abuse [F10.10] 06/01/2015  . Major depressive disorder, single episode, severe without psychotic features (HCC) [F32.2] 05/31/2015  . Opiate use [F11.90] 12/19/2014  . Dysmenorrhea [N94.6] 12/09/2014  . Allergic reaction caused by a drug [Z88.9] 10/02/2014  . Pelvic pain in female [R10.2] 08/19/2014  . History of endometriosis [Z87.42] 08/19/2014  . Irregular intermenstrual bleeding [N92.1] 08/19/2014   Past Psychiatric History: Major depression  Past Medical History:  Past Medical History  Diagnosis Date  . Endometriosis   . GERD (gastroesophageal reflux disease)   . Pelvic pain in female 08/19/2014  . History of endometriosis 08/19/2014  . Irregular intermenstrual bleeding 08/19/2014    Past Surgical History  Procedure Laterality Date  . Laparoscopy     Family History:  Family History  Problem Relation Age of Onset  . Endometriosis Mother    Family Psychiatric  History: See H&P  Social History:  History  Alcohol Use  . Yes    Comment: occasional     History  Drug Use No    Social History   Social History  . Marital Status: Single    Spouse Name: N/A  . Number of Children: N/A  . Years of Education: N/A   Social History Main Topics  . Smoking status: Current Some Day Smoker -- 1.00 packs/day for 5 years    Types: Cigarettes  . Smokeless tobacco: Never Used  . Alcohol Use: Yes   Comment: occasional  . Drug Use: No  . Sexual Activity: No   Other Topics Concern  . None   Social History Narrative   Hospital Course: 30 Y/O female who states "I did something stupid" took a lot of Ibuprofen. "Was in a lot of pain". Found out she was pregnant in the hospital. Single has 2 Children, 2 and 1. She initially minimizes denies but when the initial assessment was read to her she agree with a lot of what it says. She continues to endorse she does not want to hurt herself, she has her kids to take care of.    Joyce Ward's stay in this hospital was rather very brief. She came in to the hospital with suspected  suicide attempt by overdose. However, Joyce Ward reported on admission that she took too many Ibuprofen tablets because she was in a lot of pain. She came to find out while at the hospital that she was actually pregnant per HCG quantitative result. After admission assessment, her symptoms were evaluated, there were no medication regimen initiated. She was however enrolled in the group counseling sessions to learn coping skills that should help her after discharge to cope better with issues. However, Joyce Ward declines inpatient hospitalization. She adamantly asked to be discharged to her home to care for her 2 children. She presented no other significant pre-existing medical issues that required treatment & or monitoring.   At this time Joyce Ward is alert, attentive, well related, calm, no acute distress, mood is "OK", minimizes depression at this time, affect  is reactive, full in range, no thought disorder, no SI or HI, no psychotic symptoms. At this time not on any standing psychiatric medications. Of note, she recently found out that she is pregnant - she went to ED yesterday for complaintss of abdominal discomfort, cramping. Had a recent Obstetric Ultrasound - [redacted] weeks gestation, no FHT. She was advised to follow up with her Obstetritian, repeat US in 2 weeks . Today Joyce Ward expresses  awareness of above recommendations. She currently is in no acute distress, abdominal discomfort has subsided, she states she thinks it may be related to GI symptoms, " gas ", but understands the importance of following up with OB. She is currently being discharged to her home as requested. She was provided with a prescription for Prenatal Vitamins. She left [facility] with all personal belongings in no apparent distress. Transportation per father.  Physical Findings: AIMS: Facial and Oral Movements Muscles of Facial Expression: None, normal Lips and Perioral Area: None, normal Jaw: None, normal Tongue: None, normal,Extremity Movements Upper (arms, wrists, hands, fingers): None, normal Lower (legs, knees, ankles, toes): None, normal, Trunk Movements Neck, shoulders, hips: None, normal, Overall Severity Severity of abnormal movements (highest score from questions above): None, normal Incapacitation due to abnormal movements: None, normal Patient's awareness of abnormal movements (rate only patient's report): No Awareness, Dental Status Current problems with teeth and/or dentures?: No Does patient usually wear dentures?: No  CIWA:  CIWA-Ar Total: 1 COWS:  COWS Total Score: 4  Musculoskeletal: Strength & Muscle Tone: within normal limits Gait & Station: normal Patient leans: N/A  Psychiatric Specialty Exam: Review of Systems  Constitutional: Negative.   HENT: Negative.   Eyes: Negative.   Respiratory: Negative.   Cardiovascular: Negative.   Gastrointestinal: Negative.   Genitourinary: Negative.   Musculoskeletal: Negative.   Skin: Negative.   Neurological: Negative.   Endo/Heme/Allergies: Negative.   Psychiatric/Behavioral: Positive for depression (Stable) and substance abuse (Hx. Alcohol abuse). Negative for suicidal ideas, hallucinations and memory loss. The patient has insomnia (Stable). The patient is not nervous/anxious.     Blood pressure 111/70, pulse 88, temperature 98.3 F (36.8  C), temperature source Oral, resp. rate 16, height 5' 3.78" (1.62 m), weight 54.432 kg (120 lb), last menstrual period 05/12/2015, SpO2 98 %.Body mass index is 20.74 kg/(m^2).  See Md's SRA   Have you used any form of tobacco in the last 30 days? (Cigarettes, Smokeless Tobacco, Cigars, and/or Pipes): Yes  Has this patient used any form of tobacco in the last 30 days? (Cigarettes, Smokeless Tobacco, Cigars, and/or Pipes): No  Blood Alcohol level:  Lab Results  Component Value Date   ETH 193* 05/30/2015   Metabolic Disorder Labs:  No results found for: HGBA1C, MPG No results found for: PROLACTIN No results found for: CHOL, TRIG, HDL, CHOLHDL, VLDL, LDLCALC  See Psychiatric Specialty Exam and Suicide Risk Assessment completed by Attending Physician prior to discharge.  Discharge destination:  Home  Is patient on multiple antipsychotic therapies at discharge:  No   Has Patient had three or more failed trials of antipsychotic monotherapy by history:  No  Recommended Plan for Multiple Antipsychotic Therapies: NA    Medication List    TAKE these medications      Indication   prenatal multivitamin Tabs tablet  Take 1 tablet by mouth daily at 12 noon. For low Vitamin   Indication:  Vitamin Deficiency       Follow-up Information    Follow up with [facility].  Contact information:   [address] [place] [id number] [phone]     Follow-up recommendations:  Activity:  As tolerated Diet: As recommended by your primary care doctor. Keep all scheduled follow-up appointments as recommended.  Comments: Take all your medications as prescribed by your mental healthcare provider. Report any adverse effects and or reactions from your medicines to your outpatient provider promptly. Patient is instructed and cautioned to not engage in alcohol and or illegal drug use while on prescription medicines. In the event of worsening symptoms, patient is  instructed to call the crisis hotline, 911 and or go to the nearest ED for appropriate evaluation and treatment of symptoms. Follow-up with your primary care provider for your other medical issues, concerns and or health care needs.   Signed: Sanjuana KavaNwoko, Agnes I, NP, PMHNP-BC 06/02/2015, 10:11 AM   Patient seen, Suicide Assessment Completed.  Disposition Plan Reviewed

## 2015-06-02 NOTE — Progress Notes (Signed)
Recreation Therapy Notes  Date: 05.08.2017 Time: 9:30am Location: 300 [address] Group Room   Group Topic: Stress Management  Goal Area(s) Addresses:  Patient will actively participate in stress management techniques presented during session.   Behavioral Response: Did not attend.   Marykay Lexenise L Taysen Bushart, LRT/CTRS        Edison Nicholson L 06/02/2015 11:47 AM

## 2015-06-02 NOTE — Progress Notes (Signed)
  [facility] Discharge [facility] :  Will you be returning to the same living situation after discharge:  Yes,  home with father and kids At discharge, do you have transportation home?: Yes,  father coming at 1:30PM to transport patient home. Do you have the ability to pay for your medications: Yes,  [employer] medicaid  Release of information consent forms completed and submitted to medical records by CSW.  Patient to Follow up at: Follow-up Information    [facility].   Why:  Please schedule appt for 2 weeks from hospital discharge for follow-up.    Contact information:   [address] [place] [id number] [phone]      [facility] with [facility].   Why:  Walk in between 8am-9am Monday through Friday for hospital follow-up and assessment for counseling services. You currently have Medicaid through [employer]. [place], please look into getting Medicaid transferred.    Contact information:   [address]  [place], [place] [address] Phone: [phone] Fax: [phone]      Next level of care provider has access to [facility] Link:no  Safety Planning and Suicide Prevention discussed: Yes,  SPE completed with pt, as she declined to consent to family contact. SPI pamphlet and mobile crisis information provided to pt. She was encouraged to share this information with her support network, ask questions, and talk about any concerns relating to SPE.  Have you used any form of tobacco in the last 30 days? (Cigarettes, Smokeless Tobacco, Cigars, and/or Pipes): Yes  Has patient been referred to the Quitline?: Patient refused referral  Patient has been referred for addiction treatment: Yes  Smart, Nekoda Chock LCSW 06/02/2015, 10:18 AM

## 2015-06-03 LAB — URINE CULTURE: Culture: 8000 — AB

## 2015-06-04 ENCOUNTER — Inpatient Hospital Stay (HOSPITAL_COMMUNITY): Payer: Medicaid Other

## 2015-06-04 ENCOUNTER — Inpatient Hospital Stay (HOSPITAL_COMMUNITY)
Admission: AD | Admit: 2015-06-04 | Discharge: 2015-06-04 | Disposition: A | Discharge status: Home / Self Care | Payer: Medicaid Other | Source: Ambulatory Visit | Attending: Obstetrics and Gynecology | Admitting: Obstetrics and Gynecology

## 2015-06-04 ENCOUNTER — Encounter (HOSPITAL_COMMUNITY): Payer: Self-pay | Admitting: *Deleted

## 2015-06-04 DIAGNOSIS — R109 Unspecified abdominal pain: Secondary | ICD-10-CM

## 2015-06-04 DIAGNOSIS — F17210 Nicotine dependence, cigarettes, uncomplicated: Secondary | ICD-10-CM | POA: Insufficient documentation

## 2015-06-04 DIAGNOSIS — K219 Gastro-esophageal reflux disease without esophagitis: Secondary | ICD-10-CM | POA: Diagnosis not present

## 2015-06-04 DIAGNOSIS — O99331 Smoking (tobacco) complicating pregnancy, first trimester: Secondary | ICD-10-CM | POA: Insufficient documentation

## 2015-06-04 DIAGNOSIS — R102 Pelvic and perineal pain: Secondary | ICD-10-CM

## 2015-06-04 DIAGNOSIS — O26891 Other specified pregnancy related conditions, first trimester: Secondary | ICD-10-CM | POA: Insufficient documentation

## 2015-06-04 DIAGNOSIS — Z885 Allergy status to narcotic agent status: Secondary | ICD-10-CM | POA: Diagnosis not present

## 2015-06-04 DIAGNOSIS — R1010 Upper abdominal pain, unspecified: Secondary | ICD-10-CM | POA: Insufficient documentation

## 2015-06-04 DIAGNOSIS — Z3A01 Less than 8 weeks gestation of pregnancy: Secondary | ICD-10-CM | POA: Diagnosis not present

## 2015-06-04 DIAGNOSIS — O26899 Other specified pregnancy related conditions, unspecified trimester: Secondary | ICD-10-CM

## 2015-06-04 DIAGNOSIS — Z886 Allergy status to analgesic agent status: Secondary | ICD-10-CM | POA: Diagnosis not present

## 2015-06-04 DIAGNOSIS — R748 Abnormal levels of other serum enzymes: Secondary | ICD-10-CM

## 2015-06-04 DIAGNOSIS — Z91040 Latex allergy status: Secondary | ICD-10-CM | POA: Insufficient documentation

## 2015-06-04 DIAGNOSIS — R112 Nausea with vomiting, unspecified: Secondary | ICD-10-CM

## 2015-06-04 DIAGNOSIS — O210 Mild hyperemesis gravidarum: Secondary | ICD-10-CM | POA: Diagnosis not present

## 2015-06-04 LAB — URINALYSIS, ROUTINE W REFLEX MICROSCOPIC
GLUCOSE, UA: NEGATIVE mg/dL
Hgb urine dipstick: NEGATIVE
KETONES UR: 40 mg/dL — AB
Leukocytes, UA: NEGATIVE
Nitrite: NEGATIVE
PH: 8 (ref 5.0–8.0)
Protein, ur: 30 mg/dL — AB
Specific Gravity, Urine: 1.015 (ref 1.005–1.030)

## 2015-06-04 LAB — CBC
HCT: 41.4 % (ref 36.0–46.0)
Hemoglobin: 15.2 g/dL — ABNORMAL HIGH (ref 12.0–15.0)
MCH: 32.5 pg (ref 26.0–34.0)
MCHC: 36.7 g/dL — ABNORMAL HIGH (ref 30.0–36.0)
MCV: 88.5 fL (ref 78.0–100.0)
PLATELETS: 426 10*3/uL — AB (ref 150–400)
RBC: 4.68 MIL/uL (ref 3.87–5.11)
RDW: 13 % (ref 11.5–15.5)
WBC: 16 10*3/uL — AB (ref 4.0–10.5)

## 2015-06-04 LAB — COMPREHENSIVE METABOLIC PANEL
ALT: 12 U/L — AB (ref 14–54)
AST: 20 U/L (ref 15–41)
Albumin: 4.9 g/dL (ref 3.5–5.0)
Alkaline Phosphatase: 67 U/L (ref 38–126)
Anion gap: 12 (ref 5–15)
BUN: 11 mg/dL (ref 6–20)
CHLORIDE: 107 mmol/L (ref 101–111)
CO2: 21 mmol/L — AB (ref 22–32)
CREATININE: 0.65 mg/dL (ref 0.44–1.00)
Calcium: 10.2 mg/dL (ref 8.9–10.3)
GFR calc non Af Amer: >60 mL/min (ref >60)
Glucose, Bld: 108 mg/dL — ABNORMAL HIGH (ref 65–99)
POTASSIUM: 3.3 mmol/L — AB (ref 3.5–5.1)
SODIUM: 140 mmol/L (ref 135–145)
Total Bilirubin: 0.7 mg/dL (ref 0.3–1.2)
Total Protein: 8.6 g/dL — ABNORMAL HIGH (ref 6.5–8.1)

## 2015-06-04 LAB — RAPID URINE DRUG SCREEN, HOSP PERFORMED
Amphetamines: NOT DETECTED
BARBITURATES: NOT DETECTED
BENZODIAZEPINES: NOT DETECTED
COCAINE: NOT DETECTED
Opiates: NOT DETECTED
TETRAHYDROCANNABINOL: POSITIVE — AB

## 2015-06-04 LAB — URINE MICROSCOPIC-ADD ON
Bacteria, UA: NONE SEEN
RBC / HPF: NONE SEEN RBC/hpf (ref 0–5)
WBC, UA: NONE SEEN WBC/hpf (ref 0–5)

## 2015-06-04 LAB — AMYLASE: Amylase: 160 U/L — ABNORMAL HIGH (ref 28–100)

## 2015-06-04 LAB — ETHANOL: Alcohol, Ethyl (B): <5 mg/dL (ref <5)

## 2015-06-04 LAB — HCG, QUANTITATIVE, PREGNANCY: hCG, Beta Chain, Quant, S: 55656 m[IU]/mL — ABNORMAL HIGH (ref <5)

## 2015-06-04 LAB — LIPASE, BLOOD: Lipase: 84 U/L — ABNORMAL HIGH (ref 11–51)

## 2015-06-04 MED ORDER — FAMOTIDINE IN NACL 20-0.9 MG/50ML-% IV SOLN
20.0000 mg | Freq: Once | INTRAVENOUS | Status: AC
  Administered 2015-06-04: 20 mg via INTRAVENOUS
  Filled 2015-06-04: qty 50

## 2015-06-04 MED ORDER — METOCLOPRAMIDE HCL 10 MG PO TABS: 10.0000 mg | ORAL_TABLET | Freq: Three times a day (TID) | ORAL | Status: DC

## 2015-06-04 MED ORDER — METOCLOPRAMIDE HCL 5 MG/ML IJ SOLN
10.0000 mg | Freq: Once | INTRAMUSCULAR | Status: AC
  Administered 2015-06-04: 10 mg via INTRAVENOUS
  Filled 2015-06-04: qty 2

## 2015-06-04 MED ORDER — LACTATED RINGERS IV BOLUS (SEPSIS)
1000.0000 mL | Freq: Once | INTRAVENOUS | Status: AC
  Administered 2015-06-04: 1000 mL via INTRAVENOUS

## 2015-06-04 MED ORDER — PROMETHAZINE HCL 25 MG PO TABS: 25.0000 mg | ORAL_TABLET | Freq: Every evening | ORAL | Status: DC | PRN

## 2015-06-04 MED ORDER — RANITIDINE HCL 150 MG PO CAPS: 150.0000 mg | ORAL_CAPSULE | Freq: Two times a day (BID) | ORAL | Status: DC

## 2015-06-04 MED ORDER — ONDANSETRON HCL 4 MG/2ML IJ SOLN
4.0000 mg | Freq: Once | INTRAMUSCULAR | Status: AC
  Administered 2015-06-04: 4 mg via INTRAVENOUS
  Filled 2015-06-04: qty 2

## 2015-06-04 MED ORDER — FENTANYL CITRATE (PF) 100 MCG/2ML IJ SOLN
50.0000 ug | Freq: Once | INTRAMUSCULAR | Status: AC
Start: 2015-06-04 — End: 2015-06-04
  Administered 2015-06-04: 50 ug via INTRAVENOUS
  Filled 2015-06-04: qty 2

## 2015-06-04 NOTE — Discharge Instructions (Signed)

## 2015-06-04 NOTE — MAU Note (Signed)
Pt presents today by EMS for lower pelvic pain.  [facility] and taking 8-10 Motrin 600 mg each.  Pt denies vaginal bleeding.

## 2015-06-04 NOTE — MAU Note (Signed)
Pelvic pain started two days ago. Rates it a 10/10 denies VB/discharge.  Vomiting as well.  Cannot tolerate anything.

## 2015-06-04 NOTE — MAU Provider Note (Signed)
History     CSN: [id number]  Arrival date and time: 06/04/15 0945   First Provider Initiated Contact with Patient 06/04/15 1015      Chief Complaint  Patient presents with  . Pelvic Pain   HPI   [name] is a 30 y.o. female [id number] with a history of GERD, ?narcotic use,  here today with abdominal pain and vomiting. She has vomited every 20 minutes for the last 2 days.  Her upper abdominal pain and N/V worsened last night after eating a [employer] from [employer]. She has not taken anything for the pain or N/V   She attempted to eat some chicken noodle soup and vomited that prior to coming in.   She patient was recently discharged from behavior health after a suicide attempt; she took 3-4 ibuprofen and "they thought I was trying to kill myself" Per the chart review the patient took 9 tabs of ibuprofen.   Per the [place] drug database the patient has been given multiple narcotic RX's from various providers since 12/09/14  She denies vaginal bleeding   OB History    Gravida Para Term Preterm AB TAB SAB Ectopic Multiple Living   '4 2   1  1   2      ' Past Medical History  Diagnosis Date  . Endometriosis   . GERD (gastroesophageal reflux disease)   . Pelvic pain in female 08/19/2014  . History of endometriosis 08/19/2014  . Irregular intermenstrual bleeding 08/19/2014    Past Surgical History  Procedure Laterality Date  . Laparoscopy      Family History  Problem Relation Age of Onset  . Endometriosis Mother     Social History  Substance Use Topics  . Smoking status: Current Some Day Smoker -- 1.00 packs/day for 5 years    Types: Cigarettes  . Smokeless tobacco: Never Used  . Alcohol Use: Yes     Comment: occasional    Allergies:  Allergies  Allergen Reactions  . Codeine Hives and Swelling  . Latex Hives  . Naproxen Hives  . Toradol [Ketorolac Tromethamine] Hives  . Tramadol Hives    Prescriptions prior to admission  Medication Sig Dispense Refill Last  Dose  . Prenatal Vit-Fe Fumarate-FA (PRENATAL MULTIVITAMIN) TABS tablet Take 1 tablet by mouth daily at 12 noon. For low Vitamin 30 tablet 0    Results for orders placed or performed during the hospital encounter of 06/04/15 (from the past 48 hour(s))  CBC     Status: Abnormal   Collection Time: 06/04/15 10:35 AM  Result Value Ref Range   WBC 16.0 (H) 4.0 - 10.5 K/uL   RBC 4.68 3.87 - 5.11 MIL/uL   Hemoglobin 15.2 (H) 12.0 - 15.0 g/dL   HCT 41.4 36.0 - 46.0 %   MCV 88.5 78.0 - 100.0 fL   MCH 32.5 26.0 - 34.0 pg   MCHC 36.7 (H) 30.0 - 36.0 g/dL   RDW 13.0 11.5 - 15.5 %   Platelets 426 (H) 150 - 400 K/uL  Comprehensive metabolic panel     Status: Abnormal   Collection Time: 06/04/15 10:35 AM  Result Value Ref Range   Sodium 140 135 - 145 mmol/L   Potassium 3.3 (L) 3.5 - 5.1 mmol/L   Chloride 107 101 - 111 mmol/L   CO2 21 (L) 22 - 32 mmol/L   Glucose, Bld 108 (H) 65 - 99 mg/dL   BUN 11 6 - 20 mg/dL   Creatinine, Ser 0.65 0.44 - 1.00  mg/dL   Calcium 10.2 8.9 - 10.3 mg/dL   Total Protein 8.6 (H) 6.5 - 8.1 g/dL   Albumin 4.9 3.5 - 5.0 g/dL   AST 20 15 - 41 U/L   ALT 12 (L) 14 - 54 U/L   Alkaline Phosphatase 67 38 - 126 U/L   Total Bilirubin 0.7 0.3 - 1.2 mg/dL   GFR calc non Af Amer >60 >60 mL/min   GFR calc Af Amer >60 >60 mL/min    Comment: (NOTE) The eGFR has been calculated using the CKD EPI equation. This calculation has not been validated in all clinical situations. eGFR's persistently <60 mL/min signify possible Chronic Kidney Disease.    Anion gap 12 5 - 15  Amylase     Status: Abnormal   Collection Time: 06/04/15 10:35 AM  Result Value Ref Range   Amylase 160 (H) 28 - 100 U/L  Lipase, blood     Status: Abnormal   Collection Time: 06/04/15 10:35 AM  Result Value Ref Range   Lipase 84 (H) 11 - 51 U/L  Ethanol     Status: None   Collection Time: 06/04/15 10:35 AM  Result Value Ref Range   Alcohol, Ethyl (B) <5 <5 mg/dL    Comment:        LOWEST DETECTABLE LIMIT  FOR SERUM ALCOHOL IS 5 mg/dL FOR MEDICAL PURPOSES ONLY Performed at [facility]   hCG, quantitative, pregnancy     Status: Abnormal   Collection Time: 06/04/15 10:35 AM  Result Value Ref Range   hCG, Beta Chain, Quant, S 55656 (H) <5 mIU/mL    Comment:          GEST. AGE      CONC.  (mIU/mL)   <=1 WEEK        5 - 50     2 WEEKS       50 - 500     3 WEEKS       100 - 10,000     4 WEEKS     1,000 - 30,000     5 WEEKS     3,500 - 115,000   6-8 WEEKS     12,000 - 270,000    12 WEEKS     15,000 - 220,000        FEMALE AND NON-PREGNANT FEMALE:     LESS THAN 5 mIU/mL   Urine rapid drug screen (hosp performed)     Status: Abnormal   Collection Time: 06/04/15  1:00 PM  Result Value Ref Range   Opiates NONE DETECTED NONE DETECTED   Cocaine NONE DETECTED NONE DETECTED   Benzodiazepines NONE DETECTED NONE DETECTED   Amphetamines NONE DETECTED NONE DETECTED   Tetrahydrocannabinol POSITIVE (A) NONE DETECTED   Barbiturates NONE DETECTED NONE DETECTED    Comment:        DRUG SCREEN FOR MEDICAL PURPOSES ONLY.  IF CONFIRMATION IS NEEDED FOR ANY PURPOSE, NOTIFY LAB WITHIN 5 DAYS.        LOWEST DETECTABLE LIMITS FOR URINE DRUG SCREEN Drug Class       Cutoff (ng/mL) Amphetamine      1000 Barbiturate      200 Benzodiazepine   053 Tricyclics       976 Opiates          300 Cocaine          300 THC              50   Urinalysis, Routine  w reflex microscopic (not at [facility])     Status: Abnormal   Collection Time: 06/04/15  1:00 PM  Result Value Ref Range   Color, Urine YELLOW YELLOW   APPearance CLEAR CLEAR   Specific Gravity, Urine 1.015 1.005 - 1.030   pH 8.0 5.0 - 8.0   Glucose, UA NEGATIVE NEGATIVE mg/dL   Hgb urine dipstick NEGATIVE NEGATIVE   Bilirubin Urine SMALL (A) NEGATIVE   Ketones, ur 40 (A) NEGATIVE mg/dL   Protein, ur 30 (A) NEGATIVE mg/dL   Nitrite NEGATIVE NEGATIVE   Leukocytes, UA NEGATIVE NEGATIVE  Urine microscopic-add on     Status: Abnormal   Collection  Time: 06/04/15  1:00 PM  Result Value Ref Range   Squamous Epithelial / LPF 0-5 (A) NONE SEEN   WBC, UA NONE SEEN 0 - 5 WBC/hpf   RBC / HPF NONE SEEN 0 - 5 RBC/hpf   Bacteria, UA NONE SEEN NONE SEEN   Urine-Other MUCOUS PRESENT    US Ob Transvaginal  06/04/2015  CLINICAL DATA:  30 year old pregnant female presents with 2 days of vaginal bleeding and pelvic pain. Intrauterine gestation without embryo on obstetric scan from 5 days prior. EDC by LMP: 02/16/2016, projecting to an expected gestational age of [redacted] weeks 2 days. EXAM: TRANSVAGINAL OB ULTRASOUND TECHNIQUE: Transvaginal ultrasound was performed for complete evaluation of the gestation as well as the maternal uterus, adnexal regions, and pelvic cul-de-sac. COMPARISON:  05/30/2015 obstetric scan. FINDINGS: Intrauterine gestational sac: Single intrauterine gestational sac appears normal in size, shape and position. Yolk sac:  Present. Embryo:  Present. Embryonic Cardiac Activity: Regular rate and rhythm. Embryonic Heart Rate: 124 bpm CRL:   3.8  mm   6 w 1 d                  [place] EDC: 01/27/2016 Subchorionic hemorrhage: No definite perigestational bleed. Hypoechoic curvilinear regions surrounding the gestational sac demonstrate mobile echoes on the cine sequence, most consistent with decidual vascularity. Maternal uterus/adnexae: Right ovary measures 1.9 x 1.9 x 2.6 cm. Left ovary measures 3.1 x 1.6 x 3.2 cm. No suspicious ovarian or adnexal masses. No abnormal free fluid in the pelvis. No uterine fibroids. IMPRESSION: 1. Single living intrauterine gestation at 6 weeks 1 day by crown-rump length. Normal embryonic cardiac activity. 2. No first-trimester gestational abnormality detected. Electronically Signed   By: Ilona Sorrel M.D.   On: 06/04/2015 12:07   US Abdomen Limited Ruq  06/04/2015  CLINICAL DATA:  Nausea/ vomiting and pregnancy. EXAM: US ABDOMEN LIMITED - RIGHT UPPER QUADRANT COMPARISON:  None. FINDINGS: Gallbladder: No gallstones or wall  thickening visualized. Positive sonographic Murphy sign noted by sonographer. Common bile duct: Diameter: 2 mm Liver: No focal lesion identified. Within normal limits in parenchymal echogenicity. IMPRESSION: 1.  No sonographic features of acute cholecystitis. 2.  Positive sonographic Murphy sign reported by sonographer. Electronically Signed   By: Kerby Moors M.D.   On: 06/04/2015 14:13    Review of Systems  Constitutional: Negative for fever and chills.  Gastrointestinal: Positive for heartburn, nausea, vomiting and abdominal pain. Negative for diarrhea and constipation.  Psychiatric/Behavioral: Negative for depression, suicidal ideas, hallucinations and substance abuse. The patient is not nervous/anxious.    Physical Exam   Blood pressure 99/61, pulse 63, temperature 97.9 F (36.6 C), temperature source Oral, resp. rate 18, last menstrual period 05/12/2015.  Physical Exam  Constitutional: She is oriented to person, place, and time. She appears well-developed and well-nourished. No distress.  Respiratory:  Effort normal and breath sounds normal.  GI: Soft. Normal appearance and bowel sounds are normal. There is tenderness in the right upper quadrant and epigastric area. There is positive Murphy's sign. There is no rigidity, no rebound and no guarding.  Musculoskeletal: Normal range of motion.  Neurological: She is alert and oriented to person, place, and time.  Skin: Skin is warm. She is not diaphoretic.  Psychiatric: Her behavior is normal.    MAU Course  Procedures  None  MDM  [place] for viability  LR bolus Reglan 10 mg  Pepcid 20 mg Patient resting after medication  CBC CMP Hcg Amylase Lipase UA UDS   Patient requesting additional antiemetics Zofran 4 mg given.  Pepcid 20 mg   Discussed patient with Dr. Elly Modena GI consulted.  Dr. Elly Modena to discuss patient with GI.   Assessment and Plan   A:  1. Elevated amylase and lipase   2. Abdominal pain in pregnancy,  antepartum   3. Upper abdominal pain   4. Nausea and vomiting     P:  Discharge home in stable condition -Rx Reglan TID -RX Phenergan at bedtime -RX zantac  Message sent to the clinic for OB care.  Go to [facility] if upper abdominal pain or vomiting worsen.    Lezlie Lye, NP 06/04/2015 10:21 AM

## 2015-06-23 ENCOUNTER — Encounter (HOSPITAL_COMMUNITY): Payer: Self-pay | Admitting: *Deleted

## 2015-06-23 ENCOUNTER — Inpatient Hospital Stay (HOSPITAL_COMMUNITY)
Admission: AD | Admit: 2015-06-23 | Discharge: 2015-06-23 | Disposition: A | Discharge status: Home / Self Care | Payer: Medicaid Other | Source: Ambulatory Visit | Attending: Family Medicine | Admitting: Family Medicine

## 2015-06-23 DIAGNOSIS — R109 Unspecified abdominal pain: Secondary | ICD-10-CM | POA: Diagnosis present

## 2015-06-23 DIAGNOSIS — O99611 Diseases of the digestive system complicating pregnancy, first trimester: Secondary | ICD-10-CM | POA: Insufficient documentation

## 2015-06-23 DIAGNOSIS — R1030 Lower abdominal pain, unspecified: Secondary | ICD-10-CM | POA: Insufficient documentation

## 2015-06-23 DIAGNOSIS — Z91040 Latex allergy status: Secondary | ICD-10-CM | POA: Diagnosis not present

## 2015-06-23 DIAGNOSIS — Z3A08 8 weeks gestation of pregnancy: Secondary | ICD-10-CM | POA: Diagnosis not present

## 2015-06-23 DIAGNOSIS — O26899 Other specified pregnancy related conditions, unspecified trimester: Secondary | ICD-10-CM

## 2015-06-23 DIAGNOSIS — Z79899 Other long term (current) drug therapy: Secondary | ICD-10-CM | POA: Diagnosis not present

## 2015-06-23 DIAGNOSIS — K5900 Constipation, unspecified: Secondary | ICD-10-CM | POA: Insufficient documentation

## 2015-06-23 DIAGNOSIS — F17210 Nicotine dependence, cigarettes, uncomplicated: Secondary | ICD-10-CM | POA: Insufficient documentation

## 2015-06-23 DIAGNOSIS — K219 Gastro-esophageal reflux disease without esophagitis: Secondary | ICD-10-CM | POA: Insufficient documentation

## 2015-06-23 DIAGNOSIS — O26891 Other specified pregnancy related conditions, first trimester: Secondary | ICD-10-CM | POA: Insufficient documentation

## 2015-06-23 DIAGNOSIS — O99331 Smoking (tobacco) complicating pregnancy, first trimester: Secondary | ICD-10-CM | POA: Insufficient documentation

## 2015-06-23 DIAGNOSIS — Z885 Allergy status to narcotic agent status: Secondary | ICD-10-CM | POA: Diagnosis not present

## 2015-06-23 DIAGNOSIS — Z888 Allergy status to other drugs, medicaments and biological substances status: Secondary | ICD-10-CM | POA: Insufficient documentation

## 2015-06-23 DIAGNOSIS — Z8742 Personal history of other diseases of the female genital tract: Secondary | ICD-10-CM | POA: Insufficient documentation

## 2015-06-23 HISTORY — DX: Bronchitis, not specified as acute or chronic: J40

## 2015-06-23 HISTORY — DX: Urinary tract infection, site not specified: N39.0

## 2015-06-23 HISTORY — DX: Reserved for concepts with insufficient information to code with codable children: IMO0002

## 2015-06-23 HISTORY — DX: Depression, unspecified: F32.A

## 2015-06-23 HISTORY — DX: Major depressive disorder, single episode, unspecified: F32.9

## 2015-06-23 LAB — CBC
HCT: 37.1 % (ref 36.0–46.0)
Hemoglobin: 12.9 g/dL (ref 12.0–15.0)
MCH: 31.9 pg (ref 26.0–34.0)
MCHC: 34.8 g/dL (ref 30.0–36.0)
MCV: 91.6 fL (ref 78.0–100.0)
PLATELETS: 316 10*3/uL (ref 150–400)
RBC: 4.05 MIL/uL (ref 3.87–5.11)
RDW: 13.4 % (ref 11.5–15.5)
WBC: 16.2 10*3/uL — AB (ref 4.0–10.5)

## 2015-06-23 LAB — URINALYSIS, ROUTINE W REFLEX MICROSCOPIC
BILIRUBIN URINE: NEGATIVE
Glucose, UA: NEGATIVE mg/dL
Hgb urine dipstick: NEGATIVE
KETONES UR: NEGATIVE mg/dL
NITRITE: NEGATIVE
PROTEIN: NEGATIVE mg/dL
Specific Gravity, Urine: <1.005 — ABNORMAL LOW (ref 1.005–1.030)
pH: 5.5 (ref 5.0–8.0)

## 2015-06-23 LAB — URINE MICROSCOPIC-ADD ON

## 2015-06-23 MED ORDER — DICYCLOMINE HCL 20 MG PO TABS: 20.0000 mg | ORAL_TABLET | Freq: Two times a day (BID) | ORAL | Status: DC

## 2015-06-23 MED ORDER — DOCUSATE SODIUM 100 MG PO CAPS
100.0000 mg | ORAL_CAPSULE | Freq: Two times a day (BID) | ORAL | Status: DC
Start: 2015-06-23 — End: 2015-11-21

## 2015-06-23 MED ORDER — DICYCLOMINE HCL 10 MG PO CAPS
10.0000 mg | ORAL_CAPSULE | Freq: Once | ORAL | Status: AC
  Administered 2015-06-23: 10 mg via ORAL
  Filled 2015-06-23: qty 1

## 2015-06-23 NOTE — MAU Provider Note (Signed)
History     CSN: [id number]  Arrival date and time: 06/23/15 4098   First Provider Initiated Contact with Patient 06/23/15 0840      Chief Complaint  Patient presents with  . Abdominal Pain  . Back Pain   HPI  Joyce Ward is a 30 y.o. [id number] at [redacted]w[redacted]d who presents with abdominal pain. Reports constant lower abdominal pain x 1 weeks that radiates to right lower back. Rates pain 10/10. States she has been taking tylenol & ibuprofen with no relief & that pain is affecting her sleep.  Reports several painful hard stools per day since last week. Patient initially said she's been taking a stool softener twice daily every day; then said she hasn't used a stool softener in over a week; then said she's not sure if it was a stool softener of laxative.  Denies vaginal bleeding, vaginal discharge, or urinary complaints.  Denies fever/chills, or appetite changes.   OB History    Gravida Para Term Preterm AB TAB SAB Ectopic Multiple Living   Past Medical History  Diagnosis Date  . Endometriosis   . GERD (gastroesophageal reflux disease)   . Pelvic pain in female 08/19/2014  . History of endometriosis 08/19/2014  . Irregular intermenstrual bleeding 08/19/2014    Past Surgical History  Procedure Laterality Date  . Laparoscopy      Family History  Problem Relation Age of Onset  . Endometriosis Mother     Social History  Substance Use Topics  . Smoking status: Current Some Day Smoker -- 1.00 packs/day for 5 years    Types: Cigarettes  . Smokeless tobacco: Never Used  . Alcohol Use: Yes     Comment: occasional    Allergies:  Allergies  Allergen Reactions  . Codeine Hives and Swelling  . Latex Hives  . Naproxen Hives  . Toradol [Ketorolac Tromethamine] Hives  . Tramadol Hives    Prescriptions prior to admission  Medication Sig Dispense Refill Last Dose  . metoCLOPramide (REGLAN) 10 MG tablet Take 1 tablet (10 mg total) by mouth 3 (three) times daily  before meals. 30 tablet 0   . Prenatal Vit-Fe Fumarate-FA (PRENATAL MULTIVITAMIN) TABS tablet Take 1 tablet by mouth daily at 12 noon. For low Vitamin 30 tablet 0 new rx  . promethazine (PHENERGAN) 25 MG tablet Take 1 tablet (25 mg total) by mouth at bedtime as needed for nausea or vomiting. 30 tablet 2   . ranitidine (ZANTAC) 150 MG capsule Take 1 capsule (150 mg total) by mouth 2 (two) times daily. 60 capsule 0     Review of Systems  Constitutional: Negative for fever, chills and weight loss.  Gastrointestinal: Positive for nausea, abdominal pain and constipation. Negative for vomiting, diarrhea and blood in stool.  Genitourinary: Negative.   Musculoskeletal: Positive for back pain.   Physical Exam   Blood pressure 102/70, pulse 107, temperature 97.9 F (36.6 C), temperature source Oral, resp. rate 18, last menstrual period 05/12/2015.  Physical Exam  MAU Course  Procedures Results for orders placed or performed during the hospital encounter of 06/23/15 (from the past 24 hour(s))  Urinalysis, Routine w reflex microscopic (not at [facility])     Status: Abnormal   Collection Time: 06/23/15  8:20 AM  Result Value Ref Range   Color, Urine YELLOW YELLOW   APPearance CLEAR CLEAR   Specific Gravity, Urine <1.005 (L) 1.005 - 1.030   pH  5.5 5.0 - 8.0   Glucose, UA NEGATIVE NEGATIVE mg/dL   Hgb urine dipstick NEGATIVE NEGATIVE   Bilirubin Urine NEGATIVE NEGATIVE   Ketones, ur NEGATIVE NEGATIVE mg/dL   Protein, ur NEGATIVE NEGATIVE mg/dL   Nitrite NEGATIVE NEGATIVE   Leukocytes, UA TRACE (A) NEGATIVE  Urine microscopic-add on     Status: Abnormal   Collection Time: 06/23/15  8:20 AM  Result Value Ref Range   Squamous Epithelial / LPF 0-5 (A) NONE SEEN   WBC, UA 0-5 0 - 5 WBC/hpf   RBC / HPF 0-5 0 - 5 RBC/hpf   Bacteria, UA RARE (A) NONE SEEN   Urine-Other MUCOUS PRESENT   CBC     Status: Abnormal   Collection Time: 06/23/15  8:52 AM  Result Value Ref Range   WBC 16.2 (H) 4.0 - 10.5  K/uL   RBC 4.05 3.87 - 5.11 MIL/uL   Hemoglobin 12.9 12.0 - 15.0 g/dL   HCT 96.037.1 45.436.0 - 09.846.0 %   MCV 91.6 78.0 - 100.0 fL   MCH 31.9 26.0 - 34.0 pg   MCHC 34.8 30.0 - 36.0 g/dL   RDW 11.913.4 14.711.5 - 82.915.5 %   Platelets 316 150 - 400 K/uL    MDM Cervix closed Rectal exam normal VSS No evidence of infection or acute abdomen -- leukocytosis comparable to last visit & likely d/t pregnancy Pt resting with eyes closed until she is spoken to at which time she starts crying & requesting pain medication S/w Dr. Adrian BlackwaterStinson -- agrees with plan to discharge home & treat for constipation Pt left unit in no apparent distress after discharge  Assessment and Plan  A:  1. Constipation, unspecified constipation type   2. Abdominal pain affecting pregnancy     P; Discharge home Rx bentyl & colace Discussed constipation management at home Counseled pt that NSAIDs should be avoided in first trimester of pregnancy [facility] Discussed reasons to return to [facility]U  Joyce Ward 06/23/2015, 8:38 AM

## 2015-06-23 NOTE — Discharge Instructions (Signed)

## 2015-06-23 NOTE — MAU Note (Signed)
Pt C/O severe lower abd & lower back pain for several days.  Bowel movements are very painful, denies diarrhea or constipation.  No vomiting.  Denies bleeding.

## 2015-07-06 ENCOUNTER — Encounter (HOSPITAL_COMMUNITY): Payer: Self-pay | Admitting: Emergency Medicine

## 2015-07-06 ENCOUNTER — Emergency Department (HOSPITAL_COMMUNITY)
Admission: EM | Admit: 2015-07-06 | Discharge: 2015-07-06 | Disposition: A | Discharge status: Home / Self Care | Payer: Medicaid Other | Attending: Emergency Medicine | Admitting: Emergency Medicine

## 2015-07-06 DIAGNOSIS — F17210 Nicotine dependence, cigarettes, uncomplicated: Secondary | ICD-10-CM | POA: Diagnosis not present

## 2015-07-06 DIAGNOSIS — Z791 Long term (current) use of non-steroidal anti-inflammatories (NSAID): Secondary | ICD-10-CM | POA: Diagnosis not present

## 2015-07-06 DIAGNOSIS — R112 Nausea with vomiting, unspecified: Secondary | ICD-10-CM

## 2015-07-06 DIAGNOSIS — Z3A00 Weeks of gestation of pregnancy not specified: Secondary | ICD-10-CM | POA: Insufficient documentation

## 2015-07-06 DIAGNOSIS — O210 Mild hyperemesis gravidarum: Secondary | ICD-10-CM | POA: Diagnosis not present

## 2015-07-06 DIAGNOSIS — F1290 Cannabis use, unspecified, uncomplicated: Secondary | ICD-10-CM | POA: Insufficient documentation

## 2015-07-06 DIAGNOSIS — F329 Major depressive disorder, single episode, unspecified: Secondary | ICD-10-CM | POA: Diagnosis not present

## 2015-07-06 DIAGNOSIS — R109 Unspecified abdominal pain: Secondary | ICD-10-CM | POA: Diagnosis present

## 2015-07-06 DIAGNOSIS — Z79899 Other long term (current) drug therapy: Secondary | ICD-10-CM | POA: Insufficient documentation

## 2015-07-06 LAB — COMPREHENSIVE METABOLIC PANEL
ALT: 10 U/L — ABNORMAL LOW (ref 14–54)
ANION GAP: 7 (ref 5–15)
AST: 16 U/L (ref 15–41)
Albumin: 4.4 g/dL (ref 3.5–5.0)
Alkaline Phosphatase: 59 U/L (ref 38–126)
BILIRUBIN TOTAL: 0.5 mg/dL (ref 0.3–1.2)
BUN: 9 mg/dL (ref 6–20)
CHLORIDE: 105 mmol/L (ref 101–111)
CO2: 23 mmol/L (ref 22–32)
Calcium: 8.9 mg/dL (ref 8.9–10.3)
Creatinine, Ser: 0.49 mg/dL (ref 0.44–1.00)
Glucose, Bld: 93 mg/dL (ref 65–99)
POTASSIUM: 3.6 mmol/L (ref 3.5–5.1)
Sodium: 135 mmol/L (ref 135–145)
Total Protein: 7.8 g/dL (ref 6.5–8.1)

## 2015-07-06 LAB — CBC WITH DIFFERENTIAL/PLATELET
BASOS ABS: 0 10*3/uL (ref 0.0–0.1)
Basophils Relative: 0 %
EOS PCT: 0 %
Eosinophils Absolute: 0 10*3/uL (ref 0.0–0.7)
HEMATOCRIT: 38.7 % (ref 36.0–46.0)
Hemoglobin: 13.3 g/dL (ref 12.0–15.0)
LYMPHS ABS: 1.6 10*3/uL (ref 0.7–4.0)
LYMPHS PCT: 9 %
MCH: 31.2 pg (ref 26.0–34.0)
MCHC: 34.4 g/dL (ref 30.0–36.0)
MCV: 90.8 fL (ref 78.0–100.0)
Monocytes Absolute: 0.7 10*3/uL (ref 0.1–1.0)
Monocytes Relative: 4 %
NEUTROS ABS: 14.8 10*3/uL — AB (ref 1.7–7.7)
Neutrophils Relative %: 87 %
PLATELETS: 395 10*3/uL (ref 150–400)
RBC: 4.26 MIL/uL (ref 3.87–5.11)
RDW: 12.8 % (ref 11.5–15.5)
WBC: 17.2 10*3/uL — ABNORMAL HIGH (ref 4.0–10.5)

## 2015-07-06 LAB — LIPASE, BLOOD: LIPASE: 18 U/L (ref 11–51)

## 2015-07-06 MED ORDER — HYDROMORPHONE HCL 1 MG/ML IJ SOLN
1.0000 mg | Freq: Once | INTRAMUSCULAR | Status: AC
  Administered 2015-07-06: 1 mg via INTRAVENOUS
  Filled 2015-07-06: qty 1

## 2015-07-06 MED ORDER — ALUM & MAG HYDROXIDE-SIMETH 200-200-20 MG/5ML PO SUSP
30.0000 mL | Freq: Once | ORAL | Status: AC
  Administered 2015-07-06: 30 mL via ORAL
  Filled 2015-07-06: qty 30

## 2015-07-06 MED ORDER — ONDANSETRON 4 MG PO TBDP: ORAL_TABLET | ORAL | Status: DC

## 2015-07-06 MED ORDER — SODIUM CHLORIDE 0.9 % IV BOLUS (SEPSIS)
1000.0000 mL | Freq: Once | INTRAVENOUS | Status: AC
  Administered 2015-07-06: 1000 mL via INTRAVENOUS

## 2015-07-06 MED ORDER — PROMETHAZINE HCL 25 MG/ML IJ SOLN
12.5000 mg | Freq: Once | INTRAMUSCULAR | Status: AC
  Administered 2015-07-06: 12.5 mg via INTRAVENOUS
  Filled 2015-07-06: qty 1

## 2015-07-06 NOTE — ED Provider Notes (Signed)
CSN: [id number]     Arrival date & time 07/06/15  1147 History   First MD Initiated Contact with Patient 07/06/15 1203     Chief Complaint  Patient presents with  . Abdominal Pain     (Consider location/radiation/quality/duration/timing/severity/associated sxs/prior Treatment) HPI  30 year old female history of chronic abdominal pain and chronic pelvic pain who is also approximately [redacted] weeks pregnant presents immersed department today for epigastric abdominal pain and vomiting similar to previous episode of pancreatitis. This started yesterday that significant worsened this morning. No fevers, chills, urinary symptoms, chest pain, cough or back pain. The pain does not radiate anywhere. It feels like an aching throbbing pain. No recent sick contacts or travels.  Past Medical History  Diagnosis Date  . Endometriosis   . GERD (gastroesophageal reflux disease)   . Pelvic pain in female 08/19/2014  . History of endometriosis 08/19/2014  . Irregular intermenstrual bleeding 08/19/2014  . History of prescription drug abuse (HCC) 2016  . Depression   . Bronchitis   . UTI (urinary tract infection)    Past Surgical History  Procedure Laterality Date  . Laparoscopy     Family History  Problem Relation Age of Onset  . Endometriosis Mother    Social History  Substance Use Topics  . Smoking status: Current Some Day Smoker -- 1.00 packs/day for 5 years    Types: Cigarettes  . Smokeless tobacco: Never Used  . Alcohol Use: Yes     Comment: occasional   OB History    Gravida Para Term Preterm AB TAB SAB Ectopic Multiple Living   4 2   1  1   2      Review of Systems  All other systems reviewed and are negative.     Allergies  Codeine; Latex; Naproxen; Toradol; and Tramadol  Home Medications   Prior to Admission medications   Medication Sig Start Date End Date Taking? Authorizing Provider  acetaminophen (TYLENOL) 500 MG tablet Take 1,000 mg by mouth every 6 (six) hours as needed  for mild pain.   Yes Historical Provider, MD  dicyclomine (BENTYL) 20 MG tablet Take 1 tablet (20 mg total) by mouth 2 (two) times daily. 06/23/15  Yes Judeth HornErin Lawrence, NP  diphenhydrAMINE (BENADRYL) 25 mg capsule Take 25 mg by mouth every 6 (six) hours as needed.   Yes Historical Provider, MD  docusate sodium (COLACE) 100 MG capsule Take 1 capsule (100 mg total) by mouth 2 (two) times daily. 06/23/15  Yes Judeth HornErin Lawrence, NP  Prenatal Vit-Fe Fumarate-FA (PRENATAL MULTIVITAMIN) TABS tablet Take 1 tablet by mouth daily at 12 noon. For low Vitamin 06/02/15  Yes Sanjuana KavaAgnes I Nwoko, NP  promethazine (PHENERGAN) 25 MG tablet TAKE 1 TABLET BY MOUTH AT BEDTIME AS NEEDED FOR NAUSEA OR VOMITING 06/04/15  Yes Historical Provider, MD  ranitidine (ZANTAC) 150 MG capsule TAKE 1 CAPSULE BY MOUTH 2 TIMES DAILY 06/04/15  Yes Historical Provider, MD  Simethicone (GAS-X PO) Take 3 tablets by mouth daily as needed (gas).   Yes Historical Provider, MD  metoCLOPramide (REGLAN) 10 MG tablet Reported on 07/06/2015 06/04/15   Historical Provider, MD  ondansetron (ZOFRAN ODT) 4 MG disintegrating tablet 4mg  ODT q4 hours prn nausea/vomit 07/06/15   Marily MemosJason Jabir Dahlem, MD   BP 92/62 mmHg  Pulse 90  Temp(Src) 98.1 F (36.7 C) (Oral)  Resp 16  Ht 5\' 2"  (1.575 m)  Wt 125 lb (56.7 kg)  BMI 22.86 kg/m2  SpO2 100%  LMP 05/12/2015 Physical Exam  Constitutional: She  is oriented to person, place, and time. She appears well-developed and well-nourished.  HENT:  Head: Normocephalic and atraumatic.  Neck: Normal range of motion.  Cardiovascular: Normal rate and regular rhythm.   Pulmonary/Chest: Effort normal. No stridor. No respiratory distress.  Abdominal: Soft. Bowel sounds are normal. She exhibits no distension. There is no tenderness. There is no rebound and no guarding.  Musculoskeletal: She exhibits no edema or tenderness.  Neurological: She is alert and oriented to person, place, and time.  Skin: Skin is warm and dry.  Nursing note and  vitals reviewed.   ED Course  Procedures (including critical care time) Labs Review Labs Reviewed  CBC WITH DIFFERENTIAL/PLATELET - Abnormal; Notable for the following:    WBC 17.2 (*)    Neutro Abs 14.8 (*)    All other components within normal limits  COMPREHENSIVE METABOLIC PANEL - Abnormal; Notable for the following:    ALT 10 (*)    All other components within normal limits  LIPASE, BLOOD  URINALYSIS, ROUTINE W REFLEX MICROSCOPIC (NOT AT [facility])    Imaging Review No results found. I have personally reviewed and evaluated these images and lab results as part of my medical decision-making.   EKG Interpretation None      MDM   Final diagnoses:  Non-intractable vomiting with nausea, vomiting of unspecified type   Unsure if patient has pink otitis, cholecystitis versus normal pertinent to pain. We'll evaluate appropriately and treated symptomatically in the meantime.  Repeat abdominal exam still benign. Patient labs are reassuring. Tolerating by mouth. Requesting pain medication to go home however did not feel is appropriate without a cause for her pain or while she was pregnant. Advised that she take Tylenol and gave her prescription for Zofran.  New Prescriptions: Discharge Medication List as of 07/06/2015  1:54 PM    START taking these medications   Details  ondansetron (ZOFRAN ODT) 4 MG disintegrating tablet  ODT q4 hours prn nausea/vomit, Print         I have personally and contemperaneously reviewed labs and imaging and used in my decision making as above.   A medical screening exam was performed and I feel the patient has had an appropriate workup for their chief complaint at this time and likelihood of emergent condition existing is low and thus workup can continue on an outpatient basis.. Their vital signs are stable. They have been counseled on decision, discharge, follow up and which symptoms necessitate immediate return to the emergency department.  They  verbally stated understanding and agreement with plan and discharged in stable condition.      Marily Memos, MD 07/06/15 1530

## 2015-07-06 NOTE — ED Notes (Signed)
Patient given sprite, crackers, and peanut butter per request.

## 2015-07-06 NOTE — ED Notes (Signed)
During IV initiation, patient cursing. RN asked politely for patient to refrain from using obscenities. Patient states she will "do it again if I want if I don't get any relief, and maybe I need another nurse" Patient medicated per orders.

## 2015-07-06 NOTE — ED Notes (Signed)
PT c/o epigastric pain with nausea unrelieved by home phenergan tablets this am. PT states she is around [redacted]wks pregnant and see's the woman's clinic for prenatal care.

## 2015-07-15 ENCOUNTER — Encounter: Payer: Self-pay | Admitting: Advanced Practice Midwife

## 2015-07-19 ENCOUNTER — Encounter (HOSPITAL_COMMUNITY): Payer: Self-pay | Admitting: *Deleted

## 2015-07-19 ENCOUNTER — Emergency Department (HOSPITAL_COMMUNITY)
Admission: EM | Admit: 2015-07-19 | Discharge: 2015-07-19 | Disposition: A | Discharge status: Home / Self Care | Payer: Medicaid Other | Attending: Emergency Medicine | Admitting: Emergency Medicine

## 2015-07-19 ENCOUNTER — Emergency Department (HOSPITAL_COMMUNITY): Payer: Medicaid Other

## 2015-07-19 DIAGNOSIS — R102 Pelvic and perineal pain: Secondary | ICD-10-CM | POA: Insufficient documentation

## 2015-07-19 DIAGNOSIS — F329 Major depressive disorder, single episode, unspecified: Secondary | ICD-10-CM | POA: Insufficient documentation

## 2015-07-19 DIAGNOSIS — O0001 Abdominal pregnancy with intrauterine pregnancy: Secondary | ICD-10-CM | POA: Insufficient documentation

## 2015-07-19 DIAGNOSIS — O26891 Other specified pregnancy related conditions, first trimester: Secondary | ICD-10-CM | POA: Diagnosis present

## 2015-07-19 DIAGNOSIS — R1030 Lower abdominal pain, unspecified: Secondary | ICD-10-CM | POA: Diagnosis not present

## 2015-07-19 DIAGNOSIS — Z79899 Other long term (current) drug therapy: Secondary | ICD-10-CM | POA: Insufficient documentation

## 2015-07-19 DIAGNOSIS — Z3490 Encounter for supervision of normal pregnancy, unspecified, unspecified trimester: Secondary | ICD-10-CM

## 2015-07-19 DIAGNOSIS — F17210 Nicotine dependence, cigarettes, uncomplicated: Secondary | ICD-10-CM | POA: Insufficient documentation

## 2015-07-19 DIAGNOSIS — Z3A13 13 weeks gestation of pregnancy: Secondary | ICD-10-CM | POA: Diagnosis not present

## 2015-07-19 DIAGNOSIS — R52 Pain, unspecified: Secondary | ICD-10-CM

## 2015-07-19 DIAGNOSIS — Z331 Pregnant state, incidental: Secondary | ICD-10-CM

## 2015-07-19 DIAGNOSIS — O219 Vomiting of pregnancy, unspecified: Secondary | ICD-10-CM

## 2015-07-19 DIAGNOSIS — O99331 Smoking (tobacco) complicating pregnancy, first trimester: Secondary | ICD-10-CM | POA: Insufficient documentation

## 2015-07-19 LAB — CBC WITH DIFFERENTIAL/PLATELET
BASOS ABS: 0 10*3/uL (ref 0.0–0.1)
Basophils Relative: 0 %
Eosinophils Absolute: 0 10*3/uL (ref 0.0–0.7)
Eosinophils Relative: 0 %
HCT: 36.8 % (ref 36.0–46.0)
HEMOGLOBIN: 12.9 g/dL (ref 12.0–15.0)
LYMPHS ABS: 3.1 10*3/uL (ref 0.7–4.0)
LYMPHS PCT: 20 %
MCH: 32.3 pg (ref 26.0–34.0)
MCHC: 35.1 g/dL (ref 30.0–36.0)
MCV: 92.2 fL (ref 78.0–100.0)
Monocytes Absolute: 0.5 10*3/uL (ref 0.1–1.0)
Monocytes Relative: 3 %
NEUTROS ABS: 11.6 10*3/uL — AB (ref 1.7–7.7)
NEUTROS PCT: 77 %
PLATELETS: 295 10*3/uL (ref 150–400)
RBC: 3.99 MIL/uL (ref 3.87–5.11)
RDW: 12.6 % (ref 11.5–15.5)
WBC: 15.1 10*3/uL — AB (ref 4.0–10.5)

## 2015-07-19 LAB — COMPREHENSIVE METABOLIC PANEL
ALK PHOS: 48 U/L (ref 38–126)
ALT: 10 U/L — AB (ref 14–54)
ANION GAP: 5 (ref 5–15)
AST: 13 U/L — ABNORMAL LOW (ref 15–41)
Albumin: 3.8 g/dL (ref 3.5–5.0)
BILIRUBIN TOTAL: 0.4 mg/dL (ref 0.3–1.2)
BUN: 6 mg/dL (ref 6–20)
CALCIUM: 8.3 mg/dL — AB (ref 8.9–10.3)
CO2: 23 mmol/L (ref 22–32)
CREATININE: 0.43 mg/dL — AB (ref 0.44–1.00)
Chloride: 108 mmol/L (ref 101–111)
Glucose, Bld: 86 mg/dL (ref 65–99)
Potassium: 3.5 mmol/L (ref 3.5–5.1)
Sodium: 136 mmol/L (ref 135–145)
TOTAL PROTEIN: 6.7 g/dL (ref 6.5–8.1)

## 2015-07-19 LAB — TYPE AND SCREEN
ABO/RH(D): B POS
ANTIBODY SCREEN: NEGATIVE

## 2015-07-19 LAB — HCG, QUANTITATIVE, PREGNANCY: HCG, BETA CHAIN, QUANT, S: 57831 m[IU]/mL — AB (ref <5)

## 2015-07-19 MED ORDER — HYDROMORPHONE HCL 1 MG/ML IJ SOLN
1.0000 mg | Freq: Once | INTRAMUSCULAR | Status: AC
  Administered 2015-07-19: 1 mg via INTRAVENOUS
  Filled 2015-07-19: qty 1

## 2015-07-19 MED ORDER — HYDROCODONE-ACETAMINOPHEN 5-325 MG PO TABS: 1.0000 | ORAL_TABLET | Freq: Four times a day (QID) | ORAL | Status: DC | PRN

## 2015-07-19 MED ORDER — SODIUM CHLORIDE 0.9 % IV BOLUS (SEPSIS)
1000.0000 mL | Freq: Once | INTRAVENOUS | Status: AC
  Administered 2015-07-19: 1000 mL via INTRAVENOUS

## 2015-07-19 MED ORDER — ONDANSETRON HCL 4 MG/2ML IJ SOLN
4.0000 mg | Freq: Once | INTRAMUSCULAR | Status: AC
  Administered 2015-07-19: 4 mg via INTRAVENOUS
  Filled 2015-07-19: qty 2

## 2015-07-19 NOTE — ED Notes (Signed)
Ed MD in to see pt

## 2015-07-19 NOTE — ED Provider Notes (Signed)
CSN: [id number]     Arrival date & time 07/19/15  1701 History   First MD Initiated Contact with Patient 07/19/15 1710     Chief Complaint  Patient presents with  . Abdominal Pain     (Consider location/radiation/quality/duration/timing/severity/associated sxs/prior Treatment) Patient is a 30 y.o. female presenting with abdominal pain. The history is provided by the patient (Patient states she's pregnant she's having vaginal bleeding and pain today).  Abdominal Pain Pain location:  Suprapubic Pain quality: aching   Pain radiates to:  Does not radiate Pain severity:  Moderate Onset quality:  Sudden Timing:  Constant Progression:  Waxing and waning Chronicity:  New Context: not alcohol use   Associated symptoms: vaginal bleeding   Associated symptoms: no chest pain, no cough, no diarrhea, no fatigue and no hematuria     Past Medical History  Diagnosis Date  . Endometriosis   . GERD (gastroesophageal reflux disease)   . Pelvic pain in female 08/19/2014  . History of endometriosis 08/19/2014  . Irregular intermenstrual bleeding 08/19/2014  . History of prescription drug abuse (HCC) 2016  . Depression   . Bronchitis   . UTI (urinary tract infection)    Past Surgical History  Procedure Laterality Date  . Laparoscopy     Family History  Problem Relation Age of Onset  . Endometriosis Mother    Social History  Substance Use Topics  . Smoking status: Current Some Day Smoker -- 1.00 packs/day for 5 years    Types: Cigarettes  . Smokeless tobacco: Never Used  . Alcohol Use: Yes     Comment: occasional   OB History    Gravida Para Term Preterm AB TAB SAB Ectopic Multiple Living   4 2   1  1   2      Review of Systems  Constitutional: Negative for appetite change and fatigue.  HENT: Negative for congestion, ear discharge and sinus pressure.   Eyes: Negative for discharge.  Respiratory: Negative for cough.   Cardiovascular: Negative for chest pain.  Gastrointestinal:  Positive for abdominal pain. Negative for diarrhea.  Genitourinary: Positive for vaginal bleeding. Negative for frequency and hematuria.  Musculoskeletal: Negative for back pain.  Skin: Negative for rash.  Neurological: Negative for seizures and headaches.  Psychiatric/Behavioral: Negative for hallucinations.      Allergies  Codeine; Latex; Naproxen; Toradol; and Tramadol  Home Medications   Prior to Admission medications   Medication Sig Start Date End Date Taking? Authorizing Provider  acetaminophen (TYLENOL) 500 MG tablet Take 1,000 mg by mouth every 6 (six) hours as needed for mild pain.   Yes Historical Provider, MD  dicyclomine (BENTYL) 20 MG tablet Take 1 tablet (20 mg total) by mouth 2 (two) times daily. 06/23/15  Yes Judeth HornErin Lawrence, NP  docusate sodium (COLACE) 100 MG capsule Take 1 capsule (100 mg total) by mouth 2 (two) times daily. 06/23/15  Yes Judeth HornErin Lawrence, NP  ondansetron (ZOFRAN ODT) 4 MG disintegrating tablet 4mg  ODT q4 hours prn nausea/vomit Patient taking differently: Take 4 mg by mouth every 4 (four) hours as needed for nausea or vomiting. 4mg  ODT q4 hours prn nausea/vomit 07/06/15  Yes Marily MemosJason Mesner, MD  Prenatal Vit-Fe Fumarate-FA (PRENATAL MULTIVITAMIN) TABS tablet Take 1 tablet by mouth daily at 12 noon. For low Vitamin 06/02/15  Yes Sanjuana KavaAgnes I Nwoko, NP  promethazine (PHENERGAN) 25 MG tablet TAKE 1 TABLET BY MOUTH AT BEDTIME AS NEEDED FOR NAUSEA OR VOMITING 06/04/15  Yes Historical Provider, MD  HYDROcodone-acetaminophen (NORCO/VICODIN) 5-325 MG  tablet Take 1 tablet by mouth every 6 (six) hours as needed for moderate pain. 07/19/15   Bethann BerkshireJoseph Ahmari Duerson, MD   BP 93/49 mmHg  Pulse 84  Temp(Src) 98.2 F (36.8 C) (Oral)  Resp 20  Ht 5\' 2"  (1.575 m)  Wt 127 lb (57.607 kg)  BMI 23.22 kg/m2  SpO2 99%  LMP 05/12/2015 Physical Exam  Constitutional: She is oriented to person, place, and time. She appears well-developed.  HENT:  Head: Normocephalic.  Eyes: Conjunctivae and EOM  are normal. No scleral icterus.  Neck: Neck supple. No thyromegaly present.  Cardiovascular: Normal rate and regular rhythm.  Exam reveals no gallop and no friction rub.   No murmur heard. Pulmonary/Chest: No stridor. She has no wheezes. She has no rales. She exhibits no tenderness.  Abdominal: She exhibits no distension. There is no tenderness. There is no rebound.  Genitourinary:  Tender cervix and no bleeding noted  Musculoskeletal: Normal range of motion. She exhibits no edema.  Lymphadenopathy:    She has no cervical adenopathy.  Neurological: She is oriented to person, place, and time. She exhibits normal muscle tone. Coordination normal.  Skin: No rash noted. No erythema.  Psychiatric: She has a normal mood and affect. Her behavior is normal.    ED Course  Procedures (including critical care time) Labs Review Labs Reviewed  CBC WITH DIFFERENTIAL/PLATELET - Abnormal; Notable for the following:    WBC 15.1 (*)    Neutro Abs 11.6 (*)    All other components within normal limits  COMPREHENSIVE METABOLIC PANEL - Abnormal; Notable for the following:    Creatinine, Ser 0.43 (*)    Calcium 8.3 (*)    AST 13 (*)    ALT 10 (*)    All other components within normal limits  HCG, QUANTITATIVE, PREGNANCY - Abnormal; Notable for the following:    hCG, Beta Chain, Quant, [place] [id number] (*)    All other components within normal limits  TYPE AND SCREEN    Imaging Review [place] Ob Comp Less 14 Wks  07/19/2015  CLINICAL DATA:  30 year old female with a history of abdominal cramping and bleeding. EXAM: OBSTETRIC <14 WK ULTRASOUND TECHNIQUE: Transabdominal ultrasound was performed for evaluation of the gestation as well as the maternal uterus and adnexal regions. COMPARISON:  06/04/2015 FINDINGS: Intrauterine gestational sac: Single. Unremarkable amnionic fluid volume. Embryo:  Single gestation Cardiac Activity: Cardiac activity present, 145 beats per minute Heart Rate: 145 bpm CRL:   6.87 cm 13 w 1 d                   US EDC: 01/23/2016 Subchorionic hemorrhage:  None visualized. Maternal uterus/adnexae: Anechoic cystic structure on the right ovary measures 2.7 cm x 2.1 cm x 2.5 cm. Unremarkable appearance of the left ovary. IMPRESSION: Single viable intrauterine pregnancy with estimated gestational age of [redacted] weeks 1 day. Signed, Yvone NeuJaime S. Loreta AveWagner, DO [facility] Electronically Signed   By: Gilmer MorJaime  Wagner D.O.   On: 07/19/2015 18:25   I have personally reviewed and evaluated these images and lab results as part of my medical decision-making.   EKG Interpretation None      MDM   Final diagnoses:  IUP (intrauterine pregnancy), incidental    Patient with pelvic pain and intrauterine pregnancy 13 weeks ultrasound appears normal. Patient instructed to follow-up with OB/GYN this week if she not get an appointment [facility]    Bethann BerkshireJoseph Leman Martinek, MD  07/19/15 2107 

## 2015-07-19 NOTE — ED Notes (Signed)
Pt comes in with lower abdominal pain and cramping starting today. Pt has vaginal bleeding present. Pt states she is currently a little over three months (Pt isn't aware of how many weeks she is). Due date is January 1st

## 2015-07-19 NOTE — Discharge Instructions (Signed)
Follow-up with Dr. Emelda FearFerguson or partners this week. If he cannot get seen this week [facility]

## 2015-07-29 ENCOUNTER — Emergency Department (HOSPITAL_COMMUNITY): Payer: Medicaid Other

## 2015-07-29 ENCOUNTER — Emergency Department (HOSPITAL_COMMUNITY)
Admission: EM | Admit: 2015-07-29 | Discharge: 2015-07-29 | Disposition: A | Discharge status: Home / Self Care | Payer: Medicaid Other | Attending: Emergency Medicine | Admitting: Emergency Medicine

## 2015-07-29 ENCOUNTER — Encounter (HOSPITAL_COMMUNITY): Payer: Self-pay | Admitting: *Deleted

## 2015-07-29 DIAGNOSIS — F329 Major depressive disorder, single episode, unspecified: Secondary | ICD-10-CM | POA: Diagnosis not present

## 2015-07-29 DIAGNOSIS — Z3A14 14 weeks gestation of pregnancy: Secondary | ICD-10-CM | POA: Insufficient documentation

## 2015-07-29 DIAGNOSIS — O99334 Smoking (tobacco) complicating childbirth: Secondary | ICD-10-CM | POA: Insufficient documentation

## 2015-07-29 DIAGNOSIS — F17210 Nicotine dependence, cigarettes, uncomplicated: Secondary | ICD-10-CM | POA: Insufficient documentation

## 2015-07-29 DIAGNOSIS — O9989 Other specified diseases and conditions complicating pregnancy, childbirth and the puerperium: Secondary | ICD-10-CM | POA: Diagnosis not present

## 2015-07-29 DIAGNOSIS — O209 Hemorrhage in early pregnancy, unspecified: Secondary | ICD-10-CM | POA: Diagnosis present

## 2015-07-29 DIAGNOSIS — Z791 Long term (current) use of non-steroidal anti-inflammatories (NSAID): Secondary | ICD-10-CM | POA: Insufficient documentation

## 2015-07-29 DIAGNOSIS — R102 Pelvic and perineal pain: Secondary | ICD-10-CM | POA: Diagnosis not present

## 2015-07-29 DIAGNOSIS — O200 Threatened abortion: Secondary | ICD-10-CM | POA: Diagnosis not present

## 2015-07-29 DIAGNOSIS — Z79899 Other long term (current) drug therapy: Secondary | ICD-10-CM | POA: Insufficient documentation

## 2015-07-29 DIAGNOSIS — O26899 Other specified pregnancy related conditions, unspecified trimester: Secondary | ICD-10-CM

## 2015-07-29 LAB — URINE MICROSCOPIC-ADD ON: RBC / HPF: NONE SEEN RBC/hpf (ref 0–5)

## 2015-07-29 LAB — CBC WITH DIFFERENTIAL/PLATELET
BASOS ABS: 0 10*3/uL (ref 0.0–0.1)
BASOS PCT: 0 %
EOS ABS: 0.1 10*3/uL (ref 0.0–0.7)
EOS PCT: 1 %
HEMATOCRIT: 37.9 % (ref 36.0–46.0)
HEMOGLOBIN: 13 g/dL (ref 12.0–15.0)
LYMPHS ABS: 2.5 10*3/uL (ref 0.7–4.0)
Lymphocytes Relative: 20 %
MCH: 31.3 pg (ref 26.0–34.0)
MCHC: 34.3 g/dL (ref 30.0–36.0)
MCV: 91.3 fL (ref 78.0–100.0)
MONO ABS: 0.6 10*3/uL (ref 0.1–1.0)
MONOS PCT: 4 %
NEUTROS PCT: 75 %
Neutro Abs: 9.5 10*3/uL — ABNORMAL HIGH (ref 1.7–7.7)
Platelets: 314 10*3/uL (ref 150–400)
RBC: 4.15 MIL/uL (ref 3.87–5.11)
RDW: 12.6 % (ref 11.5–15.5)
WBC: 12.7 10*3/uL — ABNORMAL HIGH (ref 4.0–10.5)

## 2015-07-29 LAB — URINALYSIS, ROUTINE W REFLEX MICROSCOPIC
BILIRUBIN URINE: NEGATIVE
Glucose, UA: NEGATIVE mg/dL
Hgb urine dipstick: NEGATIVE
KETONES UR: NEGATIVE mg/dL
NITRITE: NEGATIVE
PH: 6 (ref 5.0–8.0)
Protein, ur: NEGATIVE mg/dL
Specific Gravity, Urine: <1.005 — ABNORMAL LOW (ref 1.005–1.030)

## 2015-07-29 LAB — BASIC METABOLIC PANEL
ANION GAP: 4 — AB (ref 5–15)
BUN: 5 mg/dL — ABNORMAL LOW (ref 6–20)
CALCIUM: 8.3 mg/dL — AB (ref 8.9–10.3)
CHLORIDE: 107 mmol/L (ref 101–111)
CO2: 23 mmol/L (ref 22–32)
CREATININE: 0.45 mg/dL (ref 0.44–1.00)
GFR calc non Af Amer: >60 mL/min (ref >60)
Glucose, Bld: 81 mg/dL (ref 65–99)
Potassium: 3.7 mmol/L (ref 3.5–5.1)
SODIUM: 134 mmol/L — AB (ref 135–145)

## 2015-07-29 MED ORDER — MORPHINE SULFATE (PF) 4 MG/ML IV SOLN
4.0000 mg | Freq: Once | INTRAVENOUS | Status: AC
  Administered 2015-07-29: 4 mg via INTRAVENOUS
  Filled 2015-07-29: qty 1

## 2015-07-29 MED ORDER — SODIUM CHLORIDE 0.9 % IV BOLUS (SEPSIS)
1000.0000 mL | Freq: Once | INTRAVENOUS | Status: AC
  Administered 2015-07-29: 1000 mL via INTRAVENOUS

## 2015-07-29 NOTE — Discharge Instructions (Signed)
[facility] for close follow-up and further discussion regarding pain control. Return for worsening symptoms, including escalating pain, fever, vomiting and unable to keep down food/fluids, worsening vaginal bleeding or any other symptoms concerning to you.   Vaginal Bleeding During Pregnancy, First Trimester A small amount of bleeding (spotting) from the vagina is relatively common in early pregnancy. It usually stops on its own. Various things may cause bleeding or spotting in early pregnancy. Some bleeding may be related to the pregnancy, and some may not. In most cases, the bleeding is normal and is not a problem. However, bleeding can also be a sign of something serious. Be sure to tell your health care provider about any vaginal bleeding right away. Some possible causes of vaginal bleeding during the first trimester include:  Infection or inflammation of the cervix.  Growths (polyps) on the cervix.  Miscarriage or threatened miscarriage.  Pregnancy tissue has developed outside of the uterus and in a fallopian tube (tubal pregnancy).  Tiny cysts have developed in the uterus instead of pregnancy tissue (molar pregnancy). HOME CARE INSTRUCTIONS  Watch your condition for any changes. The following actions may help to lessen any discomfort you are feeling:  Follow your health care provider's instructions for limiting your activity. If your health care provider orders bed rest, you may need to stay in bed and only get up to use the bathroom. However, your health care provider may allow you to continue light activity.  If needed, make plans for someone to help with your regular activities and responsibilities while you are on bed rest.  Keep track of the number of pads you use each day, how often you change pads, and how soaked (saturated) they are. Write this down.  Do not use tampons. Do not douche.  Do not have sexual intercourse or orgasms until  approved by your health care provider.  If you pass any tissue from your vagina, save the tissue so you can show it to your health care provider.  Only take over-the-counter or prescription medicines as directed by your health care provider.  Do not take aspirin because it can make you bleed.  Keep all follow-up appointments as directed by your health care provider. SEEK MEDICAL CARE IF:  You have any vaginal bleeding during any part of your pregnancy.  You have cramps or labor pains.  You have a fever, not controlled by medicine. SEEK IMMEDIATE MEDICAL CARE IF:   You have severe cramps in your back or belly (abdomen).  You pass large clots or tissue from your vagina.  Your bleeding increases.  You feel light-headed or weak, or you have fainting episodes.  You have chills.  You are leaking fluid or have a gush of fluid from your vagina.  You pass out while having a bowel movement. MAKE SURE YOU:  Understand these instructions.  Will watch your condition.  Will get help right away if you are not doing well or get worse.   This information is not intended to replace advice given to you by your health care provider. Make sure you discuss any questions you have with your health care provider.   Document Released: 10/21/2004 Document Revised: 01/16/2013 Document Reviewed: 09/18/2012 Elsevier Interactive Patient Education [employer]2016 [employer] Inc.

## 2015-07-29 NOTE — ED Notes (Addendum)
Pt comes in for light pink and mucus like vaginal discharge. Pt states she is having pain in her right side. Pt was seen for the same last week and had a normal ultra sound. Last OB visit was over 1 month ago. Pt states she has been unable to eat, sleep, or drink.   Pt is currently [redacted] weeks pregnant.

## 2015-07-29 NOTE — ED Notes (Signed)
Patient states she wants to leave and not wait for ultrasound. Dr. Verdie MosherLiu informed.

## 2015-07-29 NOTE — ED Notes (Signed)
Patient refused US. Sated she felt better and wanted to leave.

## 2015-07-29 NOTE — ED Provider Notes (Signed)
CSN: [id number]     Arrival date & time 07/29/15  0865 History  By signing my name below, I, Emmanuella Mensah, attest that this documentation has been prepared under the direction and in the presence of Lavera Guise, MD. Electronically Signed: Angelene Giovanni, ED Scribe. 07/29/2015. 11:20 AM.      Chief Complaint  Patient presents with  . Vaginal Bleeding   The history is provided by the patient. No language interpreter was used.   HPI Comments: Joyce Ward is a 30 y.o. female who is [redacted] weeks pregnant with a hx of endometriosis and ovarian cyst (2 x 2 cm s/p [place] 2 weeks ago) presents to the Emergency Department complaining of an episode of vaginal spotting of pink gel-like mucous that occured this am while pt was wiping. She adds that she has not been able to eat appropriately and has difficulty falling asleep. No alleviating factors noted. Pt states that she has tried Ibuprofen and tylenol with no relief. She reports that she has been having intermittent light vaginal bleeding with this pregnancy. She denies any vomiting, diarrhea, fever, or urinary symptoms. Pt has upcoming appointment with her OB GYN on 08/04/15.   Past Medical History  Diagnosis Date  . Endometriosis   . GERD (gastroesophageal reflux disease)   . Pelvic pain in female 08/19/2014  . History of endometriosis 08/19/2014  . Irregular intermenstrual bleeding 08/19/2014  . History of prescription drug abuse (HCC) 2016  . Depression   . Bronchitis   . UTI (urinary tract infection)    Past Surgical History  Procedure Laterality Date  . Laparoscopy     Family History  Problem Relation Age of Onset  . Endometriosis Mother    Social History  Substance Use Topics  . Smoking status: Current Some Day Smoker -- 1.00 packs/day for 5 years    Types: Cigarettes  . Smokeless tobacco: Never Used  . Alcohol Use: Yes     Comment: occasional   OB History    Gravida Para Term Preterm AB TAB SAB Ectopic Multiple Living   Review of Systems  10/14 systems reviewed and are negative other than those stated in the HPI   Allergies  Codeine; Latex; Naproxen; Toradol; and Tramadol  Home Medications   Prior to Admission medications   Medication Sig Start Date End Date Taking? Authorizing Provider  acetaminophen (TYLENOL) 500 MG tablet Take 1,000 mg by mouth every 6 (six) hours as needed for mild pain.   Yes Historical Provider, MD  ibuprofen (ADVIL,MOTRIN) 200 MG tablet Take 800 mg by mouth every 6 (six) hours as needed.   Yes Historical Provider, MD  Prenatal Vit-Fe Fumarate-FA (PRENATAL MULTIVITAMIN) TABS tablet Take 1 tablet by mouth daily at 12 noon. For low Vitamin 06/02/15  Yes Sanjuana Kava, NP  dicyclomine (BENTYL) 20 MG tablet Take 1 tablet (20 mg total) by mouth 2 (two) times daily. Patient not taking: Reported on 07/29/2015 06/23/15   Judeth Horn, NP  docusate sodium (COLACE) 100 MG capsule Take 1 capsule (100 mg total) by mouth 2 (two) times daily. Patient not taking: Reported on 07/29/2015 06/23/15   Judeth Horn, NP  HYDROcodone-acetaminophen (NORCO/VICODIN) 5-325 MG tablet Take 1 tablet by mouth every 6 (six) hours as needed for moderate pain. Patient not taking: Reported on 07/29/2015 07/19/15   Bethann Berkshire, MD  ondansetron (ZOFRAN ODT) 4 MG disintegrating tablet  ODT q4 hours prn  nausea/vomit Patient not taking: Reported on 07/29/2015 07/06/15   Marily MemosJason Mesner, MD   BP 110/78 mmHg  Pulse 65  Temp(Src) 97.9 F (36.6 C) (Oral)  Resp 16  Ht 5\' 4"  (1.626 m)  Wt 127 lb (57.607 kg)  BMI 21.79 kg/m2  SpO2 100%  LMP 05/12/2015 Physical Exam  Nursing note and vitals reviewed. Physical Exam  Nursing note and vitals reviewed. Constitutional: Well developed, well nourished, non-toxic, and in no acute distress Head: Normocephalic and atraumatic.  Mouth/Throat: Oropharynx is clear and moist.  Neck: Normal range of motion. Neck supple.  Cardiovascular: Normal rate and regular rhythm.    Pulmonary/Chest: Effort normal and breath sounds normal.  Abdominal: Soft. There is low abdominal tenderness. There is no rebound and no guarding.  Musculoskeletal: Normal range of motion.  Neurological: Alert, no facial droop, fluent speech, moves all extremities symmetrically Skin: Skin is warm and dry.  Psychiatric: Cooperative  ED Course  Procedures (including critical care time) DIAGNOSTIC STUDIES: Oxygen Saturation is 100% on RA, normal by my interpretation.    COORDINATION OF CARE: 11:10 AM- Pt advised of plan for treatment and pt agrees. Pt will receive pelvic examination and lab work for further evaluation. She will also receive IV fluids. Pt reports that she has been taking ibuprofen and advised to speak to OB during her follow up appointment.    Labs Review Labs Reviewed  CBC WITH DIFFERENTIAL/PLATELET - Abnormal; Notable for the following:    WBC 12.7 (*)    Neutro Abs 9.5 (*)    All other components within normal limits  BASIC METABOLIC PANEL - Abnormal; Notable for the following:    Sodium 134 (*)    BUN 5 (*)    Calcium 8.3 (*)    Anion gap 4 (*)    All other components within normal limits  URINALYSIS, ROUTINE W REFLEX MICROSCOPIC (NOT [facility]) - Abnormal; Notable for the following:    Specific Gravity, Urine <1.005 (*)    Leukocytes, UA SMALL (*)    All other components within normal limits  URINE MICROSCOPIC-ADD ON - Abnormal; Notable for the following:    Squamous Epithelial / LPF TOO NUMEROUS TO COUNT (*)    Bacteria, UA FEW (*)    All other components within normal limits    Lavera Guiseana Duo Tamiya Colello, MD has personally reviewed and evaluated these lab results as part of her medical decision-making.   MDM   Final diagnoses:  Threatened miscarriage  Pelvic pain affecting pregnancy   Patient seen on 07/19/2015 for vaginal bleeding and pelvic pain. With Documented intrauterine pregnancy on ultrasound at 13 weeks on that day, with right ovarian cyst. She was told that  it was her cyst was causing her pain. Has been taking both ibuprofen and Tylenol for pain control to no good effect. Discussed with her that ibuprofen during her first trimester pregnancy is not recommended. On chart review, her prior gyneocologists have had concern for pain seeking qualities. She was just discharged with course of vicodin on last visit.  She is well appearing and in no acute distress. Vital signs within normal limits and abdomen soft and non-peritoneal with low abdominal tenderness. Discussed implications of vaginal bleeding in pregnancy and diagnosis of threatened miscarriage. She expressed concern when I told her that I would not be sending her home with a course of Vicodin and kept insisting that the previous provider I have sent her home so she does not understand why I will not. Discussed with her  that she will need to see her OB doctor for further pain management. However I did want to investigate her vaginal bleeding and pelvic pain further. Her blood work shows improving leukocytosis. She was given IV fluids and 1 dose of morphine to be able to perform a pelvic exam and ultrasound. However when I reevaluate her she states that she does not want pelvic exam or repeat ultrasound to be performed until more IV pain medications are given. I told her that I need to perform exam but will at this time hold further examination. She expressed that she would like to be discharged and continue investigation with her OB physician. Strict return and follow-up instructions reviewed. She expressed understanding of all discharge instructions and felt comfortable with the plan of care.   I personally performed the services described in this documentation, which was scribed in my presence. The recorded information has been reviewed and is accurate.    Lavera Guiseana Duo Elkin Belfield, MD 07/29/15 1329

## 2015-09-01 ENCOUNTER — Encounter: Payer: Self-pay | Admitting: Family Medicine

## 2015-10-22 ENCOUNTER — Encounter: Payer: Self-pay | Admitting: Advanced Practice Midwife

## 2015-10-27 ENCOUNTER — Emergency Department (HOSPITAL_COMMUNITY)
Admission: EM | Admit: 2015-10-27 | Discharge: 2015-10-27 | Disposition: A | Discharge status: Home / Self Care | Payer: Medicaid Other | Attending: Dermatology | Admitting: Dermatology

## 2015-10-27 ENCOUNTER — Encounter (HOSPITAL_COMMUNITY): Payer: Self-pay | Admitting: Emergency Medicine

## 2015-10-27 DIAGNOSIS — Z79899 Other long term (current) drug therapy: Secondary | ICD-10-CM | POA: Diagnosis not present

## 2015-10-27 DIAGNOSIS — Z3A27 27 weeks gestation of pregnancy: Secondary | ICD-10-CM | POA: Insufficient documentation

## 2015-10-27 DIAGNOSIS — Z5321 Procedure and treatment not carried out due to patient leaving prior to being seen by health care provider: Secondary | ICD-10-CM | POA: Insufficient documentation

## 2015-10-27 DIAGNOSIS — O2693 Pregnancy related conditions, unspecified, third trimester: Secondary | ICD-10-CM | POA: Diagnosis not present

## 2015-10-27 DIAGNOSIS — O99333 Smoking (tobacco) complicating pregnancy, third trimester: Secondary | ICD-10-CM | POA: Insufficient documentation

## 2015-10-27 DIAGNOSIS — F17210 Nicotine dependence, cigarettes, uncomplicated: Secondary | ICD-10-CM | POA: Insufficient documentation

## 2015-10-27 DIAGNOSIS — R109 Unspecified abdominal pain: Secondary | ICD-10-CM | POA: Diagnosis not present

## 2015-10-27 NOTE — ED Triage Notes (Signed)
Pt states she is 7 months pregnant, having abdominal cramping. No bleeding.

## 2015-10-27 NOTE — ED Notes (Signed)
Pt not in waiting room

## 2015-11-03 ENCOUNTER — Encounter: Payer: Self-pay | Admitting: Family Medicine

## 2015-11-12 ENCOUNTER — Telehealth: Payer: Self-pay | Admitting: *Deleted

## 2015-11-12 ENCOUNTER — Other Ambulatory Visit: Payer: Self-pay | Admitting: Family

## 2015-11-12 ENCOUNTER — Encounter: Payer: Self-pay | Admitting: Family

## 2015-11-12 ENCOUNTER — Ambulatory Visit (INDEPENDENT_AMBULATORY_CARE_PROVIDER_SITE_OTHER): Payer: Medicaid Other | Admitting: Family

## 2015-11-12 ENCOUNTER — Other Ambulatory Visit (HOSPITAL_COMMUNITY)
Admission: RE | Admit: 2015-11-12 | Discharge: 2015-11-12 | Disposition: A | Discharge status: Home / Self Care | Payer: Medicaid Other | Source: Ambulatory Visit | Attending: Family | Admitting: Family

## 2015-11-12 VITALS — BP 101/70 | HR 103 | Wt 131.8 lb

## 2015-11-12 DIAGNOSIS — Z124 Encounter for screening for malignant neoplasm of cervix: Secondary | ICD-10-CM | POA: Diagnosis not present

## 2015-11-12 DIAGNOSIS — O0990 Supervision of high risk pregnancy, unspecified, unspecified trimester: Secondary | ICD-10-CM | POA: Insufficient documentation

## 2015-11-12 DIAGNOSIS — O0993 Supervision of high risk pregnancy, unspecified, third trimester: Secondary | ICD-10-CM

## 2015-11-12 DIAGNOSIS — F5101 Primary insomnia: Secondary | ICD-10-CM

## 2015-11-12 DIAGNOSIS — O26893 Other specified pregnancy related conditions, third trimester: Secondary | ICD-10-CM | POA: Diagnosis not present

## 2015-11-12 DIAGNOSIS — Z01419 Encounter for gynecological examination (general) (routine) without abnormal findings: Secondary | ICD-10-CM | POA: Diagnosis not present

## 2015-11-12 DIAGNOSIS — Z113 Encounter for screening for infections with a predominantly sexual mode of transmission: Secondary | ICD-10-CM

## 2015-11-12 DIAGNOSIS — R109 Unspecified abdominal pain: Secondary | ICD-10-CM | POA: Diagnosis not present

## 2015-11-12 DIAGNOSIS — O26899 Other specified pregnancy related conditions, unspecified trimester: Secondary | ICD-10-CM

## 2015-11-12 LAB — POCT URINALYSIS DIP (DEVICE)
Bilirubin Urine: NEGATIVE
Glucose, UA: 250 mg/dL — AB
Hgb urine dipstick: NEGATIVE
Ketones, ur: NEGATIVE mg/dL
Nitrite: NEGATIVE
Protein, ur: NEGATIVE mg/dL
Specific Gravity, Urine: 1.02 (ref 1.005–1.030)
Urobilinogen, UA: 0.2 mg/dL (ref 0.0–1.0)
pH: 7 (ref 5.0–8.0)

## 2015-11-12 MED ORDER — HYDROXYZINE HCL 25 MG PO TABS: 25.0000 mg | ORAL_TABLET | Freq: Every evening | ORAL | 1 refills | Status: DC | PRN

## 2015-11-12 NOTE — Patient Instructions (Addendum)
Third Trimester of Pregnancy The third trimester is from week 29 through week 42, months 7 through 9. The third trimester is a time when the fetus is growing rapidly. At the end of the ninth month, the fetus is about 20 inches in length and weighs 6-10 pounds.  BODY CHANGES Your body goes through many changes during pregnancy. The changes vary from woman to woman.   Your weight will continue to increase. You can expect to gain 25-35 pounds (11-16 kg) by the end of the pregnancy.  You may begin to get stretch marks on your hips, abdomen, and breasts.  You may urinate more often because the fetus is moving lower into your pelvis and pressing on your bladder.  You may develop or continue to have heartburn as a result of your pregnancy.  You may develop constipation because certain hormones are causing the muscles that push waste through your intestines to slow down.  You may develop hemorrhoids or swollen, bulging veins (varicose veins).  You may have pelvic pain because of the weight gain and pregnancy hormones relaxing your joints between the bones in your pelvis. Backaches may result from overexertion of the muscles supporting your posture.  You may have changes in your hair. These can include thickening of your hair, rapid growth, and changes in texture. Some women also have hair loss during or after pregnancy, or hair that feels dry or thin. Your hair will most likely return to normal after your baby is born.  Your breasts will continue to grow and be tender. A yellow discharge may leak from your breasts called colostrum.  Your belly button may stick out.  You may feel short of breath because of your expanding uterus.  You may notice the fetus "dropping," or moving lower in your abdomen.  You may have a bloody mucus discharge. This usually occurs a few days to a week before labor begins.  Your cervix becomes thin and soft (effaced) near your due date. WHAT TO EXPECT AT YOUR  PRENATAL EXAMS  You will have prenatal exams every 2 weeks until week 36. Then, you will have weekly prenatal exams. During a routine prenatal visit:  You will be weighed to make sure you and the fetus are growing normally.  Your blood pressure is taken.  Your abdomen will be measured to track your baby's growth.  The fetal heartbeat will be listened to.  Any test results from the previous visit will be discussed.  You may have a cervical check near your due date to see if you have effaced. At around 36 weeks, your caregiver will check your cervix. At the same time, your caregiver will also perform a test on the secretions of the vaginal tissue. This test is to determine if a type of bacteria, Group B streptococcus, is present. Your caregiver will explain this further. Your caregiver may ask you:  What your birth plan is.  How you are feeling.  If you are feeling the baby move.  If you have had any abnormal symptoms, such as leaking fluid, bleeding, severe headaches, or abdominal cramping.  If you are using any tobacco products, including cigarettes, chewing tobacco, and electronic cigarettes.  If you have any questions. Other tests or screenings that may be performed during your third trimester include:  Blood tests that check for low iron levels (anemia).  Fetal testing to check the health, activity level, and growth of the fetus. Testing is done if you have certain medical conditions or if   there are problems during the pregnancy.  HIV (human immunodeficiency virus) testing. If you are at high risk, you may be screened for HIV during your third trimester of pregnancy. FALSE LABOR You may feel small, irregular contractions that eventually go away. These are called Braxton Hicks contractions, or false labor. Contractions may last for hours, days, or even weeks before true labor sets in. If contractions come at regular intervals, intensify, or become painful, it is best to be seen  by your caregiver.  SIGNS OF LABOR   Menstrual-like cramps.  Contractions that are 5 minutes apart or less.  Contractions that start on the top of the uterus and spread down to the lower abdomen and back.  A sense of increased pelvic pressure or back pain.  A watery or bloody mucus discharge that comes from the vagina. If you have any of these signs before the 37th week of pregnancy, call your caregiver right away. You need to go to the hospital to get checked immediately. HOME CARE INSTRUCTIONS   Avoid all smoking, herbs, alcohol, and unprescribed drugs. These chemicals affect the formation and growth of the baby.  Do not use any tobacco products, including cigarettes, chewing tobacco, and electronic cigarettes. If you need help quitting, ask your health care provider. You may receive counseling support and other resources to help you quit.  Follow your caregiver's instructions regarding medicine use. There are medicines that are either safe or unsafe to take during pregnancy.  Exercise only as directed by your caregiver. Experiencing uterine cramps is a good sign to stop exercising.  Continue to eat regular, healthy meals.  Wear a good support bra for breast tenderness.  Do not use hot tubs, steam rooms, or saunas.  Wear your seat belt at all times when driving.  Avoid raw meat, uncooked cheese, cat litter boxes, and soil used by cats. These carry germs that can cause birth defects in the baby.  Take your prenatal vitamins.  Take 1500-2000 mg of calcium daily starting at the 20th week of pregnancy until you deliver your baby.  Try taking a stool softener (if your caregiver approves) if you develop constipation. Eat more high-fiber foods, such as fresh vegetables or fruit and whole grains. Drink plenty of fluids to keep your urine clear or pale yellow.  Take warm sitz baths to soothe any pain or discomfort caused by hemorrhoids. Use hemorrhoid cream if your caregiver  approves.  If you develop varicose veins, wear support hose. Elevate your feet for 15 minutes, 3-4 times a day. Limit salt in your diet.  Avoid heavy lifting, wear low heal shoes, and practice good posture.  Rest a lot with your legs elevated if you have leg cramps or low back pain.  Visit your dentist if you have not gone during your pregnancy. Use a soft toothbrush to brush your teeth and be gentle when you floss.  A sexual relationship may be continued unless your caregiver directs you otherwise.  Do not travel far distances unless it is absolutely necessary and only with the approval of your caregiver.  Take prenatal classes to understand, practice, and ask questions about the labor and delivery.  Make a trial run to the hospital.  Pack your hospital bag.  Prepare the baby's nursery.  Continue to go to all your prenatal visits as directed by your caregiver. SEEK MEDICAL CARE IF:  You are unsure if you are in labor or if your water has broken.  You have dizziness.  You have   mild pelvic cramps, pelvic pressure, or nagging pain in your abdominal area.  You have persistent nausea, vomiting, or diarrhea.  You have a bad smelling vaginal discharge.  You have pain with urination. SEEK IMMEDIATE MEDICAL CARE IF:   You have a fever.  You are leaking fluid from your vagina.  You have spotting or bleeding from your vagina.  You have severe abdominal cramping or pain.  You have rapid weight loss or gain.  You have shortness of breath with chest pain.  You notice sudden or extreme swelling of your face, hands, ankles, feet, or legs.  You have not felt your baby move in over an hour.  You have severe headaches that do not go away with medicine.  You have vision changes.   This information is not intended to replace advice given to you by your health care provider. Make sure you discuss any questions you have with your health care provider.   Document Released:  01/05/2001 Document Revised: 02/01/2014 Document Reviewed: 03/14/2012 Elsevier Interactive Patient Education 2016 Elsevier Inc[employer]  AREA PEDIATRIC/FAMILY PRACTICE PHYSICIANS  [facility] [address], [address] [place], [place]  [address] Phone - [phone]   Fax - [phone]  [facility] [address] [place], [place] [address] Phone - [phone]   Fax - [phone]  [facility] [address] [place], [place]  [address] Phone - [phone]   Fax - [phone]  [facility] [address] [place], [place]  [address] Phone - [phone]   Fax - [phone]  [facility] [address] [place], [place]  [address] Phone - [phone]   Fax - [phone]  [facility] [address] [place], [place]  [address] Phone - [phone]   Fax - [phone]  [facility] [address] [place], [place]  [address] Phone - [phone]   Fax - [phone]  [facility] [address] [place], [place]  [address] Phone - [phone]   Fax - [phone]  [facility] [address] [place], [place]  [address] Phone - [phone]   Fax - [phone] [facility] [address] [place], [place]  [address] Phone - [phone]   Fax - [phone]  [facility] [address] [place], [place]  [address] Phone - [phone]   Fax - [phone]  [facility] [address] [place], [place]  [address] Phone - [phone]   Fax - [phone]  [facility] [address] [place], [place]  [address] Phone - [phone]   Fax - [phone]  SHILPA GOSRANI [address] [place], [place]  [address] Phone - [phone]  [facility] [address] [place], [place]  [address] Phone - [phone]   Fax - [phone]  [facility] [address] [place], [place]  [address] Phone - [phone]   Fax - [phone]  [facility] [address] [place], [place]  [address] Phone - [phone]   Fax - [phone]  [facility] [address] [place], [place]  [address] Phone - [phone]   Fax - [phone]   [facility] [address] [place],   [address] Phone - [phone]   Fax - [phone]  [facility] [address] [place], [place]  [address] Phone - [phone]   Fax - [phone]  DAVID RUBIN [address] [place], [place]  [address] Phone - [phone]   Fax - [phone]  [facility] [address] [place], [place]  [address] Phone - [phone]   Fax - [phone]  [facility] [address] [place], [place]  [address] Phone - [phone]   Fax - [phone] [facility] [place] [address] [place], [place]  [address] Phone - [phone]   Fax - [phone]  [facility] [address] [place], [place]  [address] Phone - [phone]   Fax - [phone]  [facility] [place] [address] [place], [place]  [address] Phone - [phone]   Fax - [phone]

## 2015-11-12 NOTE — Telephone Encounter (Addendum)
Pt left message stating that she was here earlier today for an appt. She has questions about a prescription and requests a call back.  I called pt and heard message stating that the person reached has voice mail which has not been set up yet.   10/19  1127  Pt left message again today @ 0848 stating that she has questions about medication. I called back now and she did not answer. I again heard the message stating that pt does not have voice mail set up.

## 2015-11-12 NOTE — Progress Notes (Signed)
Subjective:    Joyce QuinceOnnalee Fedrick is a [id number] 3525w1d being seen today for her first obstetrical visit.  Here with her father.  Her obstetrical history is significant for late prenatal care. Patient does not intend to breast feed. Pregnancy history fully reviewed.  Patient reports difficulty with sleeping.  Has tried Benadryl, Tylenol PM with little relief.  Also has problems with pancreas with severe upper abdominal pain.  Takes ibuprofen to relieve.Marland Kitchen.  History reports problem with opiate abuse "managed by father" who's present for visit.    Vitals:   11/12/15 0922  BP: 101/70  Pulse: (!) 103  Weight: 131 lb 12.8 oz (59.8 kg)    HISTORY: OB History  Gravida Para Term Preterm AB Living  5 2 2   2 2   SAB TAB Ectopic Multiple Live Births  2            # Outcome Date GA Lbr Len/2nd Weight Sex Delivery Anes PTL Lv  5 Current           4 Term 09/01/13 3768w0d   F Vag-Spont        Birth Comments: no complications, [facility] [place], [place]  3 Term 07/01/12 6768w0d   M Vag-Spont        Birth Comments: had meconium before delivery, [facility] [place]  2 SAB 01/2010          1 SAB 01/2009             Past Medical History:  Diagnosis Date  . Bronchitis   . Depression   . Endometriosis   . GERD (gastroesophageal reflux disease)   . History of endometriosis 08/19/2014  . History of prescription drug abuse (HCC) 2016  . Irregular intermenstrual bleeding 08/19/2014  . Pelvic pain in female 08/19/2014  . UTI (urinary tract infection)    Past Surgical History:  Procedure Laterality Date  . LAPAROSCOPY     Family History  Problem Relation Age of Onset  . Endometriosis Mother      Exam   BP 101/70   Pulse (!) 103   Wt 131 lb 12.8 oz (59.8 kg)   LMP 05/12/2015   BMI 23.35 kg/m   General:  Father of patient answers most questions; patient instructs him to "be quiet" occasionally.    Uterine Size: size equals dates  Pelvic Exam:    Perineum: No Hemorrhoids,  Normal Perineum   Vulva: normal   Vagina:  normal mucosa, normal discharge, no palpable nodules   pH: Not done   Cervix: no bleeding following Pap, no cervical motion tenderness and no lesions; difficult pelvic exam due to increased anxiety of patient   Adnexa: normal adnexa and no mass, fullness, tenderness   Bony Pelvis: Adequate  System: Breast:  No nipple retraction or dimpling, No nipple discharge or bleeding, No axillary or supraclavicular adenopathy, Normal to palpation without dominant masses   Skin: normal coloration and turgor, no rashes    Neurologic: negative   Extremities: normal strength, tone, and muscle mass   HEENT neck supple with midline trachea and thyroid without masses   Mouth/Teeth mucous membranes moist, pharynx normal without lesions   Neck supple and no masses   Cardiovascular: regular rate and rhythm, no murmurs or gallops   Respiratory:  appears well, vitals normal, no respiratory distress, acyanotic, normal RR, neck free of mass or lymphadenopathy, chest clear, no wheezing, crepitations, rhonchi, normal symmetric air entry   Abdomen: soft, non-tender; bowel  sounds normal; no masses,  no organomegaly   Urinary: urethral meatus normal      Assessment:    Pregnancy: [id number] Patient Active Problem List   Diagnosis Date Noted  . Supervision of high risk pregnancy, antepartum 11/12/2015  . Alcohol abuse 06/01/2015  . Major depressive disorder, single episode, severe without psychotic features (HCC) 05/31/2015  . Opiate use 12/19/2014  . Dysmenorrhea 12/09/2014  . Allergic reaction caused by a drug 10/02/2014  . Pelvic pain in female 08/19/2014  . History of endometriosis 08/19/2014  . Irregular intermenstrual bleeding 08/19/2014        Plan:     Initial labs drawn.  Amylase and lipase added. Plans to meet with Asher Muir with father of patient not present.   Explained must continue ibuprofen, consider referral to GI. Prenatal vitamins. Problem list  reviewed and updated. Genetic Screening:  Late to care  Ultrasound discussed; fetal survey: ordered.  Follow up in 2 weeks.   Marlis Edelson 11/12/2015

## 2015-11-12 NOTE — Progress Notes (Signed)
Here for intial prenatal visit. States has been taking ibuprofen pain- upper abdomen and pelvic pain. Instructed patient to stop motrin and discuss with provider today. Given patient education booklets. Declines flu shot and tdap.

## 2015-11-13 LAB — PRENATAL PROFILE (SOLSTAS)
Antibody Screen: NEGATIVE
BASOS PCT: 0 %
Basophils Absolute: 0 cells/uL (ref 0–200)
EOS ABS: 0 {cells}/uL — AB (ref 15–500)
Eosinophils Relative: 0 %
HEMATOCRIT: 33.1 % — AB (ref 35.0–45.0)
HEP B S AG: NEGATIVE
HIV: NONREACTIVE
Hemoglobin: 11.3 g/dL — ABNORMAL LOW (ref 11.7–15.5)
LYMPHS ABS: 2040 {cells}/uL (ref 850–3900)
LYMPHS PCT: 15 %
MCH: 32.5 pg (ref 27.0–33.0)
MCHC: 34.1 g/dL (ref 32.0–36.0)
MCV: 95.1 fL (ref 80.0–100.0)
MONO ABS: 544 {cells}/uL (ref 200–950)
MPV: 9.8 fL (ref 7.5–12.5)
Monocytes Relative: 4 %
NEUTROS ABS: 11016 {cells}/uL — AB (ref 1500–7800)
Neutrophils Relative %: 81 %
Platelets: 313 10*3/uL (ref 140–400)
RBC: 3.48 MIL/uL — AB (ref 3.80–5.10)
RDW: 13.3 % (ref 11.0–15.0)
RH TYPE: POSITIVE
Rubella: 1.06 Index — ABNORMAL HIGH (ref <0.90)
WBC: 13.6 10*3/uL — AB (ref 3.8–10.8)

## 2015-11-13 LAB — CULTURE, OB URINE

## 2015-11-13 LAB — GLUCOSE TOLERANCE, 1 HOUR (50G) W/O FASTING: Glucose, 1 Hr, gestational: 122 mg/dL (ref <140)

## 2015-11-14 LAB — CYTOLOGY - PAP
ADEQUACY: ABSENT
Diagnosis: NEGATIVE

## 2015-11-14 LAB — LIPASE: Lipase: 14 U/L (ref 7–60)

## 2015-11-14 LAB — AMYLASE: Amylase: 45 U/L (ref 0–105)

## 2015-11-16 LAB — PAIN MGMT, PROFILE 6 CONF W/O MM, U
6 Acetylmorphine: NEGATIVE ng/mL (ref <10)
AMPHETAMINES: NEGATIVE ng/mL (ref <500)
Alcohol Metabolites: NEGATIVE ng/mL (ref <500)
BARBITURATES: NEGATIVE ng/mL (ref <300)
Benzodiazepines: NEGATIVE ng/mL (ref <100)
CREATININE: 42.7 mg/dL (ref >=20.0)
Cocaine Metabolite: NEGATIVE ng/mL (ref <150)
Codeine: NEGATIVE ng/mL (ref <50)
HYDROCODONE: NEGATIVE ng/mL (ref <50)
Hydromorphone: NEGATIVE ng/mL (ref <50)
METHADONE METABOLITE: NEGATIVE ng/mL (ref <100)
Marijuana Metabolite: NEGATIVE ng/mL (ref <20)
Morphine: 1367 ng/mL — ABNORMAL HIGH (ref <50)
NOROXYCODONE: 83 ng/mL — AB (ref <50)
Norhydrocodone: NEGATIVE ng/mL (ref <50)
OPIATES: POSITIVE ng/mL — AB (ref <100)
OXIDANT: NEGATIVE ug/mL (ref <200)
OXYCODONE: POSITIVE ng/mL — AB (ref <100)
OXYMORPHONE: 132 ng/mL — AB (ref <50)
Oxycodone: NEGATIVE ng/mL (ref <50)
PH: 7.31 (ref 4.5–9.0)
PLEASE NOTE: 0
Phencyclidine: NEGATIVE ng/mL (ref <25)

## 2015-11-17 ENCOUNTER — Encounter: Payer: Self-pay | Admitting: Family

## 2015-11-17 DIAGNOSIS — O99320 Drug use complicating pregnancy, unspecified trimester: Secondary | ICD-10-CM

## 2015-11-17 DIAGNOSIS — F1910 Other psychoactive substance abuse, uncomplicated: Secondary | ICD-10-CM | POA: Insufficient documentation

## 2015-11-17 HISTORY — DX: Drug use complicating pregnancy, unspecified trimester: F19.10

## 2015-11-20 ENCOUNTER — Encounter (HOSPITAL_COMMUNITY): Payer: Self-pay

## 2015-11-20 ENCOUNTER — Inpatient Hospital Stay: Payer: Self-pay

## 2015-11-20 ENCOUNTER — Observation Stay (HOSPITAL_COMMUNITY)
Admission: AD | Admit: 2015-11-20 | Discharge: 2015-11-21 | Disposition: A | Discharge status: Home / Self Care | Payer: Medicaid Other | Source: Ambulatory Visit | Attending: Obstetrics and Gynecology | Admitting: Obstetrics and Gynecology

## 2015-11-20 ENCOUNTER — Inpatient Hospital Stay (HOSPITAL_COMMUNITY): Payer: Medicaid Other

## 2015-11-20 DIAGNOSIS — O99283 Endocrine, nutritional and metabolic diseases complicating pregnancy, third trimester: Secondary | ICD-10-CM | POA: Diagnosis not present

## 2015-11-20 DIAGNOSIS — O99613 Diseases of the digestive system complicating pregnancy, third trimester: Secondary | ICD-10-CM | POA: Insufficient documentation

## 2015-11-20 DIAGNOSIS — F329 Major depressive disorder, single episode, unspecified: Secondary | ICD-10-CM | POA: Diagnosis not present

## 2015-11-20 DIAGNOSIS — O99320 Drug use complicating pregnancy, unspecified trimester: Secondary | ICD-10-CM

## 2015-11-20 DIAGNOSIS — Z3A30 30 weeks gestation of pregnancy: Secondary | ICD-10-CM | POA: Insufficient documentation

## 2015-11-20 DIAGNOSIS — F17210 Nicotine dependence, cigarettes, uncomplicated: Secondary | ICD-10-CM | POA: Insufficient documentation

## 2015-11-20 DIAGNOSIS — Z885 Allergy status to narcotic agent status: Secondary | ICD-10-CM | POA: Insufficient documentation

## 2015-11-20 DIAGNOSIS — Z5321 Procedure and treatment not carried out due to patient leaving prior to being seen by health care provider: Secondary | ICD-10-CM | POA: Diagnosis not present

## 2015-11-20 DIAGNOSIS — Z888 Allergy status to other drugs, medicaments and biological substances status: Secondary | ICD-10-CM | POA: Insufficient documentation

## 2015-11-20 DIAGNOSIS — E860 Dehydration: Secondary | ICD-10-CM | POA: Insufficient documentation

## 2015-11-20 DIAGNOSIS — O99333 Smoking (tobacco) complicating pregnancy, third trimester: Secondary | ICD-10-CM | POA: Insufficient documentation

## 2015-11-20 DIAGNOSIS — O99343 Other mental disorders complicating pregnancy, third trimester: Secondary | ICD-10-CM | POA: Insufficient documentation

## 2015-11-20 DIAGNOSIS — O0990 Supervision of high risk pregnancy, unspecified, unspecified trimester: Secondary | ICD-10-CM

## 2015-11-20 DIAGNOSIS — F1910 Other psychoactive substance abuse, uncomplicated: Secondary | ICD-10-CM

## 2015-11-20 DIAGNOSIS — Z886 Allergy status to analgesic agent status: Secondary | ICD-10-CM | POA: Insufficient documentation

## 2015-11-20 DIAGNOSIS — K219 Gastro-esophageal reflux disease without esophagitis: Secondary | ICD-10-CM | POA: Diagnosis not present

## 2015-11-20 DIAGNOSIS — O26873 Cervical shortening, third trimester: Principal | ICD-10-CM | POA: Insufficient documentation

## 2015-11-20 DIAGNOSIS — O368330 Maternal care for abnormalities of the fetal heart rate or rhythm, third trimester, not applicable or unspecified: Secondary | ICD-10-CM | POA: Diagnosis not present

## 2015-11-20 DIAGNOSIS — O9989 Other specified diseases and conditions complicating pregnancy, childbirth and the puerperium: Secondary | ICD-10-CM | POA: Diagnosis present

## 2015-11-20 DIAGNOSIS — Z91040 Latex allergy status: Secondary | ICD-10-CM | POA: Diagnosis not present

## 2015-11-20 DIAGNOSIS — O368390 Maternal care for abnormalities of the fetal heart rate or rhythm, unspecified trimester, not applicable or unspecified: Secondary | ICD-10-CM

## 2015-11-20 LAB — URINALYSIS, ROUTINE W REFLEX MICROSCOPIC
Bilirubin Urine: NEGATIVE
GLUCOSE, UA: NEGATIVE mg/dL
Hgb urine dipstick: NEGATIVE
Ketones, ur: 15 mg/dL — AB
LEUKOCYTES UA: NEGATIVE
Nitrite: NEGATIVE
PH: 6.5 (ref 5.0–8.0)
Protein, ur: NEGATIVE mg/dL
Specific Gravity, Urine: 1.02 (ref 1.005–1.030)

## 2015-11-20 LAB — RAPID URINE DRUG SCREEN, HOSP PERFORMED
AMPHETAMINES: NOT DETECTED
BARBITURATES: NOT DETECTED
BENZODIAZEPINES: NOT DETECTED
Cocaine: NOT DETECTED
Opiates: NOT DETECTED
TETRAHYDROCANNABINOL: NOT DETECTED

## 2015-11-20 LAB — TYPE AND SCREEN
ABO/RH(D): B POS
Antibody Screen: NEGATIVE

## 2015-11-20 LAB — ABO/RH: ABO/RH(D): B POS

## 2015-11-20 MED ORDER — DOCUSATE SODIUM 100 MG PO CAPS: 100.0000 mg | ORAL_CAPSULE | Freq: Every day | ORAL | Status: DC

## 2015-11-20 MED ORDER — BETAMETHASONE SOD PHOS & ACET 6 (3-3) MG/ML IJ SUSP
12.0000 mg | INTRAMUSCULAR | Status: DC
  Administered 2015-11-20: 12 mg via INTRAMUSCULAR
  Filled 2015-11-20: qty 2

## 2015-11-20 MED ORDER — SODIUM CHLORIDE 0.9 % IV SOLN: INTRAVENOUS | Status: DC

## 2015-11-20 MED ORDER — CALCIUM CARBONATE ANTACID 500 MG PO CHEW: 2.0000 | CHEWABLE_TABLET | ORAL | Status: DC | PRN

## 2015-11-20 MED ORDER — SIMETHICONE 80 MG PO CHEW
80.0000 mg | CHEWABLE_TABLET | Freq: Once | ORAL | Status: AC
  Administered 2015-11-20: 80 mg via ORAL
  Filled 2015-11-20: qty 1

## 2015-11-20 MED ORDER — NIFEDIPINE 10 MG PO CAPS
10.0000 mg | ORAL_CAPSULE | Freq: Once | ORAL | Status: AC
  Administered 2015-11-20: 10 mg via ORAL
  Filled 2015-11-20: qty 1

## 2015-11-20 MED ORDER — ZOLPIDEM TARTRATE 5 MG PO TABS
5.0000 mg | ORAL_TABLET | Freq: Every evening | ORAL | Status: DC | PRN
  Administered 2015-11-20: 5 mg via ORAL
  Filled 2015-11-20: qty 1

## 2015-11-20 MED ORDER — ACETAMINOPHEN 325 MG PO TABS: 650.0000 mg | ORAL_TABLET | ORAL | Status: DC | PRN

## 2015-11-20 MED ORDER — SODIUM CHLORIDE 0.9 % IV SOLN
INTRAVENOUS | Status: DC
  Administered 2015-11-20 – 2015-11-21 (×2): via INTRAVENOUS

## 2015-11-20 MED ORDER — PRENATAL MULTIVITAMIN CH: 1.0000 | ORAL_TABLET | Freq: Every day | ORAL | Status: DC

## 2015-11-20 MED ORDER — NIFEDIPINE 10 MG PO CAPS
20.0000 mg | ORAL_CAPSULE | Freq: Once | ORAL | Status: AC
  Administered 2015-11-20: 20 mg via ORAL
  Filled 2015-11-20: qty 2

## 2015-11-20 MED ORDER — PROGESTERONE MICRONIZED 200 MG PO CAPS
200.0000 mg | ORAL_CAPSULE | Freq: Every day | ORAL | Status: DC
  Administered 2015-11-20: 200 mg via VAGINAL
  Filled 2015-11-20: qty 1

## 2015-11-20 MED ORDER — NIFEDIPINE 10 MG PO CAPS
10.0000 mg | ORAL_CAPSULE | Freq: Four times a day (QID) | ORAL | Status: DC
  Administered 2015-11-21 (×2): 10 mg via ORAL
  Filled 2015-11-20 (×2): qty 1

## 2015-11-20 NOTE — H&P (Signed)
Chief Complaint:  Abdominal Pain   First Provider Initiated Contact with Patient 11/20/15 1723     HPI: Joyce Ward is a 30 y.o. G5P2022 at 30w2dwho presents to maternity admissions reporting sharp lower abdominal pains for a week.  States they are severe and interfere with walking and sleeping.  Also had some light pink bleeding today.  No active flow or leaking of fluid  Her father is with her and does much of the talking for her.  When asked if her cervix was checked in the office, she asked her father if it was. Neither could remember.  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 Father is worried she will have a contraction and fall down.  I reviewed the lifting limitations letter we can provide, but they want me to write one saying she cannot work at all.     They also request something stronger for sleep. Were given Vistaril by the CNM last week and states she tried it for 4 days and did not work.  States does not nap in the day time and does not sleep at all at night.    She reports good fetal movement, denies LOF, vaginal bleeding, vaginal itching/burning, urinary symptoms, h/a, dizziness, n/v, diarrhea, constipation or fever/chills.  She denies headache, visual changes or RUQ abdominal pain.  States she was told at one time (outside doctor) that she needed to have her pancreas removed. She also states they did say something about her gallbladder when I stated it could be related to that.  I reviewed normal amylase/lipase done in clinic.  Abdominal Pain  This is a new problem. The current episode started in the past 7 days. The onset quality is gradual. The problem occurs intermittently. The problem has been unchanged. The pain is located in the LLQ, RLQ and suprapubic region. The pain is severe. The quality of the pain is cramping and sharp. The abdominal pain does not radiate.  Pertinent negatives include no constipation, diarrhea, dysuria, fever, headaches, myalgias, nausea or vomiting. The pain is aggravated by palpation, movement and certain positions. The pain is relieved by nothing. She has tried nothing for the symptoms.   RN Note: Pt reports a sharp apin to her lower abd that started a week ago. Pt reports it is difficult for her walk or sit. Good fetal movement reported.Pt alos stated she had some light bleeding when wiping today.  Past Medical History: Past Medical History:  Diagnosis Date  . Bronchitis   . Depression   . Endometriosis   . GERD (gastroesophageal reflux disease)   . History of endometriosis 08/19/2014  . History of prescription drug abuse (HCC) 2016  . Irregular intermenstrual bleeding 08/19/2014  . Pelvic pain in female 08/19/2014  . UTI (urinary tract infection)     Past obstetric history: OB History  Gravida Para Term Preterm AB Living  5 2 2   2 2  SAB TAB Ectopic Multiple Live Births  2            # Outcome Date GA Lbr Len/2nd Weight Sex Delivery Anes PTL Lv  5 Current           4 Term 09/01/13 [redacted]w[redacted]d   F Vag-Spont        Birth Comments: no complications, born [facility] in [place], [place]  3 Term 07/01/12 [redacted]w[redacted]d   M Vag-Spont          Birth Comments: had meconium before delivery, born at [facility] in [place], [place]  2 SAB 01/2010          1 SAB 01/2009              Past Surgical History: Past Surgical History:  Procedure Laterality Date  . LAPAROSCOPY      Family History: Family History  Problem Relation Age of Onset  . Endometriosis Mother     Social History: Social History  Substance Use Topics  . Smoking status: Current Some Day Smoker    Packs/day: 1.00    Years: 5.00    Types: Cigarettes  . Smokeless tobacco: Never Used  . Alcohol use Yes     Comment: occasional    Allergies:  Allergies  Allergen Reactions  . Codeine Hives and Swelling  . Latex Hives  . Naproxen Hives  . Toradol [Ketorolac  Tromethamine] Hives  . Tramadol Hives    Meds:  Prescriptions Prior to Admission  Medication Sig Dispense Refill Last Dose  . acetaminophen (TYLENOL) 500 MG tablet Take 1,000 mg by mouth every 6 (six) hours as needed for mild pain.   Taking  . dicyclomine (BENTYL) 20 MG tablet Take 1 tablet (20 mg total) by mouth 2 (two) times daily. (Patient not taking: Reported on 11/12/2015) 20 tablet 0 Not Taking  . diphenhydrAMINE (BENADRYL) 25 MG tablet Take 25 mg by mouth every 6 (six) hours as needed.   Taking  . docusate sodium (COLACE) 100 MG capsule Take 1 capsule (100 mg total) by mouth 2 (two) times daily. (Patient not taking: Reported on 11/12/2015) 30 capsule 0 Not Taking  . hydrOXYzine (ATARAX/VISTARIL) 25 MG tablet Take 1 tablet (25 mg total) by mouth at bedtime as needed for anxiety or itching. 30 tablet 1   . ibuprofen (ADVIL,MOTRIN) 200 MG tablet Take 800 mg by mouth every 6 (six) hours as needed.   Taking  . Prenatal Vit-Fe Fumarate-FA (PRENATAL MULTIVITAMIN) TABS tablet Take 1 tablet by mouth daily at 12 noon. For low Vitamin 30 tablet 0 Taking    I have reviewed patient's Past Medical Hx, Surgical Hx, Family Hx, Social Hx, medications and allergies.   ROS:  Review of Systems  Constitutional: Negative for fever.  Gastrointestinal: Positive for abdominal pain. Negative for constipation, diarrhea, nausea and vomiting.  Genitourinary: Negative for dysuria.  Musculoskeletal: Negative for myalgias.  Neurological: Negative for headaches.   Other systems negative  Physical Exam  Patient Vitals for the past 24 hrs:  BP Temp Pulse Resp Height Weight  11/20/15 1554 107/64 97.8 F (36.6 C) 102 18 5' 3" (1.6 m) 132 lb 1.9 oz (59.9 kg)   Constitutional: Well-developed, well-nourished female in no acute distress.  Cardiovascular: normal rate and rhythm Respiratory: normal effort, clear to auscultation bilaterally GI: Abd soft,  Very tender over bilateral round ligaments, gravid  appropriate for gestational age.   No rebound or guarding. MS: Extremities nontender, no edema, normal ROM Neurologic: Alert and oriented x 4.  GU: Neg CVAT.  PELVIC EXAM:  Cervix closed/50% (about 2.5cm)/vertex/-3  FHT:  Baseline 130 , moderate variability, accelerations present, two separate variable  Decelerations, one lasted 2-3 minutes to 80s, other one minute to 80s, spontaneous but second was with UC Contractions: q 2 mins Irregular, most are not true contractions, in that they only last 20-40 seconds each.     Labs: Results for orders placed or performed during the hospital encounter of 11/20/15 (from the past 24 hour(s))    Urinalysis, Routine w reflex microscopic (not at [facility])     Status: Abnormal   Collection Time: 11/20/15  5:00 PM  Result Value Ref Range   Color, Urine YELLOW YELLOW   APPearance HAZY (A) CLEAR   Specific Gravity, Urine 1.020 1.005 - 1.030   pH 6.5 5.0 - 8.0   Glucose, UA NEGATIVE NEGATIVE mg/dL   Hgb urine dipstick NEGATIVE NEGATIVE   Bilirubin Urine NEGATIVE NEGATIVE   Ketones, ur 15 (A) NEGATIVE mg/dL   Protein, ur NEGATIVE NEGATIVE mg/dL   Nitrite NEGATIVE NEGATIVE   Leukocytes, UA NEGATIVE NEGATIVE   B/POS/-- (10/18 1001)  Imaging:  No results found.  MAU Course/MDM: I have ordered labs and reviewed results.  NST reviewed  Will get BPP, AFI due to decels.    Consult Dr Ervin with presentation, exam findings and test results.  Treatments in MAU included Procardia which lightened cramping somewhat but did not resolve it.   US reviewed, did confirm cervix 2.17cm long (shortened)    AFI normal.   .    Assessment: 1. Non-reassuring fetal heart rate with late deceleration   2.     Preterm uterine irritability with cervical shortening 3.      Mild dehydration  Plan: Admit to Antenatal per Dr Ervin Patient left AMA just as I was talking to Dr Ervin, I called her and told her we would recommend she come back, which she did Will give Procardia q 6  hrs Will give Betamethasone Watch overnight for fetal monitoring also  Jameal Razzano CNM, MSN Certified Nurse-Midwife 11/20/2015 6:00 PM  

## 2015-11-20 NOTE — MAU Note (Signed)
Patient removed monitors and said she was leaving because she was tired of being here. Explained to patient that provider was on the phone with the attending deciding a plan. Patient said she didn't care and wanted to leave. Patient walked out of the hospital. Wynelle BourgeoisMarie Williams CNM notified. Provider called patient to explain she needed to be admitted.

## 2015-11-20 NOTE — MAU Note (Addendum)
Pt reports a sharp apin to her lower abd that started a week ago. Pt reports it is difficult for her walk or sit. Good fetal movement reported.Pt alos stated she had some light bleeding when wiping today.

## 2015-11-20 NOTE — MAU Note (Signed)
Patient is back after being called by the provider and is okay with being admitted and having an IV started. Monitors reapplied.

## 2015-11-20 NOTE — MAU Note (Signed)
Went to administer medications to patient and start her IV. Patient stated she did not want the IV because she knew it would take to long and she just didn't want it. I explained to the patient that her baby had 2 decelerations and that fluids would help prevent another one. Patient still declined an IV.

## 2015-11-20 NOTE — MAU Provider Note (Signed)
Chief Complaint:  Abdominal Pain   First Provider Initiated Contact with Patient 11/20/15 1723     HPI: Joyce Ward is a 30 y.o. [id number] at 2030w2dwho presents to maternity admissions reporting sharp lower abdominal pains for a week.  States they are severe and interfere with walking and sleeping.  Also had some light pink bleeding today.  No active flow or leaking of fluid  Her father is with her and does much of the talking for her.  When asked if her cervix was checked in the office, she asked her father if it was. Neither could remember.  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 Father is worried she will have a contraction and fall down.  I reviewed the lifting limitations letter we can provide, but they want me to write one saying she cannot work at all.     They also request something stronger for sleep. Were given Vistaril by the CNM last week and states she tried it for 4 days and did not work.  States does not nap in the day time and does not sleep at all at night.    She reports good fetal movement, denies LOF, vaginal bleeding, vaginal itching/burning, urinary symptoms, h/a, dizziness, n/v, diarrhea, constipation or fever/chills.  She denies headache, visual changes or RUQ abdominal pain.  States she was told at one time (outside doctor) that she needed to have her pancreas removed. She also states they did say something about her gallbladder when I stated it could be related to that.  I reviewed normal amylase/lipase done in clinic.  Abdominal Pain  This is a new problem. The current episode started in the past 7 days. The onset quality is gradual. The problem occurs intermittently. The problem has been unchanged. The pain is located in the LLQ, RLQ and suprapubic region. The pain is severe. The quality of the pain is cramping and sharp. The abdominal pain does not radiate.  Pertinent negatives include no constipation, diarrhea, dysuria, fever, headaches, myalgias, nausea or vomiting. The pain is aggravated by palpation, movement and certain positions. The pain is relieved by nothing. She has tried nothing for the symptoms.   RN Note: Pt reports a sharp apin to her lower abd that started a week ago. Pt reports it is difficult for her walk or sit. Good fetal movement reported.Pt alos stated she had some light bleeding when wiping today.  Past Medical History: Past Medical History:  Diagnosis Date  . Bronchitis   . Depression   . Endometriosis   . GERD (gastroesophageal reflux disease)   . History of endometriosis 08/19/2014  . History of prescription drug abuse (HCC) 2016  . Irregular intermenstrual bleeding 08/19/2014  . Pelvic pain in female 08/19/2014  . UTI (urinary tract infection)     Past obstetric history: OB History  Gravida Para Term Preterm AB Living  5 2 2   2 2   SAB TAB Ectopic Multiple Live Births  2            # Outcome Date GA Lbr Len/2nd Weight Sex Delivery Anes PTL Lv  5 Current           4 Term 09/01/13 1772w0d   F Vag-Spont        Birth Comments: no complications, [facility] [place], [place]  3 Term 07/01/12 4072w0d   M Vag-Spont  Birth Comments: had meconium before delivery, born at [facility] in [place], [place]  2 SAB 01/2010          1 SAB 01/2009              Past Surgical History: Past Surgical History:  Procedure Laterality Date  . LAPAROSCOPY      Family History: Family History  Problem Relation Age of Onset  . Endometriosis Mother     Social History: Social History  Substance Use Topics  . Smoking status: Current Some Day Smoker    Packs/day: 1.00    Years: 5.00    Types: Cigarettes  . Smokeless tobacco: Never Used  . Alcohol use Yes     Comment: occasional    Allergies:  Allergies  Allergen Reactions  . Codeine Hives and Swelling  . Latex Hives  . Naproxen Hives  . Toradol [Ketorolac  Tromethamine] Hives  . Tramadol Hives    Meds:  Prescriptions Prior to Admission  Medication Sig Dispense Refill Last Dose  . acetaminophen (TYLENOL) 500 MG tablet Take 1,000 mg by mouth every 6 (six) hours as needed for mild pain.   Taking  . dicyclomine (BENTYL) 20 MG tablet Take 1 tablet (20 mg total) by mouth 2 (two) times daily. (Patient not taking: Reported on 11/12/2015) 20 tablet 0 Not Taking  . diphenhydrAMINE (BENADRYL) 25 MG tablet Take 25 mg by mouth every 6 (six) hours as needed.   Taking  . docusate sodium (COLACE) 100 MG capsule Take 1 capsule (100 mg total) by mouth 2 (two) times daily. (Patient not taking: Reported on 11/12/2015) 30 capsule 0 Not Taking  . hydrOXYzine (ATARAX/VISTARIL) 25 MG tablet Take 1 tablet (25 mg total) by mouth at bedtime as needed for anxiety or itching. 30 tablet 1   . ibuprofen (ADVIL,MOTRIN) 200 MG tablet Take 800 mg by mouth every 6 (six) hours as needed.   Taking  . Prenatal Vit-Fe Fumarate-FA (PRENATAL MULTIVITAMIN) TABS tablet Take 1 tablet by mouth daily at 12 noon. For low Vitamin 30 tablet 0 Taking    I have reviewed patient's Past Medical Hx, Surgical Hx, Family Hx, Social Hx, medications and allergies.   ROS:  Review of Systems  Constitutional: Negative for fever.  Gastrointestinal: Positive for abdominal pain. Negative for constipation, diarrhea, nausea and vomiting.  Genitourinary: Negative for dysuria.  Musculoskeletal: Negative for myalgias.  Neurological: Negative for headaches.   Other systems negative  Physical Exam  Patient Vitals for the past 24 hrs:  BP Temp Pulse Resp Height Weight  11/20/15 1554 107/64 97.8 F (36.6 C) 102 18 5\' 3"  (1.6 m) 132 lb 1.9 oz (59.9 kg)   Constitutional: Well-developed, well-nourished female in no acute distress.  Cardiovascular: normal rate and rhythm Respiratory: normal effort, clear to auscultation bilaterally GI: Abd soft,  Very tender over bilateral round ligaments, gravid  appropriate for gestational age.   No rebound or guarding. MS: Extremities nontender, no edema, normal ROM Neurologic: Alert and oriented x 4.  GU: Neg CVAT.  PELVIC EXAM:  Cervix closed/50% (about 2.5cm)/vertex/-3  FHT:  Baseline 130 , moderate variability, accelerations present, two separate variable  Decelerations, one lasted 2-3 minutes to 80s, other one minute to 80s, spontaneous but second was with UC Contractions: q 2 mins Irregular, most are not true contractions, in that they only last 20-40 seconds each.     Labs: Results for orders placed or performed during the hospital encounter of 11/20/15 (from the past 24 hour(s))  Urinalysis, Routine w reflex microscopic (not at [facility])     Status: Abnormal   Collection Time: 11/20/15  5:00 PM  Result Value Ref Range   Color, Urine YELLOW YELLOW   APPearance HAZY (A) CLEAR   Specific Gravity, Urine 1.020 1.005 - 1.030   pH 6.5 5.0 - 8.0   Glucose, UA NEGATIVE NEGATIVE mg/dL   Hgb urine dipstick NEGATIVE NEGATIVE   Bilirubin Urine NEGATIVE NEGATIVE   Ketones, ur 15 (A) NEGATIVE mg/dL   Protein, ur NEGATIVE NEGATIVE mg/dL   Nitrite NEGATIVE NEGATIVE   Leukocytes, UA NEGATIVE NEGATIVE   B/POS/-- (10/18 1001)  Imaging:  No results found.  MAU Course/MDM: I have ordered labs and reviewed results.  NST reviewed  Will get BPP, AFI due to decels.    Consult Dr Alysia Penna with presentation, exam findings and test results.  Treatments in MAU included Procardia which lightened cramping somewhat but did not resolve it.   [place] reviewed, did confirm cervix 2.17cm long (shortened)    AFI normal.   .    Assessment: 1. Non-reassuring fetal heart rate with late deceleration   2.     Preterm uterine irritability with cervical shortening 3.      Mild dehydration  Plan: Admit to Antenatal per Dr Alysia Penna Patient left AMA just as I was talking to Dr Alysia Penna, I called her and told her we would recommend she come back, which she did Will give Procardia q 6  hrs Will give Betamethasone Watch overnight for fetal monitoring also  Wynelle Bourgeois CNM, MSN Certified Nurse-Midwife 11/20/2015 6:00 PM

## 2015-11-21 ENCOUNTER — Inpatient Hospital Stay (HOSPITAL_COMMUNITY)
Admission: AD | Admit: 2015-11-21 | Discharge: 2015-11-21 | Disposition: A | Discharge status: Home / Self Care | Payer: Medicaid Other | Source: Ambulatory Visit | Attending: Obstetrics and Gynecology | Admitting: Obstetrics and Gynecology

## 2015-11-21 MED ORDER — TERBUTALINE SULFATE 1 MG/ML IJ SOLN
0.2500 mg | Freq: Once | INTRAMUSCULAR | Status: AC
  Administered 2015-11-21: 0.25 mg via SUBCUTANEOUS

## 2015-11-21 MED ORDER — NIFEDIPINE ER OSMOTIC RELEASE 30 MG PO TB24: 30.0000 mg | ORAL_TABLET | Freq: Two times a day (BID) | ORAL | Status: DC

## 2015-11-21 MED ORDER — HYDROXYZINE HCL 50 MG/ML IM SOLN
50.0000 mg | Freq: Four times a day (QID) | INTRAMUSCULAR | Status: DC | PRN
  Administered 2015-11-21: 50 mg via INTRAMUSCULAR
  Filled 2015-11-21 (×2): qty 1

## 2015-11-21 MED ORDER — NICOTINE 21 MG/24HR TD PT24
21.0000 mg | MEDICATED_PATCH | TRANSDERMAL | Status: DC
  Administered 2015-11-21: 21 mg via TRANSDERMAL
  Filled 2015-11-21: qty 1

## 2015-11-21 MED ORDER — TERBUTALINE SULFATE 1 MG/ML IJ SOLN
INTRAMUSCULAR | Status: AC
  Administered 2015-11-21: 0.25 mg via SUBCUTANEOUS
  Filled 2015-11-21: qty 1

## 2015-11-21 MED ORDER — NIFEDIPINE ER 30 MG PO TB24: 30.0000 mg | ORAL_TABLET | Freq: Two times a day (BID) | ORAL | 1 refills | Status: DC

## 2015-11-21 MED ORDER — HYDROXYZINE HCL 50 MG PO TABS
50.0000 mg | ORAL_TABLET | Freq: Three times a day (TID) | ORAL | Status: DC | PRN
  Filled 2015-11-21: qty 1

## 2015-11-21 MED ORDER — DIPHENHYDRAMINE HCL 50 MG/ML IJ SOLN
25.0000 mg | Freq: Once | INTRAMUSCULAR | Status: AC
  Administered 2015-11-21: 25 mg via INTRAVENOUS

## 2015-11-21 MED ORDER — DIPHENHYDRAMINE HCL 50 MG/ML IJ SOLN
INTRAMUSCULAR | Status: AC
  Administered 2015-11-21: 25 mg via INTRAVENOUS
  Filled 2015-11-21: qty 1

## 2015-11-21 NOTE — MAU Note (Signed)
Pt states she just wants her prescription to "stop her contractions." Dr. Jolayne Pantheronstant e-scribed her prescription to her pharmacy. Pt asked about betamethasone shot and was told she would have to wait for pharmacy to prepare it. Pr refused to wait and left.

## 2015-11-21 NOTE — Progress Notes (Signed)
CSW received consult for "Patient needs assistance coordinating community service extension; patient is very anxious and upset about not getting her community service in time, which is due oct 27, and she was unable to get in touch with her PO during the day when her preterm labor started; patient is concerned about becoming incarcerated because she is in the hospital and unable to get community service done or get an extension."   CSW composed a letter confirming patient's admission on 11/20/15, but cannot assist with coordinating community service extension.   CSW met with patient who states she plans to leave AMA because she cannot get her probation/[employer] officer to answer her phone or call her back.  She plans to leave here and go to [employer]'s office.  MOB states she was falsely charged with misdemeanor larceny in 2016 and had until today to complete 40 hours of community service.  She states she has completed 10.5 hours so far and needs an extension.  Patient understands that CSW cannot influence this and was appreciative of the letter. Patient presents as significantly anxious and states, "I'm scared I'm going to be locked up because of this."  CSW offered to fax the letter for her, but she does not know a fax number.  CSW discussed the importance of the safety of her and her unborn baby and encouraged her to reconsider leaving AMA.  Patient feels like she does not have a choice but to leave.  CSW validated her anxieties and attempted to do some brief relaxation exercises with patient, but she was difficult to engage in this way.  Patient also states that she has two children at home, who she states are currently being cared for by her father.   Full psychosocial assessment could not be completed at this time due to patient's eagerness to leave.  CSW informed patient that we will re-evaluate her social situation when she returns to deliver.  Patient thanked CSW and states no further needs at this time.

## 2015-11-21 NOTE — Progress Notes (Signed)
Pt now states she has to leave AMA to deal with court issues unwilling to wait on CSW to come. Req IV be removed. Philipp DeputyKim Shaw, CNM notified will be up to see pt.

## 2015-11-21 NOTE — Progress Notes (Signed)
Pt given script for her procardia. N pt that CNM would not give her a note for decreased activity due to the fact that she is leaving AMA. Pt became very angry and agitated. Proceed to cuss RN. States this is "fucking bullshit" I will not be coming back to deliver here. Reminded pt of need to come back tonight at 9pm for BMZ shot. Upon leaving pt req copy of her medical records directed her to medical records to req that.

## 2015-11-21 NOTE — Progress Notes (Signed)
SNM Koostra in room pt still states she wants to leave AMA. Aware of the need to return to MAU for 2nd BMZ shot 9pm tonight. AMA paper signed.

## 2015-11-21 NOTE — Progress Notes (Signed)
Patient is agitated and wants to leave for her community service. I have explained the risks and benefits of leaving AMA, and she has decided to leave. Upon exam she was found to be closed, thick and posterior. She denies leaking of fluid or bleeding. Preterm labor precautions reviewed and patient knows when to return to MAU.   She signed her AMA papers and was given a prescription for Procardia EXL 30 mg BID ; 60 pills with 1 refill. She will return to MAU tonight for her second dose of betamethasone and

## 2015-11-24 ENCOUNTER — Ambulatory Visit (HOSPITAL_COMMUNITY): Payer: Medicaid Other | Attending: Family

## 2015-11-28 NOTE — Telephone Encounter (Signed)
Patient has upcoming appointment in MFM 11/6 and clinic 11/7.

## 2015-12-01 ENCOUNTER — Ambulatory Visit (HOSPITAL_COMMUNITY): Payer: Medicaid Other | Attending: Family

## 2015-12-02 ENCOUNTER — Encounter: Payer: Self-pay | Admitting: Advanced Practice Midwife

## 2015-12-11 ENCOUNTER — Encounter: Payer: Self-pay | Admitting: Advanced Practice Midwife

## 2015-12-11 ENCOUNTER — Ambulatory Visit (HOSPITAL_COMMUNITY): Payer: Medicaid Other

## 2015-12-11 ENCOUNTER — Encounter (HOSPITAL_COMMUNITY): Payer: Self-pay | Admitting: Obstetrics and Gynecology

## 2015-12-25 ENCOUNTER — Ambulatory Visit (INDEPENDENT_AMBULATORY_CARE_PROVIDER_SITE_OTHER): Payer: Medicaid Other | Admitting: Advanced Practice Midwife

## 2015-12-25 ENCOUNTER — Ambulatory Visit (HOSPITAL_COMMUNITY): Admission: RE | Admit: 2015-12-25 | Payer: Medicaid Other | Source: Ambulatory Visit

## 2015-12-25 ENCOUNTER — Ambulatory Visit (INDEPENDENT_AMBULATORY_CARE_PROVIDER_SITE_OTHER): Payer: Medicaid Other | Admitting: Clinical

## 2015-12-25 ENCOUNTER — Telehealth: Payer: Self-pay | Admitting: *Deleted

## 2015-12-25 VITALS — BP 113/75 | HR 92

## 2015-12-25 DIAGNOSIS — F329 Major depressive disorder, single episode, unspecified: Secondary | ICD-10-CM | POA: Diagnosis not present

## 2015-12-25 DIAGNOSIS — M791 Myalgia: Secondary | ICD-10-CM

## 2015-12-25 DIAGNOSIS — R102 Pelvic and perineal pain: Secondary | ICD-10-CM

## 2015-12-25 DIAGNOSIS — F4323 Adjustment disorder with mixed anxiety and depressed mood: Secondary | ICD-10-CM

## 2015-12-25 DIAGNOSIS — M7918 Myalgia, other site: Secondary | ICD-10-CM

## 2015-12-25 DIAGNOSIS — O26893 Other specified pregnancy related conditions, third trimester: Secondary | ICD-10-CM

## 2015-12-25 DIAGNOSIS — O9989 Other specified diseases and conditions complicating pregnancy, childbirth and the puerperium: Secondary | ICD-10-CM

## 2015-12-25 DIAGNOSIS — G4700 Insomnia, unspecified: Secondary | ICD-10-CM

## 2015-12-25 DIAGNOSIS — O99343 Other mental disorders complicating pregnancy, third trimester: Secondary | ICD-10-CM

## 2015-12-25 DIAGNOSIS — O0990 Supervision of high risk pregnancy, unspecified, unspecified trimester: Secondary | ICD-10-CM

## 2015-12-25 DIAGNOSIS — O99891 Other specified diseases and conditions complicating pregnancy: Secondary | ICD-10-CM

## 2015-12-25 MED ORDER — ZOLPIDEM TARTRATE 10 MG PO TABS: 10.0000 mg | ORAL_TABLET | Freq: Every evening | ORAL | 0 refills | Status: DC | PRN

## 2015-12-25 MED ORDER — CYCLOBENZAPRINE HCL 10 MG PO TABS: 10.0000 mg | ORAL_TABLET | Freq: Three times a day (TID) | ORAL | 0 refills | Status: DC | PRN

## 2015-12-25 MED ORDER — NIFEDIPINE ER 30 MG PO TB24: 60.0000 mg | ORAL_TABLET | Freq: Two times a day (BID) | ORAL | 0 refills | Status: DC

## 2015-12-25 NOTE — Progress Notes (Signed)
Pt c/o of severe pain and cramping with activity, irregular contraction

## 2015-12-25 NOTE — BH Specialist Note (Signed)
Session Start time: 2:05  End Time: 2:21 Total Time:  16 minutes Type of Service: Behavioral Health - Individual/Family Interpreter: No.   Interpreter Name & Language: n/a # [facility]   SUBJECTIVE: Joyce Ward is a 30 y.o. female  Pt. was referred by Sharen CounterLisa Leftwich-Kirby, CNM for:  anxiety and depression. Pt. reports the following symptoms/concerns: Pt states that she attributes irritability to aches and pains and lack of sleep (sleeps in 2-hour stretches at a time, some napping during day); has used rain sounds with some success in helping with sleep, and nothing has helped with pain. Pt says she needs to leave early, as her father is waiting. Duration of problem:  One month Severity: moderate Previous treatment: SI/attempt in 05-2015, uncertain if any past treatment   OBJECTIVE: Mood: Anxious & Affect: Appropriate Risk of harm to self or others: No known risk of harm to self today, denies any SI today, no plan; no known risk of harm to others Assessments administered: PHQ9: 12/ GAD7: 12  LIFE CONTEXT:  Family & Social: Lives with her father  School/ Work: Unknown  Self-Care: Sleeps when tired during daytime, advocates well for herself Life changes: Current pregnancy What is important to pt/family (values): Feeling less pain   GOALS ADDRESSED:  -Alleviate symptoms of anxiety and depression  INTERVENTIONS: Motivational Interviewing and Strength-based   ASSESSMENT:  Pt currently experiencing Adjustment disorder with mixed anxious and depressed mood.  Pt may benefit from psychoeducation and brief therapeutic intervention regarding coping with symptoms of anxiety and depression.   PLAN: 1. F/U with behavioral health clinician: Two weeks 2. Behavioral Health meds: Ambien, starting today 3. Behavioral recommendations:  -Continue to advocate for self -Consider reading educational material regarding coping with symptoms of anxiety and  depression -Consider reading Postpartum Planner in preparation of upcoming birth -Consider rain sounds on routine basis, nightly for improved sleep 4. Referral: Brief Counseling/Psychotherapy and Psychoeducation 5. From scale of 1-10, how likely are you to follow plan: undetermined   [facility] Clin[facility]  Behavioral Health Clinician  Marlon PelWarmhandoff:   Warm Hand Off Completed.        Depression screen [facility] 2/9 12/25/2015 11/12/2015  Decreased Interest 3 0  Down, Depressed, Hopeless 0 0  PHQ - 2 Score 3 0  Altered sleeping 3 3  Tired, decreased energy 3 1  Change in appetite 0 0  Feeling bad or failure about yourself  0 0  Trouble concentrating 0 0  Moving slowly or fidgety/restless 3 0  Suicidal thoughts 0 0  PHQ-9 Score 12 4   GAD 7 : Generalized Anxiety Score 12/25/2015 11/12/2015  Nervous, Anxious, on Edge 3 0  Control/stop worrying 0 0  Worry too much - different things 0 0  Trouble relaxing 3 3  Restless 3 0  Easily annoyed or irritable 3 0  Afraid - awful might happen 0 0  Total GAD 7 Score 12 3

## 2015-12-25 NOTE — Telephone Encounter (Signed)
Called to notify patient she had left without her ambien prescription. Patient coming back. Informed her that it would be at the front desk. Understanding voiced.

## 2015-12-25 NOTE — Progress Notes (Signed)
   PRENATAL VISIT NOTE  Subjective:  Joyce Ward is a 30 y.o. [id number] at 536w2d being seen today for ongoing prenatal care.  She is currently monitored for the following issues for this high-risk pregnancy and has Pelvic pain in female; History of endometriosis; Irregular intermenstrual bleeding; Allergic reaction caused by a drug; Dysmenorrhea; Opiate use; Major depressive disorder, single episode, severe without psychotic features (HCC); Alcohol abuse; Supervision of high risk pregnancy, antepartum; Drug abuse during pregnancy; and Preterm labor on her problem list.  Patient reports pelvic pain with movement/walking, intermittent irregular but painful contractions.  Contractions: Irregular. Vag. Bleeding: None.  Movement: Present. Denies leaking of fluid.   The following portions of the patient's history were reviewed and updated as appropriate: allergies, current medications, past family history, past medical history, past social history, past surgical history and problem list. Problem list updated.  Objective:   Vitals:   12/25/15 1259  BP: 113/75  Pulse: 92    Fetal Status: Fetal Heart Rate (bpm): 160   Movement: Present  Presentation: Vertex  General:  Alert, oriented and cooperative. Patient is in no acute distress.  Skin: Skin is warm and dry. No rash noted.   Cardiovascular: Normal heart rate noted  Respiratory: Normal respiratory effort, no problems with respiration noted  Abdomen: Soft, gravid, appropriate for gestational age. Pain/Pressure: Present     Pelvic:  Cervical exam performed Dilation: 1 Effacement (%): 50 Station: -2  Extremities: Normal range of motion.  Edema: None  Mental Status: Normal mood and affect. Normal behavior. Normal judgment and thought content.   Assessment and Plan:  Pregnancy: [id number] at 8236w2d  1. Depression affecting pregnancy in third trimester, antepartum  - Ambulatory referral to [facility]  2. Supervision of high  risk pregnancy, antepartum --No evidence of active labor today. Labor precautions/reasons to come to hospital reviewed.  3. Pain in symphysis pubis during pregnancy --Recommend pregnancy support belt/rest/ice/heat/warm bath/Tylenol.  Pt to discontinue use of ibuprofen, which she is using for pain.   4. Pelvic pain affecting pregnancy in third trimester, antepartum --See above recommendations for pubic symphysis pain.   - cyclobenzaprine (FLEXERIL) 10 MG tablet; Take 1 tablet (10 mg total) by mouth 3 (three) times daily as needed for muscle spasms.  Dispense: 30 tablet; Refill: 0 - NIFEdipine (PROCARDIA-XL/ADALAT CC) 30 MG 24 hr tablet; Take 2 tablets (60 mg total) by mouth 2 (two) times daily.  Dispense: 60 tablet; Refill: 0  5. Insomnia, unspecified type --Pt has tried Tylenol PM, Benadryl, Vistaril, and Phenergan to help her sleep. She has excitable reaction to these medications instead of sedation.  She has not slept more than 1-2 hours in the last several days.  Ambien helped her in previous pregnancies. - zolpidem (AMBIEN) 10 MG tablet; Take 1 tablet (10 mg total) by mouth at bedtime as needed for sleep.  Dispense: 30 tablet; Refill: 0  Preterm labor symptoms and general obstetric precautions including but not limited to vaginal bleeding, contractions, leaking of fluid and fetal movement were reviewed in detail with the patient. Please refer to After Visit Summary for other counseling recommendations.  Return in about 2 weeks (around 01/08/2016).   Hurshel PartyLisa A Leftwich-Kirby, CNM

## 2015-12-29 ENCOUNTER — Telehealth: Payer: Self-pay | Admitting: *Deleted

## 2015-12-29 DIAGNOSIS — R102 Pelvic and perineal pain: Secondary | ICD-10-CM

## 2015-12-29 DIAGNOSIS — O26893 Other specified pregnancy related conditions, third trimester: Secondary | ICD-10-CM

## 2015-12-29 DIAGNOSIS — O4703 False labor before 37 completed weeks of gestation, third trimester: Secondary | ICD-10-CM

## 2015-12-29 MED ORDER — NIFEDIPINE ER 30 MG PO TB24: 60.0000 mg | ORAL_TABLET | Freq: Two times a day (BID) | ORAL | 0 refills | Status: DC

## 2015-12-29 NOTE — Telephone Encounter (Signed)
Patient called and left message on nurse line about her procardia and something other than flexeril called in. Per chart review, procardia Rx did not go through earlier. Called patient stating I was returning her call. Told patient there was an error of the procardia going to her pharmacy but I just resent the prescription. Told patient in regards to the flexeril, the message has been forwarded to her provider and we will not know anything until we hear back from her. Encouraged tylenol, rest, and flexeril in the mean time. Discouraged use of ibuprofen. Patient verbalized understanding to all & had no questions

## 2015-12-29 NOTE — Telephone Encounter (Signed)
Patient called because she had run out of her procardia and needed more. Also wanted to report that the flexeril that was prescribed last visit was not helping her pain. Stated that the provider had mentioned that if it did not work she would prescribe something else. I told her that I would refill her procardia as previously ordered, but I would need to send a message to the provider about the muscle relaxer and get back to her. Patient voiced understanding.

## 2015-12-31 NOTE — Telephone Encounter (Addendum)
Pt called and stated that no one has contacted her in regards to other options for her pressure due to the flexeril not working.   Notified Raynelle FanningJulie, and was recommended to do nonpharmacologic measures to help with pressure.  Pt stated that she did not understand why is she getting a different answer than what Misty StanleyLisa had stated.  I informed pt that because Misty StanleyLisa is out of the office so we have not heard from her and to meet her needs I had to ask another provider.  I informed pt that I would send another note to Misty StanleyLisa and if she informs me anything different I would call her back.  Pt agreed.

## 2016-01-07 ENCOUNTER — Encounter: Payer: Self-pay | Admitting: General Practice

## 2016-01-07 NOTE — Progress Notes (Unsigned)
Per Sharen CounterLisa Leftwich-Kirby, narcotic pain medication is not recommended. Patient should use flexeril or other nonpharmacologic pain relief measures.

## 2016-01-12 ENCOUNTER — Ambulatory Visit: Payer: Medicaid Other | Admitting: Obstetrics & Gynecology

## 2016-01-12 DIAGNOSIS — Z9119 Patient's noncompliance with other medical treatment and regimen: Secondary | ICD-10-CM

## 2016-01-12 DIAGNOSIS — Z91199 Patient's noncompliance with other medical treatment and regimen due to unspecified reason: Secondary | ICD-10-CM

## 2016-01-13 ENCOUNTER — Ambulatory Visit (INDEPENDENT_AMBULATORY_CARE_PROVIDER_SITE_OTHER): Payer: Medicaid Other | Admitting: Student

## 2016-01-13 ENCOUNTER — Other Ambulatory Visit (HOSPITAL_COMMUNITY)
Admission: RE | Admit: 2016-01-13 | Discharge: 2016-01-13 | Disposition: A | Discharge status: Home / Self Care | Payer: Medicaid Other | Source: Ambulatory Visit | Attending: Student | Admitting: Student

## 2016-01-13 VITALS — BP 105/61 | HR 91 | Wt 143.6 lb

## 2016-01-13 DIAGNOSIS — O26893 Other specified pregnancy related conditions, third trimester: Secondary | ICD-10-CM

## 2016-01-13 DIAGNOSIS — L309 Dermatitis, unspecified: Secondary | ICD-10-CM

## 2016-01-13 DIAGNOSIS — R102 Pelvic and perineal pain: Secondary | ICD-10-CM | POA: Diagnosis not present

## 2016-01-13 DIAGNOSIS — O0993 Supervision of high risk pregnancy, unspecified, third trimester: Secondary | ICD-10-CM

## 2016-01-13 DIAGNOSIS — Z113 Encounter for screening for infections with a predominantly sexual mode of transmission: Secondary | ICD-10-CM | POA: Diagnosis present

## 2016-01-13 LAB — OB RESULTS CONSOLE GBS: STREP GROUP B AG: NEGATIVE

## 2016-01-13 MED ORDER — CYCLOBENZAPRINE HCL 10 MG PO TABS: 10.0000 mg | ORAL_TABLET | Freq: Three times a day (TID) | ORAL | 0 refills | Status: DC | PRN

## 2016-01-13 MED ORDER — TRIAMCINOLONE ACETONIDE 0.025 % EX CREA: TOPICAL_CREAM | Freq: Three times a day (TID) | CUTANEOUS | Status: AC

## 2016-01-13 NOTE — Patient Instructions (Signed)
Round Ligament Pain Introduction The round ligament is a cord of muscle and tissue that helps to support the uterus. It can become a source of pain during pregnancy if it becomes stretched or twisted as the baby grows. The pain usually begins in the second trimester of pregnancy, and it can come and go until the baby is delivered. It is not a serious problem, and it does not cause harm to the baby. Round ligament pain is usually a short, sharp, and pinching pain, but it can also be a dull, lingering, and aching pain. The pain is felt in the lower side of the abdomen or in the groin. It usually starts deep in the groin and moves up to the outside of the hip area. Pain can occur with:  A sudden change in position.  Rolling over in bed.  Coughing or sneezing.  Physical activity. Follow these instructions at home: Watch your condition for any changes. Take these steps to help with your pain:  When the pain starts, relax. Then try:  Sitting down.  Flexing your knees up to your abdomen.  Lying on your side with one pillow under your abdomen and another pillow between your legs.  Sitting in a warm bath for 15-20 minutes or until the pain goes away.  Take over-the-counter and prescription medicines only as told by your health care provider.  Move slowly when you sit and stand.  Avoid long walks if they cause pain.  Stop or lessen your physical activities if they cause pain. Contact a health care provider if:  Your pain does not go away with treatment.  You feel pain in your back that you did not have before.  Your medicine is not helping. Get help right away if:  You develop a fever or chills.  You develop uterine contractions.  You develop vaginal bleeding.  You develop nausea or vomiting.  You develop diarrhea.  You have pain when you urinate. This information is not intended to replace advice given to you by your health care provider. Make sure you discuss any questions  you have with your health care provider. Document Released: 10/21/2007 Document Revised: 06/19/2015 Document Reviewed: 03/20/2014  2017 Elsevier  

## 2016-01-13 NOTE — Progress Notes (Signed)
Pt requests refill on Flexeril  36 wk cultures today

## 2016-01-13 NOTE — Progress Notes (Signed)
   PRENATAL VISIT NOTE  Subjective:  Joyce Ward is a 30 y.o. [id number] at 3262w0d being seen today for ongoing prenatal care.  She is currently monitored for the following issues for this high-risk pregnancy and has Pelvic pain in female; History of endometriosis; Irregular intermenstrual bleeding; Allergic reaction caused by a drug; Dysmenorrhea; Opiate use; Major depressive disorder, single episode, severe without psychotic features (HCC); Alcohol abuse; Supervision of high risk pregnancy, antepartum; Drug abuse during pregnancy; and Preterm labor on her problem list.  Patient reports occasional contractions and pelvic pain, & facial rash.  Contractions: Irregular. Vag. Bleeding: None.  Movement: Present. Denies leaking of fluid.   The following portions of the patient's history were reviewed and updated as appropriate: allergies, current medications, past family history, past medical history, past social history, past surgical history and problem list. Problem list updated.  Objective:   Vitals:   01/13/16 1532  BP: 105/61  Pulse: 91  Weight: 143 lb 9.6 oz (65.1 kg)    Fetal Status: Fetal Heart Rate (bpm): 137 Fundal Height: 37 cm Movement: Present  Presentation: Vertex  General:  Alert, oriented and cooperative. Patient is in no acute distress.  Skin: Skin is warm and dry. Papular & erythematous area on chin, ~3 cm in diameter  Cardiovascular: Normal heart rate noted  Respiratory: Normal respiratory effort, no problems with respiration noted  Abdomen: Soft, gravid, appropriate for gestational age. Pain/Pressure: Present     Pelvic:  Cervical exam performed Dilation: 1 Effacement (%): 50 Station: -3  Extremities: Normal range of motion.  Edema: Trace  Mental Status: Normal mood and affect. Normal behavior. Normal judgment and thought content.   Assessment and Plan:  Pregnancy: [id number] at 2562w0d  1. Pregnancy, supervision, high-risk, third trimester  - Culture, beta strep (group  b only) - GC/Chlamydia probe amp ([place]  2. Pelvic pain affecting pregnancy in third trimester, antepartum  - cyclobenzaprine (FLEXERIL) 10 MG tablet; Take 1 tablet (10 mg total) by mouth 3 (three) times daily as needed for muscle spasms.  Dispense: 30 tablet; Refill: 0  3. Eczema of face  - triamcinolone (KENALOG) 0.025 % cream; Apply topically 3 (three) times daily.  Term labor symptoms and general obstetric precautions including but not limited to vaginal bleeding, contractions, leaking of fluid and fetal movement were reviewed in detail with the patient. Please refer to After Visit Summary for other counseling recommendations.  Return in about 1 week (around 01/20/2016) for Routine OB.   Judeth HornErin Nancylee Gaines, NP

## 2016-01-14 LAB — GC/CHLAMYDIA PROBE AMP (~~LOC~~) NOT AT ARMC
Chlamydia: NEGATIVE
Neisseria Gonorrhea: NEGATIVE

## 2016-01-14 NOTE — Progress Notes (Signed)
Pt left before being seen by MD.   SW needs to reach out to patient and see why she is noncompliant.  NICU should be aware that she is near term and noncompliant.

## 2016-01-16 LAB — CULTURE, BETA STREP (GROUP B ONLY)

## 2016-01-20 ENCOUNTER — Inpatient Hospital Stay (HOSPITAL_COMMUNITY): Payer: Medicaid Other | Admitting: Anesthesiology

## 2016-01-20 ENCOUNTER — Encounter (HOSPITAL_COMMUNITY): Payer: Self-pay

## 2016-01-20 ENCOUNTER — Inpatient Hospital Stay (HOSPITAL_COMMUNITY)
Admission: AD | Admit: 2016-01-20 | Discharge: 2016-01-21 | DRG: 774 | Disposition: A | Discharge status: Home / Self Care | Payer: Medicaid Other | Source: Ambulatory Visit | Attending: Family Medicine | Admitting: Family Medicine

## 2016-01-20 DIAGNOSIS — F17210 Nicotine dependence, cigarettes, uncomplicated: Secondary | ICD-10-CM | POA: Diagnosis present

## 2016-01-20 DIAGNOSIS — Z3493 Encounter for supervision of normal pregnancy, unspecified, third trimester: Secondary | ICD-10-CM | POA: Diagnosis present

## 2016-01-20 DIAGNOSIS — Z3A39 39 weeks gestation of pregnancy: Secondary | ICD-10-CM

## 2016-01-20 DIAGNOSIS — O99344 Other mental disorders complicating childbirth: Secondary | ICD-10-CM | POA: Diagnosis present

## 2016-01-20 DIAGNOSIS — G8929 Other chronic pain: Secondary | ICD-10-CM | POA: Diagnosis present

## 2016-01-20 DIAGNOSIS — O26893 Other specified pregnancy related conditions, third trimester: Secondary | ICD-10-CM

## 2016-01-20 DIAGNOSIS — O163 Unspecified maternal hypertension, third trimester: Secondary | ICD-10-CM

## 2016-01-20 DIAGNOSIS — F1190 Opioid use, unspecified, uncomplicated: Secondary | ICD-10-CM | POA: Diagnosis not present

## 2016-01-20 DIAGNOSIS — F1110 Opioid abuse, uncomplicated: Secondary | ICD-10-CM | POA: Diagnosis present

## 2016-01-20 DIAGNOSIS — O99334 Smoking (tobacco) complicating childbirth: Secondary | ICD-10-CM | POA: Diagnosis present

## 2016-01-20 DIAGNOSIS — O99320 Drug use complicating pregnancy, unspecified trimester: Secondary | ICD-10-CM

## 2016-01-20 DIAGNOSIS — O0990 Supervision of high risk pregnancy, unspecified, unspecified trimester: Secondary | ICD-10-CM

## 2016-01-20 DIAGNOSIS — O99324 Drug use complicating childbirth: Secondary | ICD-10-CM | POA: Diagnosis present

## 2016-01-20 DIAGNOSIS — F1910 Other psychoactive substance abuse, uncomplicated: Secondary | ICD-10-CM

## 2016-01-20 DIAGNOSIS — F329 Major depressive disorder, single episode, unspecified: Secondary | ICD-10-CM | POA: Diagnosis present

## 2016-01-20 DIAGNOSIS — O368390 Maternal care for abnormalities of the fetal heart rate or rhythm, unspecified trimester, not applicable or unspecified: Secondary | ICD-10-CM

## 2016-01-20 DIAGNOSIS — O1002 Pre-existing essential hypertension complicating childbirth: Secondary | ICD-10-CM | POA: Diagnosis present

## 2016-01-20 DIAGNOSIS — O99323 Drug use complicating pregnancy, third trimester: Secondary | ICD-10-CM

## 2016-01-20 DIAGNOSIS — R102 Pelvic and perineal pain: Secondary | ICD-10-CM

## 2016-01-20 HISTORY — DX: Unspecified maternal hypertension, third trimester: O16.3

## 2016-01-20 LAB — CBC
HEMATOCRIT: 32.7 % — AB (ref 36.0–46.0)
HEMOGLOBIN: 11.3 g/dL — AB (ref 12.0–15.0)
MCH: 31.7 pg (ref 26.0–34.0)
MCHC: 34.6 g/dL (ref 30.0–36.0)
MCV: 91.6 fL (ref 78.0–100.0)
Platelets: 413 10*3/uL — ABNORMAL HIGH (ref 150–400)
RBC: 3.57 MIL/uL — ABNORMAL LOW (ref 3.87–5.11)
RDW: 13.7 % (ref 11.5–15.5)
WBC: 18.6 10*3/uL — AB (ref 4.0–10.5)

## 2016-01-20 LAB — COMPREHENSIVE METABOLIC PANEL
ALBUMIN: 3.2 g/dL — AB (ref 3.5–5.0)
ALT: 7 U/L — ABNORMAL LOW (ref 14–54)
ANION GAP: 6 (ref 5–15)
AST: 13 U/L — AB (ref 15–41)
Alkaline Phosphatase: 189 U/L — ABNORMAL HIGH (ref 38–126)
BILIRUBIN TOTAL: 0.3 mg/dL (ref 0.3–1.2)
BUN: 9 mg/dL (ref 6–20)
CHLORIDE: 107 mmol/L (ref 101–111)
CO2: 22 mmol/L (ref 22–32)
Calcium: 9.1 mg/dL (ref 8.9–10.3)
Creatinine, Ser: 0.48 mg/dL (ref 0.44–1.00)
GFR calc Af Amer: >60 mL/min (ref >60)
Glucose, Bld: 97 mg/dL (ref 65–99)
POTASSIUM: 4 mmol/L (ref 3.5–5.1)
Sodium: 135 mmol/L (ref 135–145)
TOTAL PROTEIN: 6.4 g/dL — AB (ref 6.5–8.1)

## 2016-01-20 LAB — TYPE AND SCREEN
ABO/RH(D): B POS
ANTIBODY SCREEN: NEGATIVE

## 2016-01-20 MED ORDER — ACETAMINOPHEN 325 MG PO TABS
650.0000 mg | ORAL_TABLET | ORAL | Status: DC | PRN
  Administered 2016-01-21: 650 mg via ORAL
  Filled 2016-01-20: qty 2

## 2016-01-20 MED ORDER — OXYTOCIN 40 UNITS IN LACTATED RINGERS INFUSION - SIMPLE MED: 2.5000 [IU]/h | INTRAVENOUS | Status: DC

## 2016-01-20 MED ORDER — LACTATED RINGERS IV SOLN: 500.0000 mL | INTRAVENOUS | Status: DC | PRN

## 2016-01-20 MED ORDER — DIPHENHYDRAMINE HCL 50 MG/ML IJ SOLN: 12.5000 mg | INTRAMUSCULAR | Status: DC | PRN

## 2016-01-20 MED ORDER — SOD CITRATE-CITRIC ACID 500-334 MG/5ML PO SOLN
30.0000 mL | ORAL | Status: DC | PRN
  Administered 2016-01-20: 30 mL via ORAL
  Filled 2016-01-20: qty 15

## 2016-01-20 MED ORDER — PHENYLEPHRINE 40 MCG/ML (10ML) SYRINGE FOR IV PUSH (FOR BLOOD PRESSURE SUPPORT)
80.0000 ug | PREFILLED_SYRINGE | INTRAVENOUS | Status: DC | PRN
  Filled 2016-01-20: qty 5
  Filled 2016-01-20: qty 10

## 2016-01-20 MED ORDER — EPHEDRINE 5 MG/ML INJ
10.0000 mg | INTRAVENOUS | Status: DC | PRN
  Filled 2016-01-20: qty 4

## 2016-01-20 MED ORDER — EPHEDRINE 5 MG/ML INJ
10.0000 mg | INTRAVENOUS | Status: DC | PRN
Start: 2016-01-20 — End: 2016-01-21
  Filled 2016-01-20: qty 4

## 2016-01-20 MED ORDER — FENTANYL CITRATE (PF) 100 MCG/2ML IJ SOLN: 50.0000 ug | Freq: Once | INTRAMUSCULAR | Status: DC

## 2016-01-20 MED ORDER — LIDOCAINE HCL (PF) 1 % IJ SOLN
INTRAMUSCULAR | Status: DC | PRN
  Administered 2016-01-20 (×2): 4 mL via EPIDURAL

## 2016-01-20 MED ORDER — LIDOCAINE HCL (PF) 1 % IJ SOLN
30.0000 mL | INTRAMUSCULAR | Status: DC | PRN
  Filled 2016-01-20: qty 30

## 2016-01-20 MED ORDER — LACTATED RINGERS IV SOLN
500.0000 mL | Freq: Once | INTRAVENOUS | Status: AC
  Administered 2016-01-21: 400 mL via INTRAVENOUS

## 2016-01-20 MED ORDER — OXYCODONE-ACETAMINOPHEN 5-325 MG PO TABS: 2.0000 | ORAL_TABLET | ORAL | Status: DC | PRN

## 2016-01-20 MED ORDER — LACTATED RINGERS IV SOLN
INTRAVENOUS | Status: DC
  Administered 2016-01-20 – 2016-01-21 (×2): via INTRAVENOUS

## 2016-01-20 MED ORDER — FENTANYL 2.5 MCG/ML BUPIVACAINE 1/10 % EPIDURAL INFUSION (WH - ANES)
14.0000 mL/h | INTRAMUSCULAR | Status: DC | PRN
  Administered 2016-01-20 – 2016-01-21 (×4): 14 mL/h via EPIDURAL
  Filled 2016-01-20 (×3): qty 100

## 2016-01-20 MED ORDER — FLEET ENEMA 7-19 GM/118ML RE ENEM: 1.0000 | ENEMA | RECTAL | Status: DC | PRN

## 2016-01-20 MED ORDER — OXYCODONE-ACETAMINOPHEN 5-325 MG PO TABS: 1.0000 | ORAL_TABLET | ORAL | Status: DC | PRN

## 2016-01-20 MED ORDER — ONDANSETRON HCL 4 MG/2ML IJ SOLN: 4.0000 mg | Freq: Four times a day (QID) | INTRAMUSCULAR | Status: DC | PRN

## 2016-01-20 MED ORDER — OXYTOCIN BOLUS FROM INFUSION
500.0000 mL | Freq: Once | INTRAVENOUS | Status: AC
  Administered 2016-01-21: 500 mL via INTRAVENOUS

## 2016-01-20 NOTE — MAU Note (Signed)
Pt presents complaining of contractions. Denies leaking or bleeding.

## 2016-01-20 NOTE — Anesthesia Preprocedure Evaluation (Signed)
Anesthesia Evaluation  Patient identified by MRN, date of birth, ID band Patient awake    Reviewed: Allergy & Precautions, Patient's Chart, lab work & pertinent test results  Airway Mallampati: III       Dental no notable dental hx. (+) Teeth Intact   Pulmonary Current Smoker,    Pulmonary exam normal breath sounds clear to auscultation       Cardiovascular hypertension, Normal cardiovascular exam Rhythm:Regular Rate:Normal     Neuro/Psych PSYCHIATRIC DISORDERS Depression negative neurological ROS  negative psych ROS   GI/Hepatic GERD  Medicated and Controlled,(+)     substance abuse  alcohol use and marijuana use, Hx/o prescription drug abuse   Endo/Other  negative endocrine ROS  Renal/GU negative Renal ROS  negative genitourinary   Musculoskeletal   Abdominal (+) - obese,   Peds  Hematology  (+) anemia ,   Anesthesia Other Findings   Reproductive/Obstetrics Hx/o endometriosis and pelvic pain                             Anesthesia Physical Anesthesia Plan  ASA: II  Anesthesia Plan: Epidural   Post-op Pain Management:    Induction:   Airway Management Planned: Natural Airway  Additional Equipment:   Intra-op Plan:   Post-operative Plan:   Informed Consent: I have reviewed the patients History and Physical, chart, labs and discussed the procedure including the risks, benefits and alternatives for the proposed anesthesia with the patient or authorized representative who has indicated his/her understanding and acceptance.     Plan Discussed with: Anesthesiologist  Anesthesia Plan Comments:         Anesthesia Quick Evaluation

## 2016-01-20 NOTE — Anesthesia Procedure Notes (Signed)
Epidural Patient location during procedure: OB Start time: 01/20/2016 10:53 PM  Staffing Anesthesiologist: Mal AmabileFOSTER, Kalene Cutler Performed: anesthesiologist   Preanesthetic Checklist Completed: patient identified, site marked, surgical consent, pre-op evaluation, timeout performed, IV checked, risks and benefits discussed and monitors and equipment checked  Epidural Patient position: sitting Prep: site prepped and draped and DuraPrep Patient monitoring: continuous pulse ox and blood pressure Approach: midline Location: L3-L4 Injection technique: LOR air  Needle:  Needle type: Tuohy  Needle gauge: 17 G Needle length: 9 cm and 9 Needle insertion depth: 5 cm cm Catheter type: closed end flexible Catheter size: 19 Gauge Catheter at skin depth: 10 cm Test dose: negative and Other  Assessment Events: blood not aspirated, injection not painful, no injection resistance, negative IV test and no paresthesia  Additional Notes Patient identified. Risks and benefits discussed including failed block, incomplete  Pain control, post dural puncture headache, nerve damage, paralysis, blood pressure Changes, nausea, vomiting, reactions to medications-both toxic and allergic and post Partum back pain. All questions were answered. Patient expressed understanding and wished to proceed. Sterile technique was used throughout procedure. Epidural site was Dressed with sterile barrier dressing. No paresthesias, signs of intravascular injection Or signs of intrathecal spread were encountered.  Patient was more comfortable after the epidural was dosed. Please see RN's note for documentation of vital signs and FHR which are stable.

## 2016-01-20 NOTE — Progress Notes (Signed)
Notified of pt recheck with some cervical change and pt being very uncomfortable with non reactive NST. Will discuss pt with CNM

## 2016-01-20 NOTE — Progress Notes (Signed)
Notified of pt arrival in MAU and exam with intermittent FHR tracing and variable decels. Will attempt to get more continuous FHR tracing and will give pain medication if reactive.

## 2016-01-21 ENCOUNTER — Encounter: Payer: Self-pay | Admitting: Obstetrics and Gynecology

## 2016-01-21 ENCOUNTER — Encounter (HOSPITAL_COMMUNITY): Payer: Self-pay | Admitting: *Deleted

## 2016-01-21 DIAGNOSIS — O99324 Drug use complicating childbirth: Secondary | ICD-10-CM

## 2016-01-21 DIAGNOSIS — Z3A39 39 weeks gestation of pregnancy: Secondary | ICD-10-CM

## 2016-01-21 DIAGNOSIS — F1190 Opioid use, unspecified, uncomplicated: Secondary | ICD-10-CM

## 2016-01-21 LAB — URINALYSIS, ROUTINE W REFLEX MICROSCOPIC
BILIRUBIN URINE: NEGATIVE
Glucose, UA: NEGATIVE mg/dL
HGB URINE DIPSTICK: NEGATIVE
KETONES UR: NEGATIVE mg/dL
NITRITE: NEGATIVE
PH: 6 (ref 5.0–8.0)
Protein, ur: NEGATIVE mg/dL
SPECIFIC GRAVITY, URINE: 1.006 (ref 1.005–1.030)

## 2016-01-21 LAB — RAPID URINE DRUG SCREEN, HOSP PERFORMED
Amphetamines: NOT DETECTED
BARBITURATES: NOT DETECTED
Benzodiazepines: NOT DETECTED
Cocaine: NOT DETECTED
Opiates: POSITIVE — AB
TETRAHYDROCANNABINOL: NOT DETECTED

## 2016-01-21 LAB — RPR: RPR Ser Ql: NONREACTIVE

## 2016-01-21 LAB — PROTEIN / CREATININE RATIO, URINE: Creatinine, Urine: 21 mg/dL

## 2016-01-21 MED ORDER — OXYCODONE-ACETAMINOPHEN 5-325 MG PO TABS: 1.0000 | ORAL_TABLET | Freq: Four times a day (QID) | ORAL | 0 refills | Status: DC | PRN

## 2016-01-21 MED ORDER — COCONUT OIL OIL: 1.0000 "application " | TOPICAL_OIL | Status: DC | PRN

## 2016-01-21 MED ORDER — BENZOCAINE-MENTHOL 20-0.5 % EX AERO: 1.0000 "application " | INHALATION_SPRAY | CUTANEOUS | Status: DC | PRN

## 2016-01-21 MED ORDER — WITCH HAZEL-GLYCERIN EX PADS: 1.0000 "application " | MEDICATED_PAD | CUTANEOUS | Status: DC | PRN

## 2016-01-21 MED ORDER — ZOLPIDEM TARTRATE 5 MG PO TABS: 5.0000 mg | ORAL_TABLET | Freq: Every evening | ORAL | Status: DC | PRN

## 2016-01-21 MED ORDER — OXYCODONE-ACETAMINOPHEN 5-325 MG PO TABS
2.0000 | ORAL_TABLET | Freq: Four times a day (QID) | ORAL | Status: DC | PRN
  Administered 2016-01-21: 2 via ORAL
  Filled 2016-01-21: qty 2

## 2016-01-21 MED ORDER — TERBUTALINE SULFATE 1 MG/ML IJ SOLN: 0.2500 mg | Freq: Once | INTRAMUSCULAR | Status: DC | PRN

## 2016-01-21 MED ORDER — IBUPROFEN 600 MG PO TABS: 600.0000 mg | ORAL_TABLET | Freq: Four times a day (QID) | ORAL | 0 refills | Status: AC

## 2016-01-21 MED ORDER — ONDANSETRON HCL 4 MG PO TABS: 4.0000 mg | ORAL_TABLET | ORAL | Status: DC | PRN

## 2016-01-21 MED ORDER — DIBUCAINE 1 % RE OINT: 1.0000 "application " | TOPICAL_OINTMENT | RECTAL | Status: DC | PRN

## 2016-01-21 MED ORDER — ONDANSETRON HCL 4 MG/2ML IJ SOLN: 4.0000 mg | INTRAMUSCULAR | Status: DC | PRN

## 2016-01-21 MED ORDER — TETANUS-DIPHTH-ACELL PERTUSSIS 5-2.5-18.5 LF-MCG/0.5 IM SUSP: 0.5000 mL | Freq: Once | INTRAMUSCULAR | Status: DC

## 2016-01-21 MED ORDER — SIMETHICONE 80 MG PO CHEW: 80.0000 mg | CHEWABLE_TABLET | ORAL | Status: DC | PRN

## 2016-01-21 MED ORDER — SENNOSIDES-DOCUSATE SODIUM 8.6-50 MG PO TABS: 2.0000 | ORAL_TABLET | ORAL | Status: DC

## 2016-01-21 MED ORDER — DIPHENHYDRAMINE HCL 25 MG PO CAPS: 25.0000 mg | ORAL_CAPSULE | Freq: Four times a day (QID) | ORAL | Status: DC | PRN

## 2016-01-21 MED ORDER — IBUPROFEN 600 MG PO TABS
600.0000 mg | ORAL_TABLET | Freq: Four times a day (QID) | ORAL | Status: DC
  Administered 2016-01-21: 600 mg via ORAL
  Filled 2016-01-21: qty 1

## 2016-01-21 MED ORDER — ACETAMINOPHEN 325 MG PO TABS: 650.0000 mg | ORAL_TABLET | ORAL | Status: DC | PRN

## 2016-01-21 MED ORDER — PRENATAL MULTIVITAMIN CH: 1.0000 | ORAL_TABLET | Freq: Every day | ORAL | Status: DC

## 2016-01-21 MED ORDER — CYCLOBENZAPRINE HCL 10 MG PO TABS: 10.0000 mg | ORAL_TABLET | Freq: Three times a day (TID) | ORAL | 0 refills | Status: DC | PRN

## 2016-01-21 MED ORDER — OXYTOCIN 40 UNITS IN LACTATED RINGERS INFUSION - SIMPLE MED
1.0000 m[IU]/min | INTRAVENOUS | Status: DC
  Administered 2016-01-21: 2 m[IU]/min via INTRAVENOUS
  Filled 2016-01-21: qty 1000

## 2016-01-21 NOTE — Progress Notes (Signed)
CSW is familiar with patient from an admission in October 2017.  CSW was not aware of patient's plan for adoption.  CSW met with patient to offer support.  Patient remembers CSW and told her father that CSW was "nice" to her.  She was in the midst of signing relinquishment papers with [facility]/Phyllis Stephenson.  Patient was extremely tearful.  CSW, Ms. Stephenson and patient's father offered support.  Patient reports no need for CSW intervention and thanked CSW for her time and concern.  CSW ensured that adoption agency will arrange for mental health follow up/support.  CSW asked patient to call CSW if she needs anything or would like to talk with CSW.  She was eager to finish signing papers and go outside.  CSW asked adoption agency representative to contact CSW when relinquishment papers have been completed.  CSW will then notify House Coverage RN to get a room for adoptive parents who are already here with baby in the nursery.   

## 2016-01-21 NOTE — Anesthesia Pain Management Evaluation Note (Signed)
  [facility] Pain Management Visit Note  Patient: Joyce Ward, 30 y.o., female  "Hello I am a member of the anesthesia team at [facility]. We have an anesthesia team available at all times to provide care throughout the hospital, including epidural management and anesthesia for C-section. I don't know your plan for the delivery whether it a natural birth, water birth, IV sedation, nitrous supplementation, doula or epidural, but we want to meet your pain goals."   1.Was your pain managed to your expectations on prior hospitalizations?   Yes   2.What is your expectation for pain management during this hospitalization?     Epidural  3.How can we help you reach that goal? Epidural when desired.  Record the patient's initial score and the patient's pain goal.   Pain: 2  Pain Goal: 4 [facility] wants you to be able to say your pain was always managed very well.  Issacc Merlo 01/21/2016

## 2016-01-21 NOTE — Progress Notes (Signed)
Pt off unit via wheelchair, accompanied by her father.

## 2016-01-21 NOTE — Progress Notes (Signed)
Pt informed [name], CNM will be into speak to her regarding an early discharge.  Pt informed CNM prefers she remain at hospital for 6-8 hours post delivery as she was previously informed.  Pt states she's agreeable to stay @ hospital for 6 hours and it's not necessary for CNM to see her regarding early discharge.

## 2016-01-21 NOTE — Progress Notes (Signed)
Wallace CullensL. Leftwhich-Kirby, CNM notified regarding pt's request to be discharged now.  CNM states pt was previously informed she would be discharged 6-8 hours post delivery.  CNM states she will come see pt to reiterate need to remain in hospital @ least 6-8 hours secondary pt safety.

## 2016-01-21 NOTE — Progress Notes (Addendum)
Patient ID: Joyce Ward, female   DOB: 01/13/1986, 30 y.o.   MRN: [id number] Pt planning adoption w/ Joyce BucyPhyllis Ward Executive Director at [employer] [facility]. Cell: [phone]. Request that adoptive mother get bracelet and cut cord.

## 2016-01-21 NOTE — Progress Notes (Signed)
Patient ID: Joyce Ward, female   DOB: 03/18/1985, 30 y.o.   MRN: [id number] Joyce Ward is a 30 y.o. [id number] at 6464w1d.  Subjective: Mod pressure w/ UC  Objective: BP 96/73   Pulse 81   Temp 97.9 F (36.6 C) (Oral)   Resp 18   Ht 5\' 3"  (1.6 m)   Wt 143 lb (64.9 kg)   LMP 05/12/2015   SpO2 100%   BMI 25.33 kg/m    FHT:  FHR: 130 bpm, variability: mod,  accelerations:  15x15,  decelerations:  None UC:   Q 2-4 minutes, moderate Dilation: 5.5 Effacement (%): 70 Cervical Position: Middle Station: 0 Presentation: Vertex Exam by:: Dorathy KinsmanVirginia Diar Berkel, CNM  AROM moderate amount of clear fluid  Labs: NA  Assessment / Plan: 6164w1d week IUP Labor: Latent Fetal Wellbeing:  Category I Pain Control:  Epidural Anticipated MOD:  SVD  [place] Catarina Huntley, CNM 01/21/2016 9:05 AM

## 2016-01-21 NOTE — Discharge Summary (Signed)
OB Discharge Summary     Patient Name: Joyce Ward DOB: 02/17/1985 MRN: [name]  Date of admission: 01/20/2016 Delivering MD: Freddrick MarchAMIN, YASHIKA   Date of discharge: 01/21/2016  Admitting diagnosis: 40w possible labor Intrauterine pregnancy: 9431w1d     Secondary diagnosis:  Active Problems:   Hypertension affecting pregnancy, third trimester  Additional problems: Opiate use, habituated                                   Chronic pelvic pain                                   HX drug abuse during pregnancy,                                   Hx alcohol abuse during pregnancy                                   Baby for adoption     Discharge diagnosis: Term Pregnancy Delivered and baby for adoption                                                Opiate use , habituated                                Chronic pelvic pain                                Hx pancreatitis                               Post partum procedures:  Augmentation: Pitocin  Complications: None  Hospital course:  Onset of Labor With Vaginal Delivery     HPI: Joyce QuinceOnnalee Ward is a 30 y.o. year old 725P2022 female at 273w0d weeks gestation who presents to MAU reporting Labor. Pt was having frequent painful contractions, but made minimal cervical change in MAU from 3.5-4 cm. Had 1 minute variable and took a long time for tracing to become reactive afterward. Also had elevated BP 145/81. No Hx HTN.  Denies HA, vision changes or epigastric pain.  Plans to put baby up for adoption.       Patient Active Problem List   Diagnosis Date Noted  . Hypertension affecting pregnancy, third trimester 01/20/2016  . Drug abuse during pregnancy 11/17/2015  . Supervision of high risk pregnancy, antepartum 11/12/2015  . Alcohol abuse 06/01/2015  . Major depressive disorder, single episode, severe without psychotic features (HCC) 05/31/2015  . Opiate use 12/19/2014  . Dysmenorrhea 12/09/2014  . Allergic reaction caused by a drug  10/02/2014  . Pelvic pain in female 08/19/2014  . History of endometriosis 08/19/2014  . Irregular intermenstrual bleeding 08/19/2014   Fetal Exam Fetal Monitor Review: Mode: ultrasound.   Baseline rate: 150.  Variability: minimal (<5 bpm).   Pattern: accelerations present and variable decelerations.  Fetal State Assessment: Category II - tracings are indeterminate.    Physical Exam  Nursing note and vitals reviewed. Constitutional: She is oriented to person, place, and time. She appears well-developed and well-nourished. She appears distressed (Severely).  HENT:  Head: Atraumatic.  Eyes: Conjunctivae are normal. No scleral icterus.  Cardiovascular: Normal rate, regular rhythm and normal heart sounds.   Respiratory: Effort normal and breath sounds normal. No respiratory distress.  GI: Soft. There is no tenderness.  Genitourinary: Vagina normal.  Musculoskeletal: She exhibits no tenderness.  Neurological: She is alert and oriented to person, place, and time. She has normal reflexes.  Skin: Skin is warm and dry.  Psychiatric: Her mood appears anxious. Her affect is labile.    Prenatal labs: ABO, Rh: --/--/B POS (12/26 2105) Antibody: NEG (12/26 2105) Rubella: 1.06 (10/18 1001) RPR: NON REAC (10/18 1001)  HBsAg: NEGATIVE (10/18 1001)  HIV: NONREACTIVE (10/18 1001)  GBS: Negative (12/19 0000)   Assessment: 1. Labor: Latent 2. Fetal Wellbeing: Category I-II  3. Pain Control: Planning epidural 4. GBS: Neg 5. 39.0 week IUP 6. Hx Opiate use  7. Hx depression and suicide attempt by overdose first trimester 8. HTN pregnancy w/ out evidence qof Pre-E 9. FHR decel, tracing improved, reactive 10. [facility] [place] [phone]  Plan:  1. Admit to BS per consult with MD 2. Routine L&D orders 3. Analgesia/anesthesia PRN  4. Augment w/ pitocin PRN 5. SW consult PP 6. Requesting to have adoptive mother at [facility] for delivery and to cut the cord and than have  baby taken to nursery  7. UDS  Dorathy Kinsman 01/21/2016, 5:14 AM     29 y.o. yo [id number] at [redacted]w[redacted]d was admitted in Latent Labor on 01/20/2016. Patient had an uncomplicated labor course as follows:  Membrane Rupture Time/Date: 7:44 AM ,01/21/2016   Intrapartum Procedures: Episiotomy: None [1]                                         Lacerations:  1st degree [2];Periurethral [8]  Patient had a delivery of a Viable infant. 01/21/2016 by noon Information for the patient's newborn:  Devanshi, [place] Girl Elane [[id number]]  Delivery Method: Vaginal, Spontaneous Delivery (Filed from Delivery Summary)    Pateint had an uncomplicated postpartum course, she insisted on early discharge, where she will be under the support of her overinvolved and enabling father. .  She is ambulating, tolerating a regular diet, passing flatus, and complaining dramatically of the pain of flatus and gas passage., and urinating well. Patient is discharged home in stable condition on 12/27/17at 6 pm.     Physical exam Vitals:   01/21/16 1326 01/21/16 1350 01/21/16 1450 01/21/16 1848  BP: 101/63 (!) 108/59 115/63 123/78  Pulse: 88 92 88 (!) 111  Resp: 18 20 20 17   Temp: 98 F (36.7 C) 98.3 F (36.8 C) 98 F (36.7 C)   TempSrc: Oral Oral Oral   SpO2:  100% 100% 97%  Weight:      Height:       General: alert and cooperative Lochia: appropriate Uterine Fundus: firm Incision: DVT Evaluation: No evidence of DVT seen on physical exam. Labs: Lab Results  Component Value Date   WBC 18.6 (H) 01/20/2016   HGB 11.3 (L) 01/20/2016   HCT 32.7 (L) 01/20/2016   MCV 91.6 01/20/2016   PLT 413 (H) 01/20/2016   CMP Latest Ref Rng &  Units 01/20/2016  Glucose 65 - 99 mg/dL 97  BUN 6 - 20 mg/dL 9  Creatinine 8.290.44 - 5.621.00 mg/dL 1.300.48  Sodium 865135 - 784145 mmol/L 135  Potassium 3.5 - 5.1 mmol/L 4.0  Chloride 101 - 111 mmol/L 107  CO2 22 - 32 mmol/L 22  Calcium 8.9 - 10.3 mg/dL 9.1  Total Protein 6.5 - 8.1 g/dL 6.4(L)  Total  Bilirubin 0.3 - 1.2 mg/dL 0.3  Alkaline Phos 38 - 126 U/L 189(H)  AST 15 - 41 U/L 13(L)  ALT 14 - 54 U/L 7(L)    Discharge instruction: per After Visit Summary and "Baby and Me Booklet".  After visit meds:  Allergies as of 01/21/2016      Reactions   Codeine Hives   Able to take percocet   Latex Hives   Naproxen Hives   Toradol [ketorolac Tromethamine] Hives   Tramadol Hives      Medication List    STOP taking these medications   NIFEdipine 30 MG 24 hr tablet Commonly known as:  PROCARDIA-XL/ADALAT CC   zolpidem 10 MG tablet Commonly known as:  AMBIEN     TAKE these medications   cyclobenzaprine 10 MG tablet Commonly known as:  FLEXERIL Take 1 tablet (10 mg total) by mouth 3 (three) times daily as needed for muscle spasms.   ibuprofen 600 MG tablet Commonly known as:  ADVIL,MOTRIN Take 1 tablet (600 mg total) by mouth every 6 (six) hours. Start taking on:  01/22/2016   oxyCODONE-acetaminophen 5-325 MG tablet Commonly known as:  PERCOCET/ROXICET Take 1 tablet by mouth every 6 (six) hours as needed for severe pain.       Diet: routine diet  Activity: Advance as tolerated. Pelvic rest for 6 weeks.   Outpatient follow up:1 week , will also be seen by Adoptive agency at her home Follow up Appt:Future Appointments Date Time Provider Department Center  03/02/2016 1:20 PM Aviva SignsMarie L Williams, CNM [facility]   Follow up Visit:No Follow-up on file.  Postpartum contraception: Abstinence  Newborn Data: Live born female  Birth Weight: 6 lb 14.9 oz (3145 g) APGAR: 9, 9  Baby Feeding: bottle by adoptive parents Disposition:rooming in   01/21/2016 Tilda BurrowFERGUSON,Clint Strupp V, MD

## 2016-01-21 NOTE — H&P (Signed)
HPI: Joyce Ward is a 30 y.o. year old 815P2022 female at 1330w0d weeks gestation who presents to [facility]U reporting Labor. Pt was having frequent painful contractions, but made minimal cervical change in MAU from 3.5-4 cm. Had 1 minute variable and took a long time for tracing to become reactive afterward. Also had elevated BP 145/81. No Hx HTN.  Denies HA, vision changes or epigastric pain.  Plans to put baby up for adoption.   Patient Active Problem List   Diagnosis Date Noted  . Hypertension affecting pregnancy, third trimester 01/20/2016  . Drug abuse during pregnancy 11/17/2015  . Supervision of high risk pregnancy, antepartum 11/12/2015  . Alcohol abuse 06/01/2015  . Major depressive disorder, single episode, severe without psychotic features (HCC) 05/31/2015  . Opiate use 12/19/2014  . Dysmenorrhea 12/09/2014  . Allergic reaction caused by a drug 10/02/2014  . Pelvic pain in female 08/19/2014  . History of endometriosis 08/19/2014  . Irregular intermenstrual bleeding 08/19/2014   [facility]  Prenatal Labs  Dating  6 wk ultrasound Blood type: B/POS/-- (10/18 1001)   Genetic Screen 1 Screen:    AFP:     Quad:     NIPS: Antibody:NEG (10/18 1001)  Anatomic US Nml Rubella: 1.06 (10/18 1001)  GTT Early:               Third trimester: 122 RPR: NON REAC (10/18 1001)   Flu vaccine   declines HBsAg: NEGATIVE (10/18 1001)   TDaP vaccine    Declines                                 Rhogam:N/A HIV: NONREACTIVE (10/18 1001)   Baby Food  Bottle                                            GBS:  negative  Contraception  Desires hysterectomy. Plans BTL, but no consent--interval BTL Pap: Negative Oct 2017  Circumcision  n/a female   Pediatrician  Given list 11/12/15   Support Person  Iantha FallenKenneth (dad to patient)     OB History    Gravida Para Term Preterm AB Living   5 2 2   2 2    SAB TAB Ectopic Multiple Live Births   2             Past Medical History:  Diagnosis Date  . Bronchitis   .  Depression   . Endometriosis   . GERD (gastroesophageal reflux disease)   . History of endometriosis 08/19/2014  . History of prescription drug abuse (HCC) 2016  . Irregular intermenstrual bleeding 08/19/2014  . Pelvic pain in female 08/19/2014  . UTI (urinary tract infection)    Past Surgical History:  Procedure Laterality Date  . LAPAROSCOPY     Family History: family history includes Endometriosis in her mother. Social History:  reports that she has been smoking Cigarettes.  She has a 5.00 pack-year smoking history. She has never used smokeless tobacco. She reports that she drinks alcohol. She reports that she uses drugs, including Marijuana.     Maternal Diabetes: No Genetic Screening: Normal Maternal Ultrasounds/Referrals: Normal Fetal Ultrasounds or other Referrals:  None Maternal Substance Abuse:  Yes:  Type: Prescription drugs Significant Maternal Medications:  Meds include: Other: Percocet, Flexeril Significant Maternal Lab Results:  Lab values include: Group B Strep negative Other Comments:  None  Review of Systems  Constitutional: Negative for chills and fever.  Eyes: Negative for blurred vision and double vision.  Gastrointestinal: Positive for abdominal pain. Negative for nausea and vomiting.  Neurological: Negative for headaches.   Maternal Medical History:  Reason for admission: Contractions.  Nausea.  Contractions: Frequency: regular.   Perceived severity is strong.    Fetal activity: Perceived fetal activity is normal.    Prenatal complications: Substance abuse.     Dilation: 3.5 cm Effacement (%): 70 Station: 0 Exam by:: Dorathy Kinsman, CNM Patient Vitals for the past 24 hrs:  BP Temp Temp src Pulse Resp SpO2 Height Weight  01/21/16 0430 (!) 99/54 - - 75 20 - - -  01/21/16 0400 (!) 95/59 - - 75 20 - - -  01/21/16 0330 (!) 90/52 - - 81 20 - - -  01/21/16 0300 (!) 97/54 - - 79 20 - - -  01/21/16 0100 107/71 97.7 F (36.5 C) Oral 85 20 - - -   01/20/16 2259 - - - - 20 100 % - -  01/20/16 2255 108/76 - - 95 - 100 % - -  01/20/16 2230 109/76 - - (!) 119 20 - - -  01/20/16 2209 106/70 97.8 F (36.6 C) Oral 97 20 - - -  01/20/16 2203 - - - - - - 5\' 3"  (1.6 m) 143 lb (64.9 kg)  01/20/16 1940 136/84 - - 114 - - - -  01/20/16 1925 145/81 97.9 F (36.6 C) Oral 106 18 - - -    Blood pressure (!) 99/54, pulse 75, temperature 97.7 F (36.5 C), temperature source Oral, resp. rate 20, height 5\' 3"  (1.6 m), weight 143 lb (64.9 kg), last menstrual period 05/12/2015, SpO2 100 %. Maternal Exam:  Uterine Assessment: Contraction strength is firm.  Contraction frequency is regular.   Abdomen: Patient reports no abdominal tenderness. Estimated fetal weight is 7 lb.   Fetal presentation: vertex  Introitus: Normal vulva. Normal vagina.  Pelvis: adequate for delivery.   Cervix: Cervix evaluated by digital exam.     Fetal Exam Fetal Monitor Review: Mode: ultrasound.   Baseline rate: 150.  Variability: minimal (<5 bpm).   Pattern: accelerations present and variable decelerations.    Fetal State Assessment: Category II - tracings are indeterminate.     Physical Exam  Nursing note and vitals reviewed. Constitutional: She is oriented to person, place, and time. She appears well-developed and well-nourished. She appears distressed (Severely).  HENT:  Head: Atraumatic.  Eyes: Conjunctivae are normal. No scleral icterus.  Cardiovascular: Normal rate, regular rhythm and normal heart sounds.   Respiratory: Effort normal and breath sounds normal. No respiratory distress.  GI: Soft. There is no tenderness.  Genitourinary: Vagina normal.  Musculoskeletal: She exhibits no tenderness.  Neurological: She is alert and oriented to person, place, and time. She has normal reflexes.  Skin: Skin is warm and dry.  Psychiatric: Her mood appears anxious. Her affect is labile.    Prenatal labs: ABO, Rh: --/--/B POS (12/26 2105) Antibody: NEG (12/26  2105) Rubella: 1.06 (10/18 1001) RPR: NON REAC (10/18 1001)  HBsAg: NEGATIVE (10/18 1001)  HIV: NONREACTIVE (10/18 1001)  GBS: Negative (12/19 0000)   Assessment: 1. Labor: Latent 2. Fetal Wellbeing: Category I-II  3. Pain Control: Planning epidural 4. GBS: Neg 5. 39.0 week IUP 6. Hx Opiate use  7. Hx depression and suicide attempt by overdose first trimester 8.  HTN pregnancy w/ out evidence qof Pre-E 9. FHR decel, tracing improved, reactive 10. [facility] [place] [phone]  Plan:  1. Admit to BS per consult with MD 2. Routine L&D orders 3. Analgesia/anesthesia PRN  4. Augment w/ pitocin PRN 5. SW consult PP 6. [facility] and than have baby taken to nursery  7. UDS  Dorathy KinsmanVirginia Briya Lookabaugh 01/21/2016, 5:14 AM

## 2016-01-22 NOTE — Anesthesia Postprocedure Evaluation (Signed)
Anesthesia Post Note  Patient: Joyce Ward  Procedure(s) Performed: * No procedures listed *  Patient location during evaluation: Mother Baby Anesthesia Type: Epidural Level of consciousness: awake and alert Pain management: pain level controlled Vital Signs Assessment: post-procedure vital signs reviewed and stable Respiratory status: spontaneous breathing, nonlabored ventilation and respiratory function stable Cardiovascular status: stable Postop Assessment: no headache, no backache and epidural receding Anesthetic complications: no       Last Vitals: There were no vitals filed for this visit.  Last Pain: There were no vitals filed for this visit.               Cecile HearingStephen Edward Turk

## 2016-01-29 ENCOUNTER — Telehealth: Payer: Self-pay | Admitting: *Deleted

## 2016-01-29 NOTE — Telephone Encounter (Signed)
[place] need to call patient to see if she wants to get a BTL, if so, needs to come into office asap to sign papers. Has pp appt scheduled.

## 2016-01-29 NOTE — Telephone Encounter (Signed)
I called Joyce Ward and she does want to get a btl- agreed to come in to sign papers tomorrow 01/30/16 10:00. We discussed she has a pp appt scheduled also but needs papers signed at least 30 days before surgery. She voices understanding.

## 2016-01-30 ENCOUNTER — Ambulatory Visit: Payer: Self-pay

## 2016-02-04 ENCOUNTER — Emergency Department (HOSPITAL_COMMUNITY)
Admission: EM | Admit: 2016-02-04 | Discharge: 2016-02-04 | Disposition: A | Discharge status: Home / Self Care | Payer: Medicaid Other | Attending: Emergency Medicine | Admitting: Emergency Medicine

## 2016-02-04 ENCOUNTER — Emergency Department (HOSPITAL_COMMUNITY): Payer: Medicaid Other

## 2016-02-04 ENCOUNTER — Encounter (HOSPITAL_COMMUNITY): Payer: Self-pay | Admitting: Emergency Medicine

## 2016-02-04 DIAGNOSIS — F17210 Nicotine dependence, cigarettes, uncomplicated: Secondary | ICD-10-CM | POA: Insufficient documentation

## 2016-02-04 DIAGNOSIS — J40 Bronchitis, not specified as acute or chronic: Secondary | ICD-10-CM | POA: Diagnosis not present

## 2016-02-04 DIAGNOSIS — R05 Cough: Secondary | ICD-10-CM | POA: Diagnosis present

## 2016-02-04 MED ORDER — AZITHROMYCIN 250 MG PO TABS: ORAL_TABLET | ORAL | 0 refills | Status: DC

## 2016-02-04 MED ORDER — BENZONATATE 100 MG PO CAPS: 100.0000 mg | ORAL_CAPSULE | Freq: Three times a day (TID) | ORAL | 0 refills | Status: DC

## 2016-02-04 NOTE — ED Provider Notes (Signed)
[facility]-EMER[facility]ENCY DEPT Provider Note   CSN: [id number] Arrival date & time: 02/04/16  4098  By signing my name below, I, Joyce Ward, attest that this documentation has been prepared under the direction and in the presence of Joyce Berkshire, MD. Electronically Signed: Cynda Ward, Scribe. 02/04/16. 9:50 AM.    History   Chief Complaint Chief Complaint  Patient presents with  . Chills    HPI Comments: Joyce Ward is a 31 y.o. female with a hx of bronchitis, who presents to the Emergency Department complaining of an intermittent non productive cough that began one week ago. Patient states when she coughs the pain is unbearable. She has associated chills. patient reports taking mucinex, robitussin, and Nyquil with no improvement. She states smoking makes the pain worse. She denies any other symptoms.    The history is provided by the patient. No language interpreter was used.  Cough  This is a new problem. Episode onset: One week ago. The problem occurs every few hours. The cough is non-productive. There has been no fever. Associated symptoms include chills. Pertinent negatives include no chest pain and no headaches. She has tried cough syrup for the symptoms. The treatment provided no relief. She is a smoker. Her past medical history is significant for bronchitis.    Past Medical History:  Diagnosis Date  . Bronchitis   . Depression   . Endometriosis   . GERD (gastroesophageal reflux disease)   . History of endometriosis 08/19/2014  . History of prescription drug abuse (HCC) 2016  . Irregular intermenstrual bleeding 08/19/2014  . Pelvic pain in female 08/19/2014  . UTI (urinary tract infection)     Patient Active Problem List   Diagnosis Date Noted  . Hypertension affecting pregnancy, third trimester 01/20/2016  . Drug abuse during pregnancy 11/17/2015  . Supervision of high risk pregnancy, antepartum 11/12/2015  . Alcohol abuse 06/01/2015  . Major depressive disorder,  single episode, severe without psychotic features (HCC) 05/31/2015  . Opiate use 12/19/2014  . Dysmenorrhea 12/09/2014  . Allergic reaction caused by a drug 10/02/2014  . Pelvic pain in female 08/19/2014  . History of endometriosis 08/19/2014  . Irregular intermenstrual bleeding 08/19/2014    Past Surgical History:  Procedure Laterality Date  . LAPAROSCOPY      OB History    Gravida Para Term Preterm AB Living   5 3 3   2 3    SAB TAB Ectopic Multiple Live Births   2     0 1       Home Medications    Prior to Admission medications   Medication Sig Start Date End Date Taking? Authorizing Provider  ibuprofen (ADVIL,MOTRIN) 600 MG tablet Take 1 tablet (600 mg total) by mouth every 6 (six) hours. 01/22/16  Yes Tilda Burrow, MD  cyclobenzaprine (FLEXERIL) 10 MG tablet Take 1 tablet (10 mg total) by mouth 3 (three) times daily as needed for muscle spasms. Patient not taking: Reported on 02/04/2016 01/21/16   Tilda Burrow, MD  oxyCODONE-acetaminophen (PERCOCET/ROXICET) 5-325 MG tablet Take 1 tablet by mouth every 6 (six) hours as needed for severe pain. Patient not taking: Reported on 02/04/2016 01/21/16   Tilda Burrow, MD    Family History Family History  Problem Relation Age of Onset  . Endometriosis Mother     Social History Social History  Substance Use Topics  . Smoking status: Current Some Day Smoker    Packs/day: 1.00    Years: 5.00    Types: Cigarettes  .  Smokeless tobacco: Never Used  . Alcohol use Yes     Comment: occasional     Allergies   Codeine; Latex; Naproxen; Toradol [ketorolac tromethamine]; and Tramadol   Review of Systems Review of Systems  Constitutional: Positive for chills. Negative for appetite change and fatigue.  HENT: Negative for congestion, ear discharge and sinus pressure.   Eyes: Negative for discharge.  Respiratory: Positive for cough.   Cardiovascular: Negative for chest pain.  Gastrointestinal: Negative for abdominal  pain and diarrhea.  Genitourinary: Negative for frequency and hematuria.  Musculoskeletal: Negative for back pain.  Skin: Negative for rash.  Neurological: Negative for seizures and headaches.  Psychiatric/Behavioral: Negative for hallucinations.     Physical Exam Updated Vital Signs BP 116/73 (BP Location: Left Arm)   Pulse 85   Temp 98.5 F (36.9 C)   Resp 16   Ht 5\' 3"  (1.6 m)   Wt 138 lb (62.6 kg)   LMP 05/12/2015   SpO2 100%   BMI 24.45 kg/m   Physical Exam  Constitutional: She is oriented to person, place, and time. She appears well-developed.  HENT:  Head: Normocephalic.  Eyes: Conjunctivae and EOM are normal. No scleral icterus.  Neck: Neck supple. No thyromegaly present.  Cardiovascular: Normal rate and regular rhythm.  Exam reveals no gallop and no friction rub.   No murmur heard. Pulmonary/Chest: No stridor. She has no wheezes. She has no rales. She exhibits no tenderness.  Abdominal: She exhibits no distension. There is no tenderness. There is no rebound.  Musculoskeletal: Normal range of motion. She exhibits no edema.  Lymphadenopathy:    She has no cervical adenopathy.  Neurological: She is oriented to person, place, and time. She exhibits normal muscle tone. Coordination normal.  Skin: No rash noted. No erythema.  Psychiatric: She has a normal mood and affect. Her behavior is normal.     ED Treatments / Results  DIAGNOSTIC STUDIES: Oxygen Saturation is 100% on RA, normal by my interpretation.    COORDINATION OF CARE: 9:49 AM Discussed treatment plan with pt at bedside and pt agreed to plan.  Labs (all labs ordered are listed, but only abnormal results are displayed) Labs Reviewed - No data to display  EKG  EKG Interpretation None       Radiology No results found.  Procedures Procedures (including critical care time)  Medications Ordered in ED Medications - No data to display   Initial Impression / Assessment and Plan / ED Course  I  have reviewed the triage vital signs and the nursing notes.  Pertinent labs & imaging results that were available during my care of the patient were reviewed by me and considered in my medical decision making (see chart for details).  Clinical Course     Bronchitis,  zpack  Final Clinical Impressions(s) / ED Diagnoses   Final diagnoses:  None    New Prescriptions New Prescriptions   No medications on file   The chart was scribed for me under my direct supervision.  I personally performed the history, physical, and medical decision making and all procedures in the evaluation of this patient.Joyce Ward.    Braidyn Scorsone, MD 02/04/16 [phone]

## 2016-02-04 NOTE — ED Triage Notes (Signed)
Having cold symptoms for last week.  C/o chills and non-productive cough.

## 2016-02-04 NOTE — Discharge Instructions (Signed)
Drink plenty of fluids and follow-up next week not improving

## 2016-02-19 ENCOUNTER — Ambulatory Visit (INDEPENDENT_AMBULATORY_CARE_PROVIDER_SITE_OTHER): Payer: Medicaid Other | Admitting: Advanced Practice Midwife

## 2016-02-19 ENCOUNTER — Encounter: Payer: Self-pay | Admitting: Advanced Practice Midwife

## 2016-02-19 VITALS — BP 100/67 | HR 85 | Temp 98.6°F | Wt 134.7 lb

## 2016-02-19 DIAGNOSIS — J209 Acute bronchitis, unspecified: Secondary | ICD-10-CM

## 2016-02-19 DIAGNOSIS — G4709 Other insomnia: Secondary | ICD-10-CM

## 2016-02-19 MED ORDER — ZOLPIDEM TARTRATE 10 MG PO TABS: 10.0000 mg | ORAL_TABLET | Freq: Every evening | ORAL | 3 refills | Status: AC | PRN

## 2016-02-19 MED ORDER — AZITHROMYCIN 250 MG PO TABS: ORAL_TABLET | ORAL | 0 refills | Status: DC

## 2016-02-19 NOTE — Progress Notes (Signed)
Subjective:     Joyce Ward is a 31 y.o. female who presents for a postpartum visit. She is 4 weeks postpartum following a spontaneous vaginal delivery. I have fully reviewed the prenatal and intrapartum course. The delivery was at 39.1 gestational weeks. Outcome: spontaneous vaginal delivery. Anesthesia: none. Postpartum course has been complicated by worsening respiratory infection x 3-4 weeks. Seen at ED 02/04/16. Dx Bronchitis. Rx Z-pack. Also takign OTC Mucinex, Sudafed. No improvement. No w/ worsening cough, congestion. No fever, chills or sore throat. Baby's course has been been uncomplicated as far as she knows. Was put up for adoption. Baby is feeding by bottle -  . Bleeding staining only. Bowel function is normal. Bladder function is normal. Patient is not sexually active. Contraception method is abstinence. Postpartum depression screening: negative. States her moods are up and down, but getting better and thinks she's coping normally under the circumstances. Has a counselor through the adoption agency. Denies SI/HI.   The following portions of the patient's history were reviewed and updated as appropriate: allergies, current medications, past family history, past medical history, past social history, past surgical history and problem list.  Review of Systems Pertinent items are noted in HPI.   C/O insomnia  Objective:    BP 100/67   Pulse 85   Temp 98.6 F (37 C)   Wt 134 lb 11.2 oz (61.1 kg)   Breastfeeding? No   BMI 23.86 kg/m   General:  alert, cooperative, appears older than stated age and no distress  Head Pos nasal congestion. Sinuses NT.    Breasts:  Declined  Lungs: clear to auscultation bilaterally. Productive cough.   Heart:  regular rate and rhythm, S1, S2 normal, no murmur, click, rub or gallop  Abdomen: soft, non-tender; bowel sounds normal; no masses,  no organomegaly   Vulva:  not evaluated  Rectal Exam: Not performed.        Assessment:     Normal  postpartum exam. Pap smear not done at today's visit.   1. Acute bronchitis, unspecified organism. Concern for secondary bacterial infection due to worsening Sx. Will repeat Z-pack (DOubt infection was viral 02/04/16)  - Rx Z-pack - [facility] - Increase fluids and rest - Decrease smoking - ED for Pneumonia Sx.   2. Other insomnia  - zolpidem (AMBIEN) 10 MG tablet; Take 1 tablet (10 mg total) by mouth at bedtime as needed for sleep.  Dispense: 30 tablet; Refill: 3   Plan:    1. Contraception: abstinence. Declined [facility], BTS. Has info.  2. Referred to [facility] if not improvement w/ Z-Pac.  3. Follow up in: 1 year for annual exam or PRN for contraception.  or as needed.   4. Offered for pt to speak w/ Asher MuirJamie for mood concerns, declined.

## 2016-02-19 NOTE — Progress Notes (Signed)
C/o has been sick for 3 weeks, has been to ER with c/o sometimes productive cough of whitish- yellow phlegm.  C/o pain in chest. C/o fevers at time. Wants to see if she can get more meds. Discussed birth control - patient does not want to sign btl papers as she now does not want a permanent birth control . Also states not sure what birth control she wants - states she needs to think about it and also that she doesn't need it because she is not having intercourse at present.

## 2016-02-19 NOTE — Patient Instructions (Signed)

## 2016-02-20 ENCOUNTER — Encounter: Payer: Self-pay | Admitting: Advanced Practice Midwife

## 2016-03-02 ENCOUNTER — Ambulatory Visit: Payer: Self-pay | Admitting: Advanced Practice Midwife

## 2016-03-03 ENCOUNTER — Ambulatory Visit: Payer: Self-pay | Admitting: Obstetrics & Gynecology

## 2016-03-04 ENCOUNTER — Ambulatory Visit: Payer: Self-pay | Admitting: Obstetrics and Gynecology

## 2016-03-09 ENCOUNTER — Encounter: Payer: Self-pay | Admitting: Obstetrics and Gynecology

## 2016-03-09 ENCOUNTER — Ambulatory Visit (INDEPENDENT_AMBULATORY_CARE_PROVIDER_SITE_OTHER): Payer: Medicaid Other | Admitting: Obstetrics and Gynecology

## 2016-03-09 VITALS — BP 107/64 | HR 88 | Wt 131.1 lb

## 2016-03-09 DIAGNOSIS — G8929 Other chronic pain: Secondary | ICD-10-CM

## 2016-03-09 DIAGNOSIS — R102 Pelvic and perineal pain: Secondary | ICD-10-CM

## 2016-03-09 DIAGNOSIS — N912 Amenorrhea, unspecified: Secondary | ICD-10-CM

## 2016-03-09 LAB — POCT URINALYSIS DIP (DEVICE)
BILIRUBIN URINE: NEGATIVE
Glucose, UA: NEGATIVE mg/dL
KETONES UR: 15 mg/dL — AB
Leukocytes, UA: NEGATIVE
Nitrite: NEGATIVE
PH: 7 (ref 5.0–8.0)
PROTEIN: NEGATIVE mg/dL
SPECIFIC GRAVITY, URINE: 1.015 (ref 1.005–1.030)
Urobilinogen, UA: 0.2 mg/dL (ref 0.0–1.0)

## 2016-03-09 MED ORDER — MEDROXYPROGESTERONE ACETATE 10 MG PO TABS: 10.0000 mg | ORAL_TABLET | Freq: Every day | ORAL | 0 refills | Status: DC

## 2016-03-09 MED ORDER — MEDROXYPROGESTERONE ACETATE 10 MG PO TABS: 10.0000 mg | ORAL_TABLET | Freq: Every day | ORAL | 0 refills | Status: AC

## 2016-03-09 NOTE — Progress Notes (Signed)
Obstetrics and Gynecology Visit Established Patient Work In Visit  Appointment Date: 03/09/2016  OBGYN Clinic: [facility]  Primary Care Provider: No PCP Per Patient  Chief Complaint:  Chief Complaint  Patient presents with  . Pelvic Pain    History of Present Illness: Joyce Ward is a 31 y.o. Caucasian [id number] (No LMP recorded.), seen for the above chief complaint. Her past medical history is significant for chronic pelvic pain, questionable drug seeking behavior, h/o l/s confirmed endometriosis, h/o depression.   Patient endorses 1wk pelvic pain. Patient states it feels midline and low belly and comes and goes and is present most days and lasts for hours, feels like "barbed wires tightening on her ovaries", non radiating and not similar to period pains. Patient is s/p SVD around christmas-time 2017 and had uncomplicated SVD. She is not breastfeeding or pumping (pt gave baby up for adoption). She also denies any sexual intercourse or anything for [facility] since [facility]. She denies having had a period since delivering.   She had a normal PP visit and was undecided on BC at that time.   No VB, discharge, itching, dysuria, hematuria, nausea, vomiting, blood in BMs, diarrhea, constipation, fevers, chills, chest pain, SOB   Review of Systems:as noted in the History of Present Illness.  Past Medical History:  Past Medical History:  Diagnosis Date  . Bronchitis   . Depression   . 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 Dysmenorrhea 12/09/2014  . Endometriosis   . GERD (gastroesophageal reflux disease)   . History of endometriosis 08/19/2014  . History of prescription drug abuse (HCC) 2016  . Hypertension affecting pregnancy, third trimester 01/20/2016  . Irregular intermenstrual bleeding 08/19/2014  . Pelvic pain in female 08/19/2014  . UTI (urinary tract infection)     Past Surgical History:  Past Surgical History:   Procedure Laterality Date  . LAPAROSCOPY      Past Obstetrical History:  OB History  Gravida Para Term Preterm AB Living  5 3 3   2 3   SAB TAB Ectopic Multiple Live Births  2     0 1    # Outcome Date GA Lbr Len/2nd Weight Sex Delivery Anes PTL Lv  5 Term 01/21/16 6282w1d 04:37 / 00:17 6 lb 14.9 oz (3.145 kg) F Vag-Spont EPI  LIV     Birth Comments: none  4 Term 09/01/13 578w0d   F Vag-Spont        Birth Comments: no complications, [facility] [place]  3 Term 07/01/12 9278w0d   M Vag-Spont        Birth Comments: had meconium before delivery, [facility] [place]  2 SAB 01/2010          1 SAB 01/2009              Past Gynecological History: As per HPI. Pap negative 2017  Social History:  Social History   Social History  . Marital status: Single    Spouse name: N/A  . Number of children: N/A  . Years of education: N/A   Occupational History  . Not on file.   Social History Main Topics  . Smoking status: Current Some Day Smoker    Packs/day: 1.00    Years: 5.00    Types: Cigarettes  . Smokeless tobacco: Never Used  . Alcohol use Yes     Comment: occasional  . Drug use: Yes    Types:  Marijuana     Comment: HX Rx drug abuse  . Sexual activity: Not Currently    Birth control/ protection: None   Other Topics Concern  . Not on file   Social History Narrative  . No narrative on file    Family History:  Family History  Problem Relation Age of Onset  . Endometriosis Mother      Medications Ibuprofen and ambien  Allergies Codeine; Latex; Naproxen; Toradol [ketorolac tromethamine]; and Tramadol   Physical Exam:  BP 107/64   Pulse 88   Wt 131 lb 1.6 oz (59.5 kg)   BMI 23.22 kg/m  Body mass index is 23.22 kg/m. General appearance: Well nourished, well developed female in no acute distress.  Cardiovascular: normal s1 and s2.  No murmurs, rubs or gallops. Respiratory:  Clear to auscultation bilateral. Normal respiratory  effort Abdomen: positive bowel sounds and no masses, hernias; non distended. Patient notes mild ttp in the midline, suprapubic area Back: no CVAT Neuro/Psych:  Normal mood and affect.  Skin:  Warm and dry.  Lymphatic:  No inguinal lymphadenopathy.   Pelvic exam: is not limited by body habitus EGBUS: within normal limits, Vagina: within normal limits and with no blood or discharge in the vault, Cervix: normal appearing cervix without tenderness, discharge or lesions. Uterus:  nonenlarged and non tender, above abdominal ttp doesn't seem fundal/uterine and Adnexa:  normal adnexa and no mass, fullness, tenderness Rectovaginal: deferred  Laboratory: UPT negative, POC U/A negative  Radiology: none  Assessment: chronic pelvic pain. Patient stable  Plan:  No e/o infection and exam unremarkable. In looking at the chart, she was seeing [facility] OBGYN Dr. Emelda Fear for several visits, in the latter part of 2016, and he noted drug seeking behavior (notes reviewed). She also has multiple ED visits. I told her that her pain could be related to be amenorrheic and I advised provera 10mg  po qday x 14d and should expect a period a few days after this and if not, to give [place] a call for further evaluation.  She is requesting narcotic pain medications but I told her that I only feel comfortable trying a prescription NSAID and not doing narcotics, but that's my comfort level in these instances. She was very upset by this, but I told her that's my policy in new patients and instances such as this.  RTC PRN  Cornelia Copa MD Attending [facility] [employer] [facility])

## 2016-03-10 DIAGNOSIS — N912 Amenorrhea, unspecified: Secondary | ICD-10-CM | POA: Insufficient documentation

## 2016-04-02 ENCOUNTER — Ambulatory Visit: Payer: Self-pay | Admitting: Obstetrics and Gynecology

## 2016-04-04 ENCOUNTER — Encounter: Payer: Self-pay | Admitting: Obstetrics and Gynecology

## 2016-04-04 NOTE — Progress Notes (Signed)
Patient did not keep GYN appointment for 04/02/2016.  Ramyah Pankowski, Jr MD Attending [facility]   

## 2016-05-04 ENCOUNTER — Telehealth: Payer: Self-pay | Admitting: *Deleted

## 2016-05-04 NOTE — Telephone Encounter (Signed)
Per Dorathy Kinsman, CNM- since patient is not pregnant - needs to see PCP or psychiatric provider due to History of drug seeking

## 2016-05-04 NOTE — Telephone Encounter (Signed)
Joyce Ward called today and left a message she has a prescription for ambien and is having bad reaction to it. Wants to know if she can swap it out for something else. Asks for a call back.   I called Joyce Ward and she reports she had presciption filled in January and she has one refill left and some in current bottle but she has stopped taking it because it was making her sleepwalk- states a neighbor found her sleepwalking outside her house , is also irritable.  I advised her to not take any more ambien and I will send a message to her provider to see if she can have a different medicine. She voices understanding.

## 2016-05-06 NOTE — Telephone Encounter (Signed)
Called patient, phone rings then disconnects. No voicemail

## 2016-05-12 ENCOUNTER — Ambulatory Visit (HOSPITAL_COMMUNITY): Payer: Self-pay

## 2016-05-12 ENCOUNTER — Emergency Department (HOSPITAL_COMMUNITY)
Admission: EM | Admit: 2016-05-12 | Discharge: 2016-05-12 | Disposition: A | Discharge status: Home / Self Care | Payer: Medicaid Other | Attending: Emergency Medicine | Admitting: Emergency Medicine

## 2016-05-12 ENCOUNTER — Encounter (HOSPITAL_COMMUNITY): Payer: Self-pay

## 2016-05-12 DIAGNOSIS — R51 Headache: Secondary | ICD-10-CM | POA: Insufficient documentation

## 2016-05-12 DIAGNOSIS — Z79899 Other long term (current) drug therapy: Secondary | ICD-10-CM | POA: Insufficient documentation

## 2016-05-12 DIAGNOSIS — F17210 Nicotine dependence, cigarettes, uncomplicated: Secondary | ICD-10-CM | POA: Diagnosis not present

## 2016-05-12 DIAGNOSIS — Z765 Malingerer [conscious simulation]: Secondary | ICD-10-CM

## 2016-05-12 DIAGNOSIS — Z5321 Procedure and treatment not carried out due to patient leaving prior to being seen by health care provider: Secondary | ICD-10-CM | POA: Diagnosis not present

## 2016-05-12 DIAGNOSIS — G8929 Other chronic pain: Secondary | ICD-10-CM

## 2016-05-12 DIAGNOSIS — F1290 Cannabis use, unspecified, uncomplicated: Secondary | ICD-10-CM | POA: Insufficient documentation

## 2016-05-12 MED ORDER — DIPHENHYDRAMINE HCL 50 MG/ML IJ SOLN: 50.0000 mg | Freq: Once | INTRAMUSCULAR | Status: DC

## 2016-05-12 MED ORDER — DEXAMETHASONE 4 MG PO TABS: 8.0000 mg | ORAL_TABLET | Freq: Once | ORAL | Status: DC

## 2016-05-12 MED ORDER — ACETAMINOPHEN 500 MG PO TABS
1000.0000 mg | ORAL_TABLET | Freq: Once | ORAL | Status: DC
Start: 2016-05-12 — End: 2016-05-12

## 2016-05-12 MED ORDER — METOCLOPRAMIDE HCL 5 MG/ML IJ SOLN: 10.0000 mg | Freq: Once | INTRAMUSCULAR | Status: DC

## 2016-05-12 NOTE — ED Notes (Signed)
PT states she doesn't want to stay to get the IV meds and head CT. PT walked out of the ED at this time.

## 2016-05-12 NOTE — ED Provider Notes (Signed)
[facility]-EMER[facility]ENCY DEPT Provider Note   CSN: [id number] Arrival date & time: 05/12/16  3474     History   Chief Complaint Chief Complaint  Patient presents with  . Headache    HPI Joyce Ward is a 31 y.o. female.  HPI  Pt was seen at 0750. Per pt, c/o gradual onset and persistence of constant generalized headache for the past 2 months, worse over the past 2 weeks.  Describes the headache as "pressure" all over her head and face. States she will occasionally see "squiggly lines" in her vision. States she has taken tylenol, ibuprofen and Excedrin without change. Denies headache was sudden or maximal in onset or at any time.  Denies visual changes, no focal motor weakness, no tingling/numbness in extremities, no fevers, no neck pain, no rash, no N/V/D.     Past Medical History:  Diagnosis Date  . Bronchitis   . Depression   . 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 Dysmenorrhea 12/09/2014  . Endometriosis   . GERD (gastroesophageal reflux disease)   . History of endometriosis 08/19/2014  . History of prescription drug abuse (HCC) 2016  . Hypertension affecting pregnancy, third trimester 01/20/2016  . Irregular intermenstrual bleeding 08/19/2014  . Pelvic pain in female 08/19/2014  . UTI (urinary tract infection)     Patient Active Problem List   Diagnosis Date Noted  . Amenorrhea 03/10/2016  . Alcohol abuse 06/01/2015  . Major depressive disorder, single episode, severe without psychotic features (HCC) 05/31/2015  . Opiate use 12/19/2014  . Allergic reaction caused by a drug 10/02/2014  . Chronic pelvic pain in female 08/19/2014  . History of endometriosis 08/19/2014    Past Surgical History:  Procedure Laterality Date  . LAPAROSCOPY  2006   and endometiosis ablation. op note in "media" tab    OB History    Gravida Para Term Preterm AB Living   SAB TAB Ectopic Multiple Live Births   2     0 1       Home  Medications    Prior to Admission medications   Medication Sig Start Date End Date Taking? Authorizing Provider  ibuprofen (ADVIL,MOTRIN) 600 MG tablet Take 1 tablet (600 mg total) by mouth every 6 (six) hours. 01/22/16   Tilda Burrow, MD  medroxyPROGESTERone (PROVERA) 10 MG tablet Take 1 tablet (10 mg total) by mouth daily. 03/09/16   Van Bing, MD  zolpidem (AMBIEN) 10 MG tablet Take 1 tablet (10 mg total) by mouth at bedtime as needed for sleep. 02/19/16 03/20/16  Dorathy Kinsman, CNM    Family History Family History  Problem Relation Age of Onset  . Endometriosis Mother     Social History Social History  Substance Use Topics  . Smoking status: Current Some Day Smoker    Packs/day: 1.00    Years: 5.00    Types: Cigarettes  . Smokeless tobacco: Never Used  . Alcohol use Yes     Comment: occasional     Allergies   Codeine; Latex; Naproxen; Toradol [ketorolac tromethamine]; and Tramadol   Review of Systems Review of Systems ROS: Statement: All systems negative except as marked or noted in the HPI; Constitutional: Negative for fever and chills. ; ; Eyes: Negative for eye pain, redness and discharge. ; ; ENMT: Negative for ear pain, hoarseness, nasal congestion, sinus pressure and sore throat. ; ; Cardiovascular: Negative for chest pain, palpitations, diaphoresis, dyspnea and  peripheral edema. ; ; Respiratory: Negative for cough, wheezing and stridor. ; ; Gastrointestinal: Negative for nausea, vomiting, diarrhea, abdominal pain, blood in stool, hematemesis, jaundice and rectal bleeding. . ; ; Genitourinary: Negative for dysuria, flank pain and hematuria. ; ; Musculoskeletal: Negative for back pain and neck pain. Negative for swelling and trauma.; ; Skin: Negative for pruritus, rash, abrasions, blisters, bruising and skin lesion.; ; Neuro: +headache. Negative for lightheadedness and neck stiffness. Negative for weakness, altered level of consciousness, altered mental status,  extremity weakness, paresthesias, involuntary movement, seizure and syncope.       Physical Exam Updated Vital Signs BP 104/71 (BP Location: Right Arm)   Pulse 91   Temp 98.3 F (36.8 C) (Oral)   Resp 16   Ht  (1.626 m)   Wt 126 lb (57.2 kg)   SpO2 100%   BMI 21.63 kg/m   Physical Exam 0755: Physical examination:  Nursing notes reviewed; Vital signs and O2 SAT reviewed;  Constitutional: Well developed, Well nourished, Well hydrated, In no acute distress; Head:  Normocephalic, atraumatic; Eyes: EOMI, PERRL, No scleral icterus; ENMT: TM's clear bilat. +edemetous nasal turbinates bilat with clear rhinorrhea. Mouth and pharynx normal, Mucous membranes moist; Neck: Supple, Full range of motion, No lymphadenopathy; Cardiovascular: Regular rate and rhythm, No gallop; Respiratory: Breath sounds clear & equal bilaterally, No wheezes.  Speaking full sentences with ease, Normal respiratory effort/excursion; Chest: Nontender, Movement normal; Abdomen: Soft, Nontender, Nondistended, Normal bowel sounds; Genitourinary: No CVA tenderness; Extremities: Pulses normal, No tenderness, No edema, No calf edema or asymmetry.; Neuro: AA&Ox3, Major CN grossly intact.  Speech clear. No gross focal motor or sensory deficits in extremities. Climbs on and off stretcher easily by herself. Gait steady.; Skin: Color normal, Warm, Dry.   ED Treatments / Results  Labs (all labs ordered are listed, but only abnormal results are displayed)   EKG  EKG Interpretation None       Radiology   Procedures Procedures (including critical care time)  Medications Ordered in ED Medications  acetaminophen (TYLENOL) tablet 1,000 mg (not administered)  diphenhydrAMINE (BENADRYL) injection 50 mg (not administered)  metoCLOPramide (REGLAN) injection 10 mg (not administered)  dexamethasone (DECADRON) tablet 8 mg (not administered)     Initial Impression / Assessment and Plan / ED Course  I have reviewed the  triage vital signs and the nursing notes.  Pertinent labs & imaging results that were available during my care of the patient were reviewed by me and considered in my medical decision making (see chart for details).  MDM Reviewed: previous chart, nursing note and vitals     0805:  I explained to pt that she will receive standard treatment/medications for her headache while a CT of her head is obtained. I told her what those medications will be (see above).  After I walked out of the exam room, pt told ED RN she did not want to stay for those medications and CT and left the ED. Concern for drug seeking behavior.     Final Clinical Impressions(s) / ED Diagnoses   Final diagnoses:  Chronic nonintractable headache, unspecified headache type  Drug-seeking behavior    New Prescriptions Discharge Medication List as of 05/12/2016  8:10 AM       Samuel Jester, DO 05/16/16 1629

## 2016-05-12 NOTE — ED Triage Notes (Signed)
Pt. Has been experiencing extreme headaches for the past 2 months but has stated that in the last 2 weeks it has gotten much worse. Pt. Says pain feels like a lot of buildup pressure that she can also feel in her face. Has tried ibuprofen, tylenol, and excedrin, but it did not relieve pain.

## 2016-05-26 ENCOUNTER — Telehealth: Payer: Self-pay | Admitting: General Practice

## 2016-05-26 NOTE — Telephone Encounter (Signed)
Patient called into front office stating she is having problems with her probation officer. Patient states that she was given percocet after delivery. Patient states she didn't take that much of it because it made her sick and she still had some left in the bottle. Patient states her probation officer is accusing her of having expired percocet. Patient is requesting a print off that states when the prescription was prescribed and when it expires. Told patient I couldn't do that as I didn't have a way to but her pharmacy could. Patient verbalized understanding & had no questions

## 2016-06-28 IMAGING — CT CT PELVIS W/ CM
2 of 4 series · 16 of 46 positions shown, 18 images · IV contrast (Omnipaque 300)
Comparison: Left hip radiographs performed 07/02/2014

CLINICAL DATA: Acute onset of vaginal bleeding and severe pelvic
cramping. Initial encounter.

EXAM:
CT PELVIS WITH CONTRAST
TECHNIQUE: Multidetector CT imaging of the pelvis was performed using the
standard protocol following the bolus administration of intravenous
contrast.
CONTRAST:  100mL OMNIPAQUE IOHEXOL 300 MG/ML  SOLN

[Series 2: pelvis 5.0 b40f · axial · 0.69mm/px · z∈[-282,-86]mm · 13 of 47 slices shown, 15 images]
[im 4/47  soft-tissue]
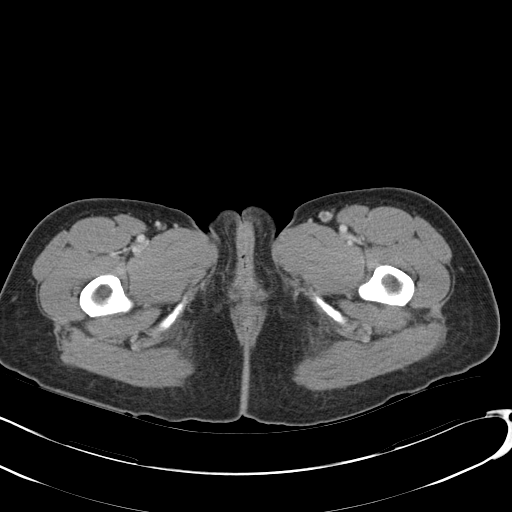
[im 4/47  bone]
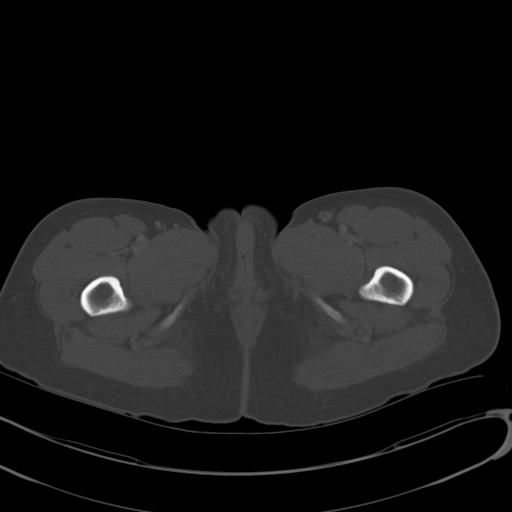
[im 7/47  soft-tissue]
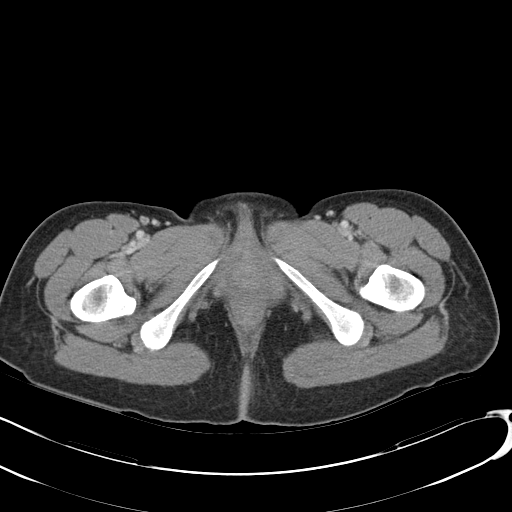
[im 10/47  soft-tissue]
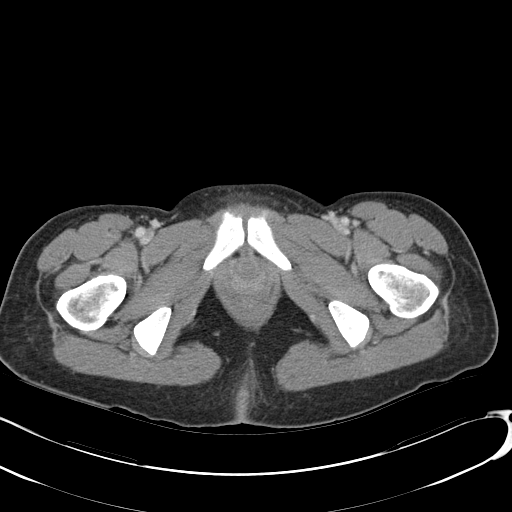
[im 13/47  soft-tissue]
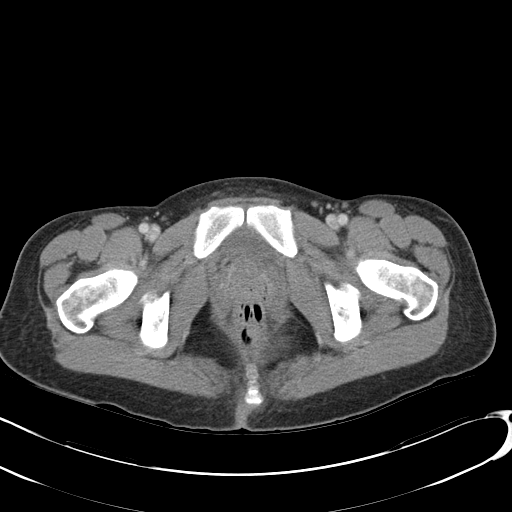
[im 16/47  soft-tissue]
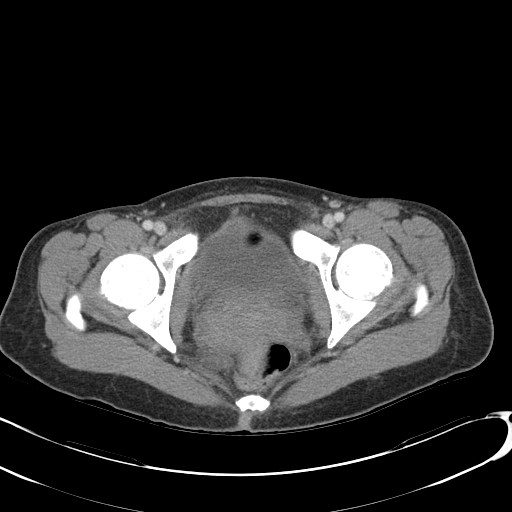
[im 19/47  soft-tissue]
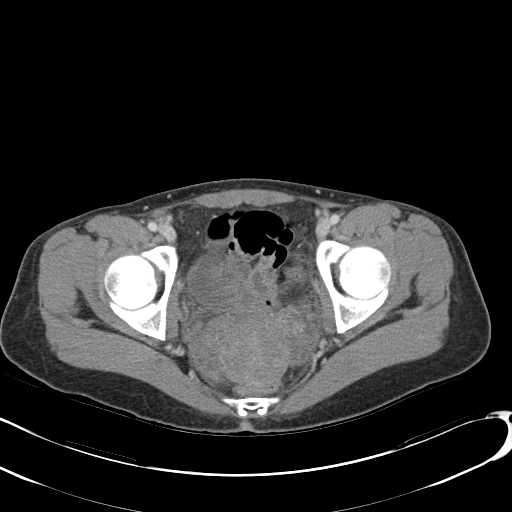
[im 25/47  soft-tissue]
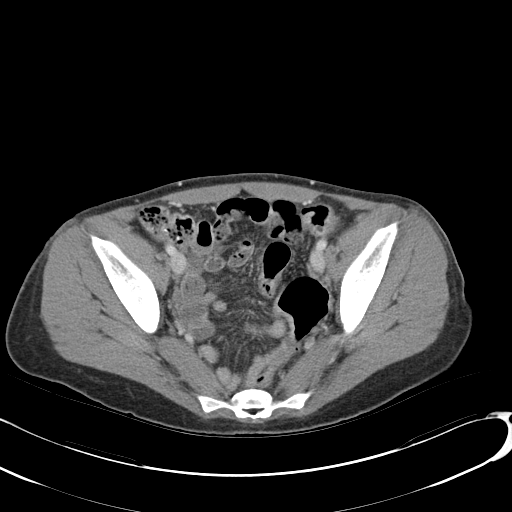
[im 28/47  soft-tissue]
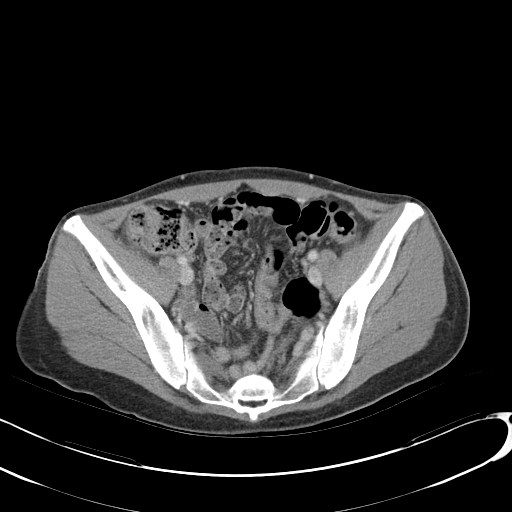
[im 31/47  soft-tissue]
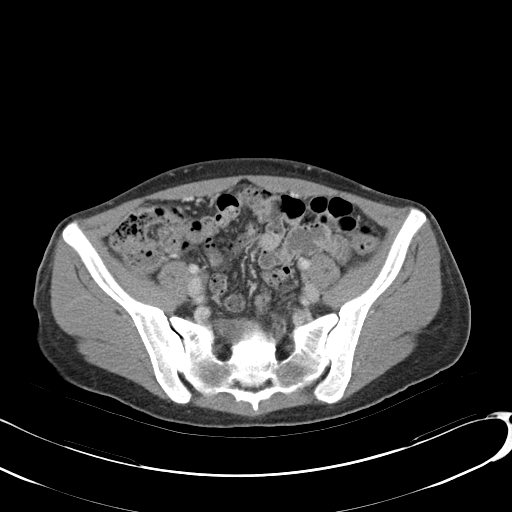
[im 31/47  bone]
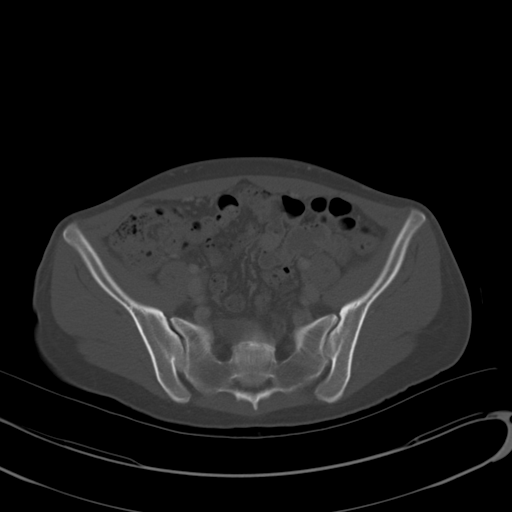
[im 34/47  soft-tissue]
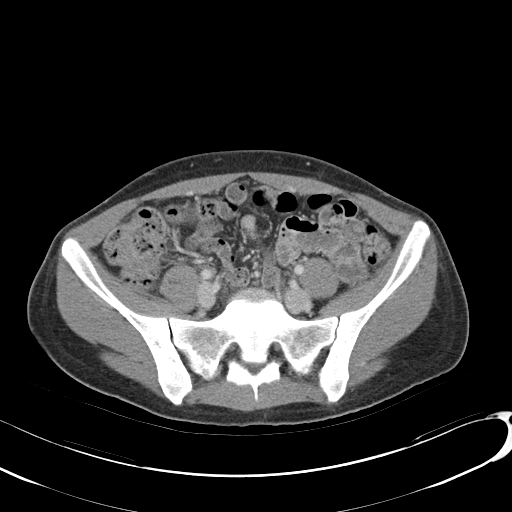
[im 37/47  soft-tissue]
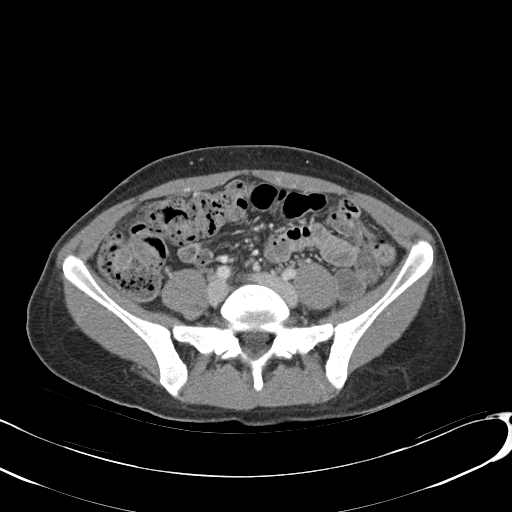
[im 40/47  soft-tissue]
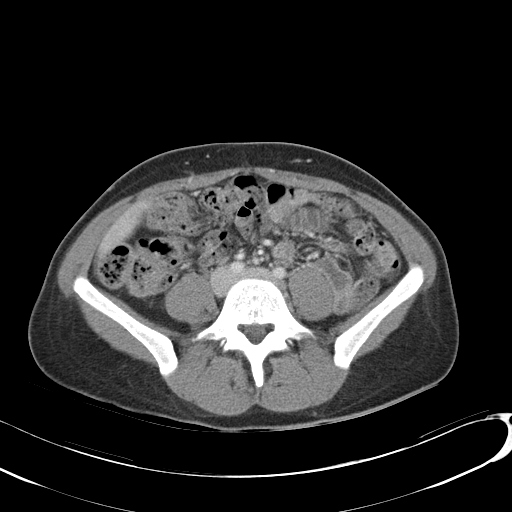
[im 43/47  soft-tissue]
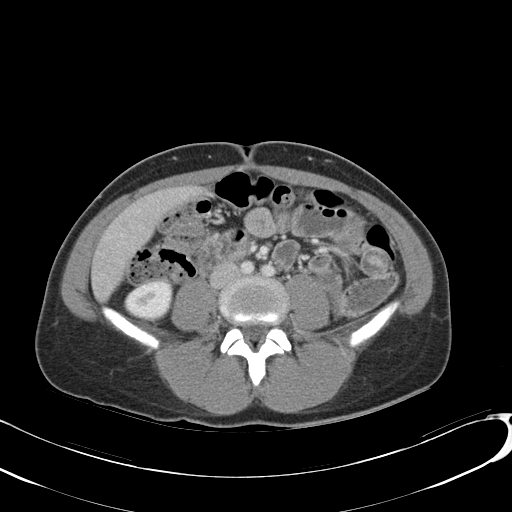

[Series 3: mpr coronal a/p cor · coronal · 0.45mm/px · 3 of 62 slices shown]
[im 21/62  soft-tissue]
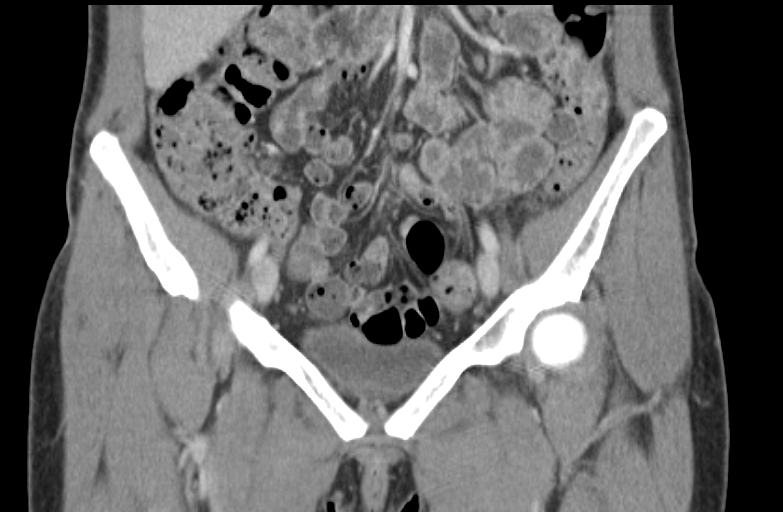
[im 28/62  soft-tissue]
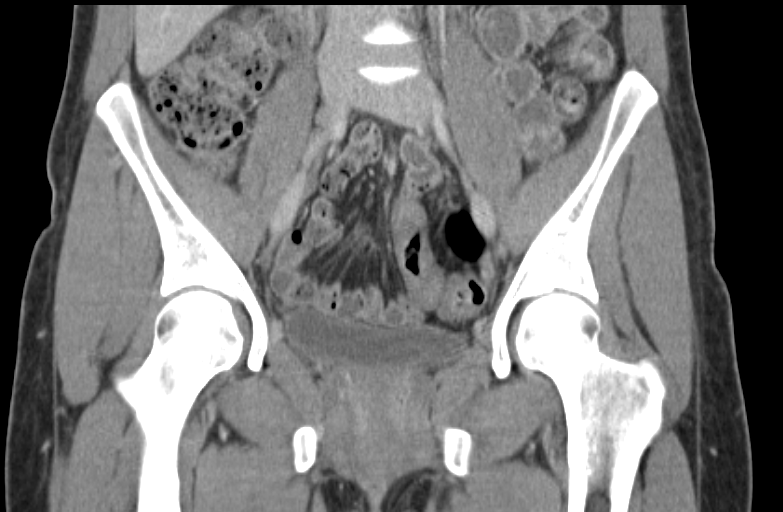
[im 34/62  soft-tissue]
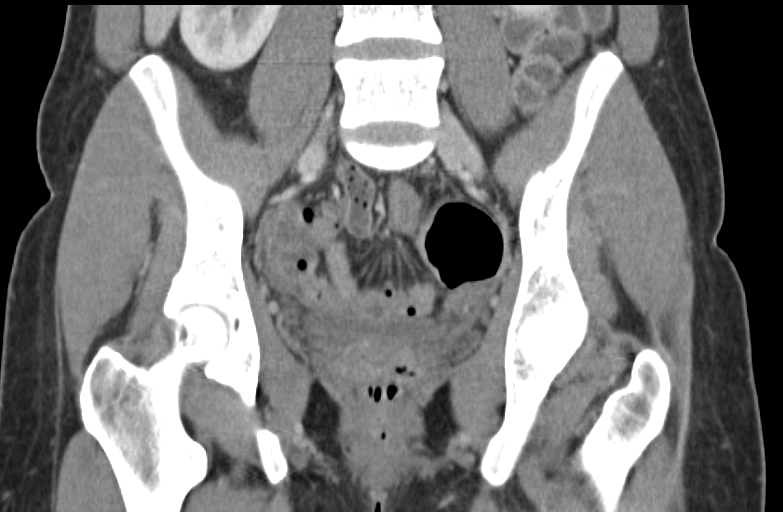

[16 of 46 positions shown; findings below may reference images not displayed]

FINDINGS: The uterus is grossly unremarkable in appearance. No suspicious
adnexal masses are seen. Visualized small and large bowel loops are
grossly unremarkable. No significant free fluid is seen. No acute
findings are seen to explain the patient's symptoms.

The bladder is mildly distended and grossly unremarkable. No acute
osseous abnormalities are seen.
IMPRESSION: Unremarkable contrast-enhanced CT of the pelvis.

## 2017-05-19 ENCOUNTER — Encounter: Payer: Self-pay | Admitting: *Deleted

## 2017-05-29 IMAGING — US US OB COMP LESS 14 WK
1 series · 14 of 28 positions shown · non-contrast
Comparison: None.

CLINICAL DATA: Pelvic pain for 5 days

EXAM:
OBSTETRIC <14 WK US AND TRANSVAGINAL OB US
TECHNIQUE: Both transabdominal and transvaginal ultrasound examinations were
performed for complete evaluation of the gestation as well as the
maternal uterus, adnexal regions, and pelvic cul-de-sac.
Transvaginal technique was performed to assess early pregnancy.

[Series 1: us ob comp less 14 wk · 0.28mm/px · 14 of 53 slices shown]
[im 2/53]
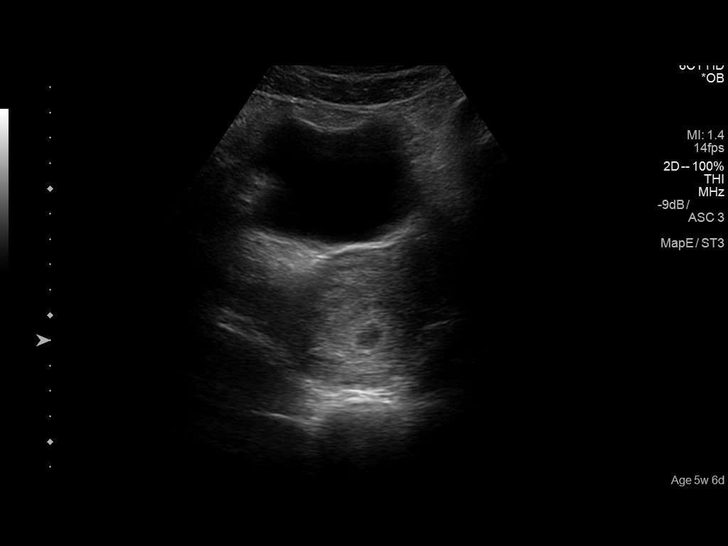
[im 6/53]
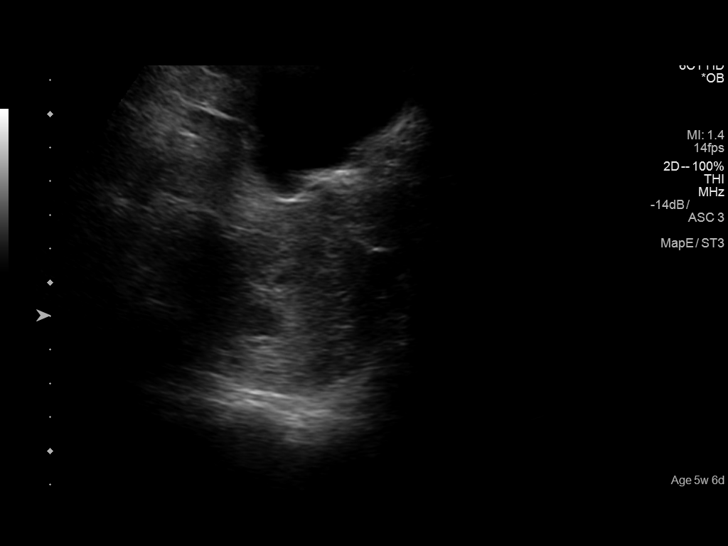
[im 10/53]
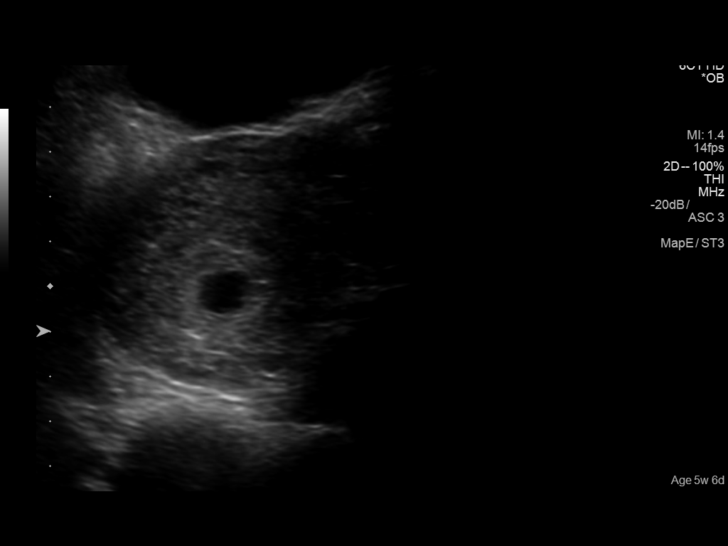
[im 14/53]
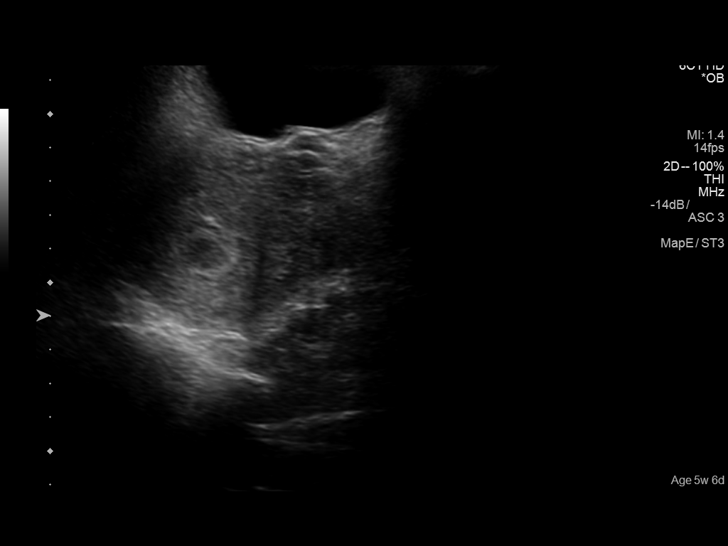
[im 18/53]
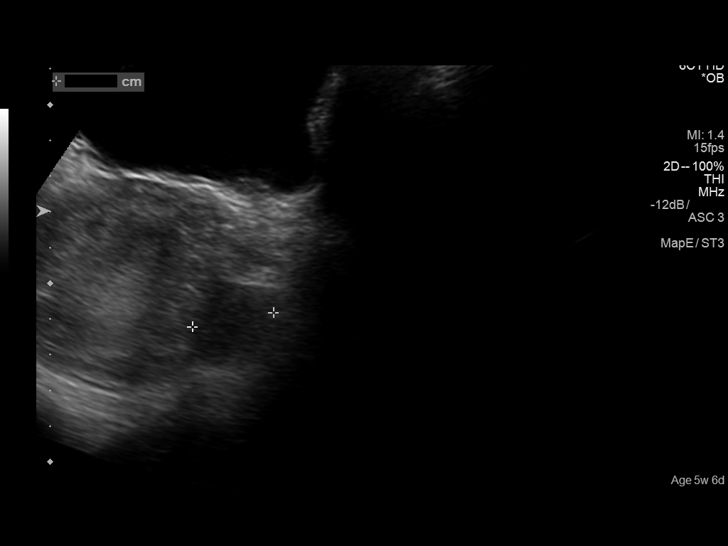
[im 22/53]
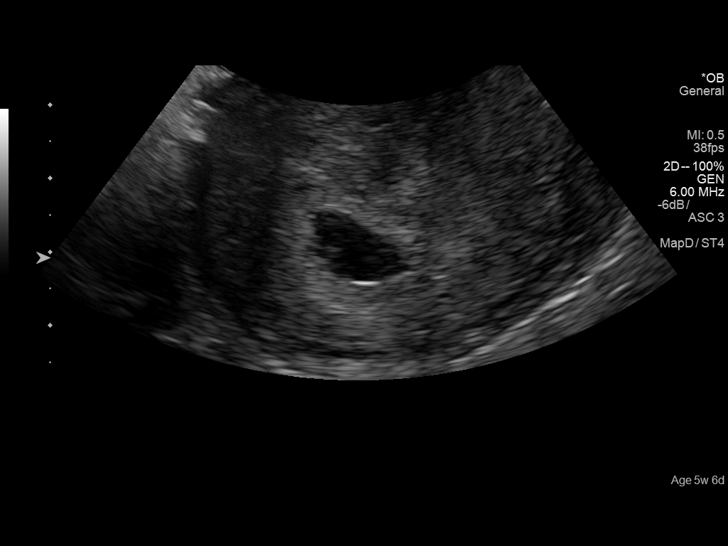
[im 26/53]
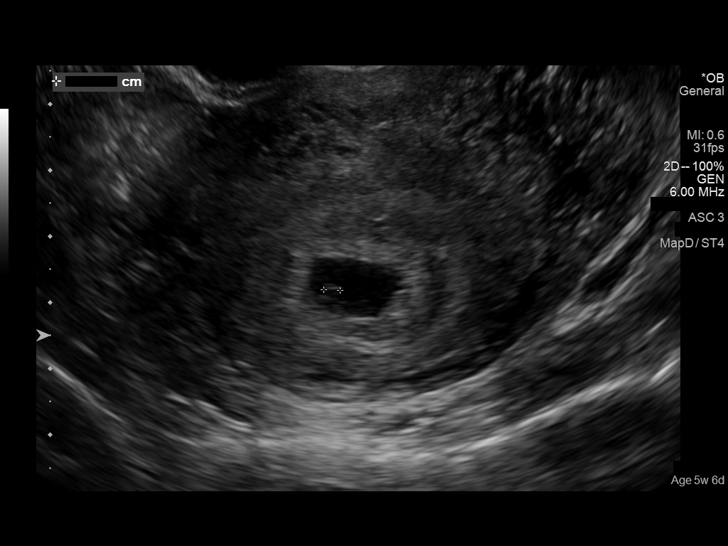
[im 29/53]
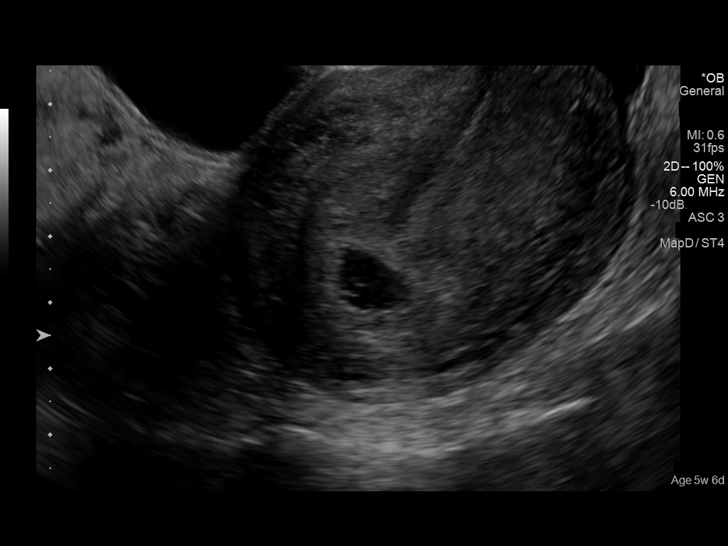
[im 33/53]
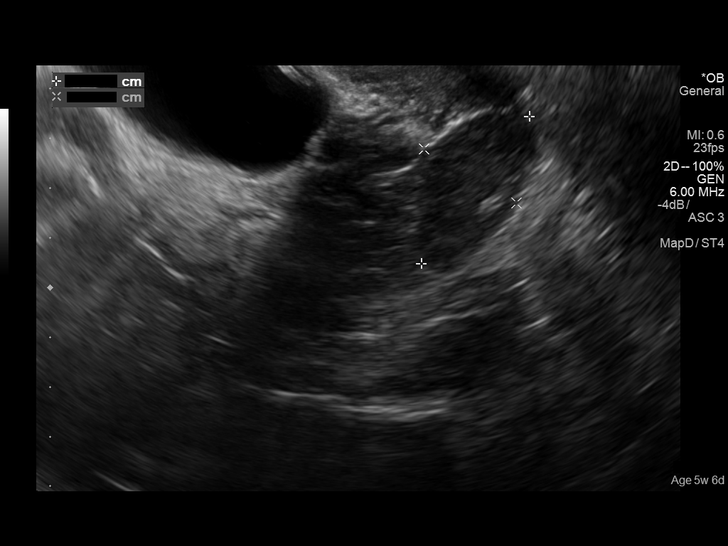
[im 37/53]
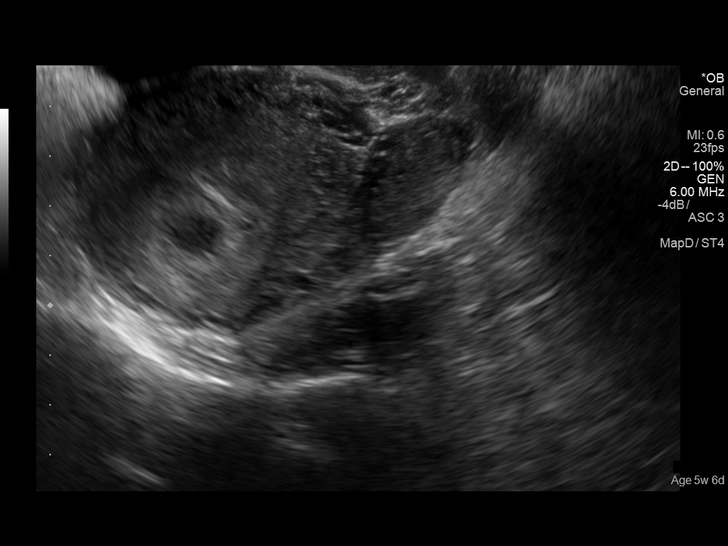
[im 41/53]
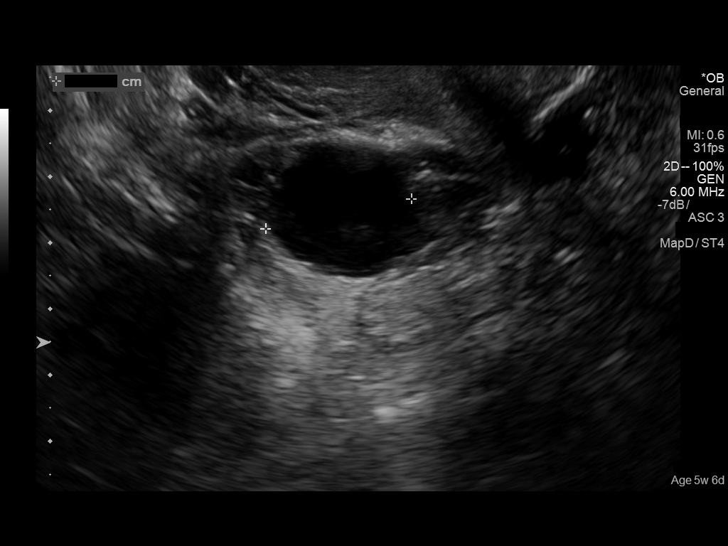
[im 45/53]
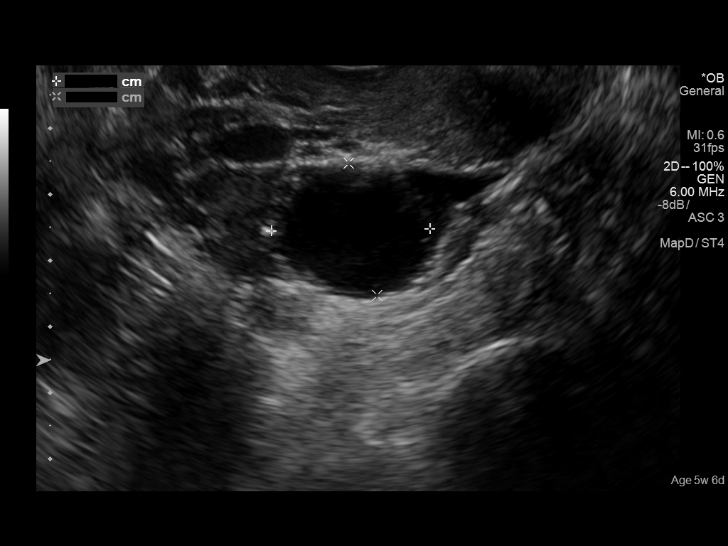
[im 49/53]
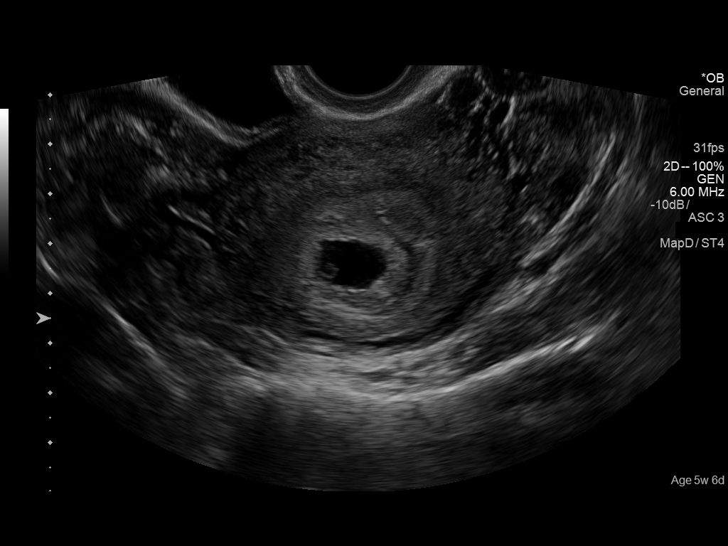
[im 53/53]
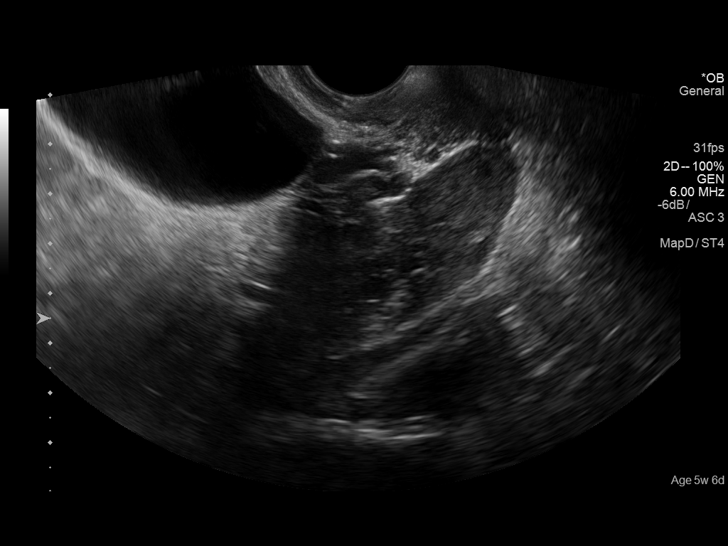

[14 of 28 positions shown; findings below may reference images not displayed]

FINDINGS: Intrauterine gestational sac: Single

Yolk sac:  Yes

Embryo:  None

Cardiac Activity: None

Heart Rate:   bpm

MSD: 12  mm   6 w   0  d

CRL:    mm    w    d                  US EDC:

Subchorionic hemorrhage:  None visualized.

Maternal uterus/adnexae: Normal ovaries.  Trace free pelvic fluid.
IMPRESSION: Early intrauterine gestational sac with yolk sac, but not fetal pole
or cardiac activity yet visualized. Recommend follow-up quantitative
B-HCG levels and follow-up US in 14 days to confirm and assess
viability. This recommendation follows SRU consensus guidelines:
Diagnostic Criteria for Nonviable Pregnancy Early in the First
Trimester. N Engl J Med 2819; [DATE].

## 2017-08-06 ENCOUNTER — Encounter
